# Patient Record
Sex: Female | Born: 1935 | Race: White | Hispanic: No | Smoking: Never smoker
Health system: Southern US, Community
[De-identification: ages and names within clinical notes are randomized; demographics above are authoritative.]

## PROBLEM LIST (undated history)

## (undated) DIAGNOSIS — E669 Obesity, unspecified: Secondary | ICD-10-CM

## (undated) DIAGNOSIS — H3530 Unspecified macular degeneration: Secondary | ICD-10-CM

## (undated) DIAGNOSIS — N3281 Overactive bladder: Secondary | ICD-10-CM

## (undated) DIAGNOSIS — C50919 Malignant neoplasm of unspecified site of unspecified female breast: Secondary | ICD-10-CM

## (undated) DIAGNOSIS — I10 Essential (primary) hypertension: Secondary | ICD-10-CM

## (undated) DIAGNOSIS — E538 Deficiency of other specified B group vitamins: Principal | ICD-10-CM

## (undated) DIAGNOSIS — E611 Iron deficiency: Principal | ICD-10-CM

## (undated) DIAGNOSIS — C801 Malignant (primary) neoplasm, unspecified: Secondary | ICD-10-CM

## (undated) DIAGNOSIS — N189 Chronic kidney disease, unspecified: Secondary | ICD-10-CM

## (undated) DIAGNOSIS — E119 Type 2 diabetes mellitus without complications: Secondary | ICD-10-CM

## (undated) HISTORY — DX: Type 2 diabetes mellitus without complications: E11.9

## (undated) HISTORY — DX: Unspecified macular degeneration: H35.30

## (undated) HISTORY — DX: Essential (primary) hypertension: I10

## (undated) HISTORY — DX: Overactive bladder: N32.81

## (undated) HISTORY — DX: Obesity, unspecified: E66.9

## (undated) HISTORY — PX: OTHER SURGICAL HISTORY: SHX169

## (undated) HISTORY — DX: Chronic kidney disease, unspecified: N18.9

## (undated) HISTORY — PX: REPLACEMENT TOTAL KNEE BILATERAL: SUR1225

## (undated) HISTORY — DX: Deficiency of other specified B group vitamins: E53.8

## (undated) HISTORY — DX: Iron deficiency: E61.1

## (undated) HISTORY — DX: Malignant neoplasm of unspecified site of unspecified female breast: C50.919

## (undated) HISTORY — PX: SKIN CANCER EXCISION: SHX779

## (undated) HISTORY — DX: Malignant (primary) neoplasm, unspecified: C80.1

---

## 2000-06-12 ENCOUNTER — Encounter: Payer: Self-pay | Admitting: Family Medicine

## 2000-06-12 ENCOUNTER — Ambulatory Visit (HOSPITAL_COMMUNITY): Admission: RE | Admit: 2000-06-12 | Discharge: 2000-06-12 | Payer: Self-pay | Admitting: Family Medicine

## 2000-09-01 ENCOUNTER — Other Ambulatory Visit: Admission: RE | Admit: 2000-09-01 | Discharge: 2000-09-01 | Payer: Self-pay | Admitting: Family Medicine

## 2001-02-24 ENCOUNTER — Encounter: Payer: Self-pay | Admitting: *Deleted

## 2001-03-02 ENCOUNTER — Inpatient Hospital Stay (HOSPITAL_COMMUNITY): Admission: RE | Admit: 2001-03-02 | Discharge: 2001-03-06 | Payer: Self-pay | Admitting: *Deleted

## 2001-03-30 ENCOUNTER — Encounter: Admission: RE | Admit: 2001-03-30 | Discharge: 2001-04-20 | Payer: Self-pay | Admitting: *Deleted

## 2001-06-14 ENCOUNTER — Encounter: Payer: Self-pay | Admitting: Family Medicine

## 2001-06-14 ENCOUNTER — Ambulatory Visit (HOSPITAL_COMMUNITY): Admission: RE | Admit: 2001-06-14 | Discharge: 2001-06-14 | Payer: Self-pay | Admitting: Family Medicine

## 2002-07-20 ENCOUNTER — Ambulatory Visit (HOSPITAL_COMMUNITY): Admission: RE | Admit: 2002-07-20 | Discharge: 2002-07-20 | Payer: Self-pay | Admitting: Family Medicine

## 2002-07-20 ENCOUNTER — Encounter: Payer: Self-pay | Admitting: Family Medicine

## 2003-04-04 ENCOUNTER — Inpatient Hospital Stay (HOSPITAL_COMMUNITY): Admission: RE | Admit: 2003-04-04 | Discharge: 2003-04-09 | Payer: Self-pay | Admitting: Orthopedic Surgery

## 2003-05-09 ENCOUNTER — Encounter: Admission: RE | Admit: 2003-05-09 | Discharge: 2003-06-07 | Payer: Self-pay | Admitting: Orthopedic Surgery

## 2003-08-08 ENCOUNTER — Ambulatory Visit (HOSPITAL_COMMUNITY): Admission: RE | Admit: 2003-08-08 | Discharge: 2003-08-08 | Payer: Self-pay | Admitting: Family Medicine

## 2004-08-13 ENCOUNTER — Ambulatory Visit (HOSPITAL_COMMUNITY): Admission: RE | Admit: 2004-08-13 | Discharge: 2004-08-13 | Payer: Self-pay | Admitting: Family Medicine

## 2004-08-28 ENCOUNTER — Ambulatory Visit (HOSPITAL_COMMUNITY): Admission: RE | Admit: 2004-08-28 | Discharge: 2004-08-28 | Payer: Self-pay | Admitting: Family Medicine

## 2005-08-29 ENCOUNTER — Ambulatory Visit (HOSPITAL_COMMUNITY): Admission: RE | Admit: 2005-08-29 | Discharge: 2005-08-29 | Payer: Self-pay | Admitting: Family Medicine

## 2005-09-05 ENCOUNTER — Encounter (INDEPENDENT_AMBULATORY_CARE_PROVIDER_SITE_OTHER): Payer: Self-pay | Admitting: Specialist

## 2005-09-05 ENCOUNTER — Encounter (INDEPENDENT_AMBULATORY_CARE_PROVIDER_SITE_OTHER): Payer: Self-pay | Admitting: Diagnostic Radiology

## 2005-09-05 ENCOUNTER — Encounter: Admission: RE | Admit: 2005-09-05 | Discharge: 2005-09-05 | Payer: Self-pay | Admitting: Family Medicine

## 2005-09-17 ENCOUNTER — Encounter: Admission: RE | Admit: 2005-09-17 | Discharge: 2005-09-17 | Payer: Self-pay | Admitting: Family Medicine

## 2005-10-15 ENCOUNTER — Encounter: Admission: RE | Admit: 2005-10-15 | Discharge: 2005-10-15 | Payer: Self-pay | Admitting: General Surgery

## 2005-10-20 ENCOUNTER — Encounter (INDEPENDENT_AMBULATORY_CARE_PROVIDER_SITE_OTHER): Payer: Self-pay | Admitting: General Surgery

## 2005-10-20 ENCOUNTER — Encounter (INDEPENDENT_AMBULATORY_CARE_PROVIDER_SITE_OTHER): Payer: Self-pay | Admitting: Specialist

## 2005-10-20 ENCOUNTER — Encounter: Admission: RE | Admit: 2005-10-20 | Discharge: 2005-10-20 | Payer: Self-pay | Admitting: General Surgery

## 2005-10-20 ENCOUNTER — Ambulatory Visit (HOSPITAL_BASED_OUTPATIENT_CLINIC_OR_DEPARTMENT_OTHER): Admission: RE | Admit: 2005-10-20 | Discharge: 2005-10-20 | Payer: Self-pay | Admitting: General Surgery

## 2005-11-11 ENCOUNTER — Encounter (HOSPITAL_COMMUNITY): Admission: RE | Admit: 2005-11-11 | Discharge: 2005-12-11 | Payer: Self-pay | Admitting: Oncology

## 2005-11-11 ENCOUNTER — Encounter: Admission: RE | Admit: 2005-11-11 | Discharge: 2005-11-11 | Payer: Self-pay | Admitting: Oncology

## 2005-11-11 ENCOUNTER — Ambulatory Visit (HOSPITAL_COMMUNITY): Payer: Self-pay | Admitting: Oncology

## 2005-12-10 ENCOUNTER — Ambulatory Visit (HOSPITAL_COMMUNITY): Admission: RE | Admit: 2005-12-10 | Discharge: 2005-12-10 | Payer: Self-pay | Admitting: Radiation Oncology

## 2006-05-13 ENCOUNTER — Ambulatory Visit (HOSPITAL_COMMUNITY): Payer: Self-pay | Admitting: Oncology

## 2006-06-29 ENCOUNTER — Emergency Department (HOSPITAL_COMMUNITY): Admission: EM | Admit: 2006-06-29 | Discharge: 2006-06-29 | Payer: Self-pay | Admitting: Emergency Medicine

## 2006-07-07 ENCOUNTER — Ambulatory Visit (HOSPITAL_COMMUNITY): Admission: RE | Admit: 2006-07-07 | Discharge: 2006-07-07 | Payer: Self-pay | Admitting: Family Medicine

## 2006-07-27 ENCOUNTER — Ambulatory Visit (HOSPITAL_COMMUNITY): Admission: RE | Admit: 2006-07-27 | Discharge: 2006-07-27 | Payer: Self-pay | Admitting: Family Medicine

## 2006-08-31 ENCOUNTER — Encounter: Admission: RE | Admit: 2006-08-31 | Discharge: 2006-08-31 | Payer: Self-pay | Admitting: Family Medicine

## 2006-11-11 ENCOUNTER — Ambulatory Visit (HOSPITAL_COMMUNITY): Payer: Self-pay | Admitting: Oncology

## 2006-11-11 ENCOUNTER — Encounter (HOSPITAL_COMMUNITY): Admission: RE | Admit: 2006-11-11 | Discharge: 2006-12-11 | Payer: Self-pay | Admitting: Oncology

## 2007-05-05 ENCOUNTER — Ambulatory Visit (HOSPITAL_COMMUNITY): Payer: Self-pay | Admitting: Oncology

## 2007-09-06 ENCOUNTER — Encounter (HOSPITAL_COMMUNITY): Admission: RE | Admit: 2007-09-06 | Discharge: 2007-10-06 | Payer: Self-pay | Admitting: Oncology

## 2007-10-01 ENCOUNTER — Encounter: Admission: RE | Admit: 2007-10-01 | Discharge: 2007-10-01 | Payer: Self-pay | Admitting: General Surgery

## 2007-11-03 ENCOUNTER — Encounter (HOSPITAL_COMMUNITY): Admission: RE | Admit: 2007-11-03 | Discharge: 2007-12-03 | Payer: Self-pay | Admitting: Oncology

## 2007-11-03 ENCOUNTER — Ambulatory Visit (HOSPITAL_COMMUNITY): Payer: Self-pay | Admitting: Oncology

## 2008-04-21 ENCOUNTER — Ambulatory Visit (HOSPITAL_BASED_OUTPATIENT_CLINIC_OR_DEPARTMENT_OTHER): Admission: RE | Admit: 2008-04-21 | Discharge: 2008-04-21 | Payer: Self-pay | Admitting: Urology

## 2008-05-03 ENCOUNTER — Encounter (HOSPITAL_COMMUNITY): Admission: RE | Admit: 2008-05-03 | Discharge: 2008-06-02 | Payer: Self-pay | Admitting: Oncology

## 2008-05-03 ENCOUNTER — Ambulatory Visit (HOSPITAL_COMMUNITY): Payer: Self-pay | Admitting: Oncology

## 2008-09-07 ENCOUNTER — Ambulatory Visit (HOSPITAL_COMMUNITY): Admission: RE | Admit: 2008-09-07 | Discharge: 2008-09-07 | Payer: Self-pay | Admitting: Oncology

## 2008-11-01 ENCOUNTER — Ambulatory Visit (HOSPITAL_COMMUNITY): Payer: Self-pay | Admitting: Oncology

## 2009-04-19 ENCOUNTER — Ambulatory Visit (HOSPITAL_BASED_OUTPATIENT_CLINIC_OR_DEPARTMENT_OTHER): Admission: RE | Admit: 2009-04-19 | Discharge: 2009-04-20 | Payer: Self-pay | Admitting: Urology

## 2009-05-22 ENCOUNTER — Ambulatory Visit (HOSPITAL_COMMUNITY): Payer: Self-pay | Admitting: Internal Medicine

## 2009-09-12 ENCOUNTER — Ambulatory Visit (HOSPITAL_COMMUNITY): Admission: RE | Admit: 2009-09-12 | Discharge: 2009-09-12 | Payer: Self-pay | Admitting: Family Medicine

## 2009-11-20 ENCOUNTER — Ambulatory Visit (HOSPITAL_COMMUNITY): Payer: Self-pay | Admitting: Oncology

## 2009-11-20 ENCOUNTER — Encounter (HOSPITAL_COMMUNITY): Admission: RE | Admit: 2009-11-20 | Payer: Self-pay | Admitting: Oncology

## 2010-04-03 LAB — POCT I-STAT 4, (NA,K, GLUC, HGB,HCT)
HCT: 43 % (ref 36.0–46.0)
Hemoglobin: 14.6 g/dL (ref 12.0–15.0)
Potassium: 4.5 mEq/L (ref 3.5–5.1)
Sodium: 142 mEq/L (ref 135–145)

## 2010-04-03 LAB — HEMOGLOBIN AND HEMATOCRIT, BLOOD: HCT: 33.6 % — ABNORMAL LOW (ref 36.0–46.0)

## 2010-04-03 LAB — GLUCOSE, CAPILLARY
Glucose-Capillary: 154 mg/dL — ABNORMAL HIGH (ref 70–99)
Glucose-Capillary: 243 mg/dL — ABNORMAL HIGH (ref 70–99)

## 2010-04-24 LAB — POCT I-STAT 4, (NA,K, GLUC, HGB,HCT)
Glucose, Bld: 128 mg/dL — ABNORMAL HIGH (ref 70–99)
Hemoglobin: 14.3 g/dL (ref 12.0–15.0)
Potassium: 4.3 mEq/L (ref 3.5–5.1)
Sodium: 142 mEq/L (ref 135–145)

## 2010-04-24 LAB — HEMATOCRIT: HCT: 34.7 % — ABNORMAL LOW (ref 36.0–46.0)

## 2010-05-21 ENCOUNTER — Other Ambulatory Visit (HOSPITAL_COMMUNITY): Payer: Self-pay | Admitting: Oncology

## 2010-05-21 ENCOUNTER — Encounter (HOSPITAL_COMMUNITY): Payer: Medicare Other | Attending: Oncology

## 2010-05-21 DIAGNOSIS — R160 Hepatomegaly, not elsewhere classified: Secondary | ICD-10-CM | POA: Insufficient documentation

## 2010-05-21 DIAGNOSIS — Z79899 Other long term (current) drug therapy: Secondary | ICD-10-CM | POA: Insufficient documentation

## 2010-05-21 DIAGNOSIS — C50919 Malignant neoplasm of unspecified site of unspecified female breast: Secondary | ICD-10-CM | POA: Insufficient documentation

## 2010-05-21 DIAGNOSIS — E7800 Pure hypercholesterolemia, unspecified: Secondary | ICD-10-CM | POA: Insufficient documentation

## 2010-05-21 DIAGNOSIS — I10 Essential (primary) hypertension: Secondary | ICD-10-CM | POA: Insufficient documentation

## 2010-05-21 LAB — COMPREHENSIVE METABOLIC PANEL
ALT: 64 U/L — ABNORMAL HIGH (ref 0–35)
Alkaline Phosphatase: 78 U/L (ref 39–117)
BUN: 29 mg/dL — ABNORMAL HIGH (ref 6–23)
CO2: 25 mEq/L (ref 19–32)
GFR calc non Af Amer: 48 mL/min — ABNORMAL LOW (ref >60)
Glucose, Bld: 143 mg/dL — ABNORMAL HIGH (ref 70–99)
Potassium: 4.2 mEq/L (ref 3.5–5.1)
Total Protein: 8 g/dL (ref 6.0–8.3)

## 2010-05-21 LAB — CBC
HCT: 37.1 % (ref 36.0–46.0)
Hemoglobin: 12.4 g/dL (ref 12.0–15.0)
MCHC: 33.4 g/dL (ref 30.0–36.0)
MCV: 95.6 fL (ref 78.0–100.0)
RDW: 14.2 % (ref 11.5–15.5)
WBC: 9.8 10*3/uL (ref 4.0–10.5)

## 2010-05-23 ENCOUNTER — Other Ambulatory Visit: Payer: Self-pay | Admitting: Oncology

## 2010-05-23 DIAGNOSIS — C799 Secondary malignant neoplasm of unspecified site: Secondary | ICD-10-CM

## 2010-05-23 DIAGNOSIS — R52 Pain, unspecified: Secondary | ICD-10-CM

## 2010-05-23 DIAGNOSIS — R748 Abnormal levels of other serum enzymes: Secondary | ICD-10-CM

## 2010-05-23 DIAGNOSIS — R110 Nausea: Secondary | ICD-10-CM

## 2010-05-23 DIAGNOSIS — C50919 Malignant neoplasm of unspecified site of unspecified female breast: Secondary | ICD-10-CM

## 2010-05-28 ENCOUNTER — Ambulatory Visit (HOSPITAL_COMMUNITY)
Admission: RE | Admit: 2010-05-28 | Discharge: 2010-05-28 | Disposition: A | Discharge status: Home / Self Care | Payer: Medicare Other | Source: Ambulatory Visit | Attending: Oncology | Admitting: Oncology

## 2010-05-28 DIAGNOSIS — K838 Other specified diseases of biliary tract: Secondary | ICD-10-CM | POA: Insufficient documentation

## 2010-05-28 DIAGNOSIS — R110 Nausea: Secondary | ICD-10-CM

## 2010-05-28 DIAGNOSIS — R160 Hepatomegaly, not elsewhere classified: Secondary | ICD-10-CM | POA: Insufficient documentation

## 2010-05-28 DIAGNOSIS — C50919 Malignant neoplasm of unspecified site of unspecified female breast: Secondary | ICD-10-CM

## 2010-05-28 DIAGNOSIS — K7689 Other specified diseases of liver: Secondary | ICD-10-CM | POA: Insufficient documentation

## 2010-05-28 DIAGNOSIS — R748 Abnormal levels of other serum enzymes: Secondary | ICD-10-CM | POA: Insufficient documentation

## 2010-05-28 NOTE — Op Note (Signed)
NAME:  Anna Dickerson, Anna Dickerson            ACCOUNT NO.:  [id number]   MEDICAL RECORD NO.:  [id number]          PATIENT TYPE:  AMB   LOCATION:  [facility]                         FACILITY:  [facility]   PHYSICIAN:  Sigmund I. Gaynelle Arabian, M.D.DATE OF BIRTH:  November 20, 1929   DATE OF PROCEDURE:  04/21/2008  DATE OF DISCHARGE:                               OPERATIVE REPORT   PREOPERATIVE DIAGNOSIS:  Pelvic floor prolapse.   POSTOPERATIVE DIAGNOSIS:  Pelvic floor prolapse.   PROCEDURE PERFORMED:  1. Cystourethroscopy, bilateral retrograde pyelogram, bilateral      externalized urethral stent placement.  2. Suprapubic tube placement.  3. Pelvic floor reconstruction using [employer].  4. Cystourethroscopy and injection of Macroplastique.   SURGEON:  Sigmund I. Gaynelle Arabian, M.D.   ASSISTANT:  1. Simmie Davies, MD.  2. Franco Collet, NP.   ANESTHESIA:  General.   DRAINS:  Bonanno suprapubic tube.   PACKING:  Two vaginal packs with Estrace cream.   ESTIMATED BLOOD LOSS:  About 300 mL.   INDICATIONS FOR PROCEDURE:  The patient is a 77 year old female who was  evaluated by Pierre Bali I. Gaynelle Arabian, M.D. in the urology clinic for  complaints of pelvic floor prolapse.  The different treatment options  were discussed.  The patient opted for pelvic floor reconstruction.  The  risks and benefits of the procedure were explained and informed consent  obtained.   DESCRIPTION OF PROCEDURE IN DETAIL:  The patient was brought to the  operating room.  Placed in the supine position.  Administered general  anesthesia by the anesthesia team.  A proper time-out performed  confirming the patient, procedure and the site.  The patient was  subsequently placed in the dorsal lithotomy position.  The pressure  points were well-padded.  Bilateral lower extremity SCDs were applied.  The patient was prepped and draped in the usual sterile manner.  We  began the procedure by performing cystourethroscopy.  Of  note, the  patient had severe pelvic floor prolapse with cervix at the introitus.  On performing cystourethroscopy, given no abnormalities noticed in the  bladder, the right and left ureteral orifices were identified and end-  hole catheters were advanced and retrograde pyelogram done bilaterally.  There was no abnormality noted on the retrograde pyelogram.  Under  fluoroscopy a 5-French end-hole catheter was advanced into the right  renal pelvis and a 6-French end-hole catheter into the left renal  pelvis.  We then placed a Foley catheter.  Of note, that we then under  endoscopic vision placed a Bonanno suprapubic tube.  This was secured at  the skin level using 3-0 nylon suture.  We then removed the cystoscope  and placed a 16-French Foley catheter.  We secured the end-hole catheter  to the Foley catheter.  We then applied [facility] retractor and exposed  the vaginal mucosa.  We made an inverted V incision proximal to the mid  urethra.  We then raised the vaginal flaps using Strully scissors.  With  blunt and sharp dissection we were able to reduce the cystocele and  laterally we were able to dissect up to the ischial spine  bilaterally.  We then used 2-0 Vicryl suture and performed Kelly plication across  approximating the pubocervical fascia.  We then used Capio device and  placed the sutures across the sacrospinous ligaments bilaterally.  We  then secured the mesh in the midline around the cystocele by using 2-0  Vicryl suture.  These were secured proximally and distally at three  different places.  We then tightened the kit around the sacrospinous  ligament.  We then released the plastic covering over the mesh.  We then  trimmed the excess vaginal mucosa and repaired the vaginal mucosa using  2-0 Vicryl suture in a running manner.  Of note, that we had secured  hemostasis with where applicable using Bovie electrocautery and 2-0  Vicryl suture.  At the time of closure no active  bleeding was seen.  Of  note, we had placed Avitene into the wound before closing the vaginal  mucosa.  We then placed two vaginal packs with Estrace cream into the  vagina.  We then again performed cystourethroscopy after removing the  Foley catheter and bilateral externalized stents.  Under endoscopic  vision we then injected Macroplastique injection 1 cm distal to the  bladder neck area between 5 o'clock and 7 o'clock positions.  This  marked the end of the procedure.  The patient's bladder was left to  straight drain through the suprapubic tube.  The patient was  subsequently extubated and transferred in stable condition to the  recovery room.   Of note, Sigmund I. Gaynelle Arabian, M.D. was present for entirety of the  case.     ______________________________  Simmie Davies, MD      Sigmund I. Gaynelle Arabian, M.D.  Electronically Signed    JJ/MEDQ  D:  04/21/2008  T:  04/21/2008  Job:  [id number]

## 2010-05-31 NOTE — Op Note (Signed)
[place]  Patient:    Anna Dickerson, Anna Dickerson Visit Number: [id number] MRN: [id number]          Service Type: SUR Location: RCRM 2550 [id number] Attending Physician:  Marlon Pel Dictated by:   Hedda Slade, M.D. Admit Date:  03/02/2001                             Operative Report  PREOPERATIVE DIAGNOSIS:  Bilateral osteoarthritis of knees with varus.  POSTOPERATIVE DIAGNOSIS:  Bilateral osteoarthritis of knees with varus.  OPERATIVE PROCEDURE:  Left total knee arthroplasty, Osteonics type, with cementing of all components.  ANESTHESIA:  General by endotracheal tube.  SURGEON:  Hedda Slade, M.D.  ASSISTANT:  Meredith Pel, M.D.  [place]:  An IV was started.  The patient was given 1 g of Ancef IV.  She was then brought to the operating room, placed in a comfortable position, all areas padded, a roll put under the left hip.  She was given general anesthetic by endotracheal tube.  In her IV she had already been given 1 g of Ancef.  A Foley catheter was put in place.  A proximal pneumatic tourniquet was applied then isolated with U-drape and she was prepped from the edges of the drape to her toes with DuraPrep then draped in the usual manner, this including two Iobans.  A straight, slightly oblique incision was made proximal to distal, ending up just medial to the patellar tendon.  The quadriceps tendon was cut, leaving a small medial rim, and the retinaculum opened medially and this incision continued down to along the medial patellar tendon, thus allowing the patella to be everted.  But prior to that, the lateral patellofemoral ligament was divided, along with a couple of bands of synovium, then the patella everted, dislocated laterally, the knee flexed.  A huge volume of osteophytes was removed from the distal femur.  A large part of the fat pad was removed.  Subperiosteally, the capsule in the medial ligaments  was elevated, then removed posteriorly to the semimembranosus, and while doing this I also did a medial meniscectomy.  Large osteophytes were removed from the peripheral medially.  Attention was then directed laterally.  Likewise, this was sharply exposed. The lateral meniscus was excised; the popliteus was preserved.  At this point the distal femur was resected 12 mm in 5 degrees of valgus using intermedullary guide.  The cruciate ligaments were sacrificed and removed.  The distal femur was then measured to be a size 9.  We then used the guides in the transepicondylar line to orient the cutting block in 3 to 3.5 degrees of external rotation.  Using the cutting block, all the chamfers were cut.  We then used the block to cut the femoral notch and at this point used some pieces of chamfer bone to put in the intermedullary rod hole to block some bone bleeding.  The tibia was subluxed forward.  We sized it to be #7.  We then used the intermedullary rod and resection guide to resect 10 mm from the high or lateral tibial plateau with 5 degree posterior tilt.  This was oriented anterior to posterior.  Having then removed the proximal tibia, a large amount of osteophytes were removed from the popliteal area and from back of the femur.  Using guides and trials we determined that the #9 femoral component, the #7 tibial tray, and a  12 mm bearing insert were indicated.  This allowed full flexion, extension, and excellent stability in flexion and extension.  We then marked the rotation of the tibial tray and with that and the tibial guide, we used the impacting fin to accept the tibial tray fin.  We used a pulsatile lavage, copiously irrigated the area.  The patella measured 25 mm in thickness.  We resected it to about 12 mm in thickness and determined that size #7 patellar button was adequate, then using the guide, the patella was centered, being slightly medialized.  Peg holes were drilled  and at this point we were ready to cement the components.  The area was completely pulsatile lavaged again.  A #9 femoral component was well cemented all the way around, as was the #7 tibial tray.  The trial tibial bearing was put in place and the knee extended while this cement set, and attention was directed to the patella.  Cement was pressed into the cancellous bone and applied to the prosthesis, then clamped completely reduced.  Excess cement was removed and we just spent time looking for bits of debris and pulsatile lavaged until all the cement hardened.  At this point then we removed the tibial bearing, sought out and removed bits of excess and loose cement, and when we were confident that all debris and cement was removed, we again pulsatile lavaged then put in the permanent 12 mm thick tibial bearing insert that locked into position securely.  This allowed excellent flexion, full extension, and varus-valgus stability.  The patella tracked very nicely without any excess tension throughout.  With the tourniquet down, there was a branch of the medial inferior geniculate artery which was bleeding.  This was Bovie cauterized.  A couple of veins were likewise Bovie cauterized and otherwise the wound was quite dry.  We then closed the retinaculum using figure-of-eight sutures of #1 Vicryl.  Then, in the more superficial layers use 2-0 Vicryl and on the skin used metal staples, then applied Xeroform, 4x4s, ABDs, and Kerlix, followed by Ace wrap.  One suction Hemovac was placed superficial to the retinaculum and it was brought out superolaterally through a skin puncture.  Knee immobilizer was applied.  At that point the anesthesiologist was ready to do a femoral block.  Estimated blood loss during this procedure was perhaps 300 cc; none was replaced. Dictated by:   Hedda Slade, M.D. Attending Physician:  Marlon Pel DD:  03/02/01 TD:  03/02/01 Job: [id number]

## 2010-05-31 NOTE — H&P (Signed)
[place]  Patient:    Anna Dickerson, Anna Dickerson Visit Number: [id number] MRN: [id number]          Service Type: Attending:  Hedda Slade, M.D. Dictated by:   Hedda Slade, M.D. Adm. Date:  03/02/01                           History and Physical  INTRODUCTION:  Ms. [place] is a 77 year old lady with bilateral painful knees, left much greater than right.  This has been progressive over years and at this point she relates that she is getting to where "I can hardly walk."  As an outpatient she was evaluated, found to have severe varus deformity, with posterolateral thrust and limited motion, and found to be a good candidate for total knee arthroplasty.  This was fully discussed with her.  We discussed problems with infection, pain, phlebitis, instability, and the fact of no guarantees, but a good chance she will do well.  She would like to proceed on with, initially, left total knee arthroplasty.  MEDICATIONS: 1. She was on an aspirin a day and Voltaren, but she has discontinued that in    preparation for surgery. 2. Capoten 2 mg a day. 3. Hydrochlorothiazide 25 mg one q.d. 4. Lorazepam 0.5 g one q.4h. 5. Lipitor 20 mg q.d. 6. Paxil 20 mg one q.d.  PAST SURGICAL HISTORY:  She had a left knee arthroscopic meniscectomy.  FAMILY HISTORY:  Positive for cancer and high blood pressure.  REVIEW OF SYSTEMS:  Patient has high blood pressure.  She relates she has no significant cardiorespiratory, GI, or GU symptoms.  She had last Pap smear and mammogram in June 2002.  PHYSICAL EXAMINATION:  VITAL SIGNS:  Temperature 99.3, pulse 82, respirations 19, blood pressure 180/88.  HEENT:  She is wearing eyeglasses.  Extraocular movements full.  She has an upper denture, nine lower teeth.  The mucosa appears normal.  Tympanic membranes are both occluded with cerumen.  NECK:  Moves freely without apparent pain.  There are no palpable masses. There are no  carotid bruits.  CHEST:  Decreased expansion, breath sounds clear.  HEART:  Regular rhythm, distant heart sounds, no murmur heard.  ABDOMEN:  Round, soft, nontender.  No palpable masses.  Bowel sounds are normal.  ORTHOPEDIC:  Has good dorsalis pedis pulse, good femoral pulse without bruit. Hips move freely without pain.  She walks with varus knees and posterolateral thrust.  The left knee motion is from about 15 to 90 degrees.  There is crepitation.  There is pain and tenderness medially.  There is 1-2+ laxity.  SKIN:  The skin is in good condition.  LABORATORY DATA:  Standing AP x-rays show varus deformity bilaterally, left greater than right in that there are subchondral cysts more left than right. Overall would appear to be a fairly good quality bone.  ADMITTING DIAGNOSES: 1. Bilateral osteoarthritis of the knees with varus deformity, relatively good    bone. 2. Hypertension. 3. Anxiety.  PLAN:  Left total knee arthroplasty with cementing of components.  I have fully discussed this with her.  She seems to understand and agree. Dictated by:   Hedda Slade, M.D. Attending:  Hedda Slade, M.D. DD:  02/24/01 TD:  02/24/01 Job: [id number]

## 2010-05-31 NOTE — Discharge Summary (Signed)
[place]  Patient:    Anna Dickerson, Anna Dickerson Visit Number: [id number] MRN: [id number]          Service Type: SUR Location: [id number] Attending Physician:  Marlon Pel Dictated by:   Hedda Slade, M.D. Admit Date:  03/02/2001 Discharge Date: 03/06/2001                             Discharge Summary  ADMITTING DIAGNOSIS:  Osteoarthritis with varus bilaterally.  DISCHARGE DIAGNOSIS:  Osteoarthritis with varus bilaterally.  OPERATIVE PROCEDURE:  March 02, 2001, left total knee arthroplasty using cemented Scorpio components, #9 femoral component, #7 tibial tray, 12-mm bearing insert, #7 patellar resurfacing component.  HISTORY OF PRESENT ILLNESS:  For history and physical, see that dictated on admission.  HOSPITAL COURSE:  On the date of admission, patient was taken to the operating room where her surgery was carried out as detailed in the operative note.  She was given preoperative, intraoperative and postoperative IV antibiotics.  She was started postoperatively on Coumadin protocol and early motion.  Her postoperative course was uneventful and she was discharged to home physical therapy on March 06, 2001.  She was 50% weightbearing.  Her INR was 1.7 with a goal of 2 and her last hemoglobin was 10.8.  While at home, she was to do frequent ankle pumps, quad sets, motion with physical therapy and by herself, take Tylox or Vicodin for pain and see me approximately 10 days postoperative.  LABORATORY AND ACCESSORY DATA:  Some of the laboratory data obtained during this admission:  Admitting hemoglobin was 14.1, discharge hemoglobin 10.8. EKG:  Normal sinus rhythm.  Routine electrolytes normal.  Routine urinalysis normal.  Blood type was O-positive. Dictated by:   Hedda Slade, M.D. Attending Physician:  Marlon Pel DD:  03/23/01 TD:  03/23/01 Job: [id number]

## 2010-05-31 NOTE — Op Note (Signed)
NAME:  Anna Dickerson, Anna Dickerson                      ACCOUNT NO.:  [id number]   MEDICAL RECORD NO.:  [id number]                   PATIENT TYPE:  INP   LOCATION:  5007                                 FACILITY:  [facility]   PHYSICIAN:  Anderson Malta, M.D.                 DATE OF BIRTH:  1929-05-04   DATE OF PROCEDURE:  04/04/2003  DATE OF DISCHARGE:                                 OPERATIVE REPORT   PREOPERATIVE DIAGNOSIS:  Right knee arthritis.   POSTOPERATIVE DIAGNOSIS:  Right knee arthritis.   PROCEDURE:  Right total knee replacement.   SURGEON:  Anderson Malta, M.D.   ASSISTANT:  Elayne Guerin, R.N., G.N.P.   ANESTHESIA:  General endotracheal plus preoperative femoral nerve block.   ESTIMATED BLOOD LOSS:  150 cc.   DRAINS:  None.   TOURNIQUET TIME:  Approximately one hour and 45 minutes at 350 mmHg.   COMPONENTS:  9 femur posterior stabilized cemented, 9 tibia cemented, 12.5  poly cross link poly and 7 patella cemented.   PROCEDURE IN DETAIL:  The patient was brought to the operating room where  general endotracheal anesthesia was induced.  Preoperative IV antibiotics  were administered.  The right leg and foot and knee were prepped with  Duraprep solution and draped in a sterile manner.  Charlie Pitter was used to cover  the operative field.  The leg was elevated and exsanguinated with the  Esmarch.  The tourniquet was inflated.  With the knee over a small bolster,  an anterior and longitudinal incision was made.  The skin and subcutaneous  tissues were sharply divided.  The median parapatellar approach was made.  The precise location of the median parapatellar arthrotomy was marked at the  superomedial aspect of the patella with the #1 Vicryl suture.  The fat pad  was partially excised from true visualization.  The lateral patellofemoral  ligament was removed.  Osteophytes were removed from the median and lateral  edge of the distal femur.  Soft tissue was removed from the anterior  distal  aspect of the femur for visualization of the anterior cut.  At this time,  the patella was everted and the knee was flexed.  Osteophytes were removed  peripherally from the tibial plateau.  The MCL was partially elevated for  ligament balancing.  At this time, the ACL and PCL were released.  Intramedullary alignment guide was placed.  The distal femoral cut of 12 mm  was made.  The external rotation sizing guide was then placed.  The external  rotation was placed parallel to the epicondylar axis.  The size 9 was deemed  to not notch the femoral cortex.  The chamfer cutting guide was placed and  the anterior, posterior and chamfer cuts were then made with the collateral  ligaments well protected.  At this time, a leveling cut on the tibia was  made.  The tibia was subluxated forward with the  posterior retractor.  The  intramedullary alignment was then used to make the tibial cut measuring 2 mm  off of the most affected medial side.  The resection was made.  The spacing  block was then placed in extension and flexion and found to be well balanced  with full extension, with slight hyperextension achievable.  At this time,  the box cut on the femur was made.  Remnant menisci and the PCL and ACL were  carefully resected.  The fissure osteophytes were then removed and soft  tissue was stripped off the posterior aspect of the knee carefully using a  blunt Cobb elevator.  At this time, the patella was repaired.  The patella  caliper measurement was 25 mm.  The 11 mm was resected and a size 7 patellar  button was noted, trial button was then placed after drilling of the three  pegs.  The trial components were placed.  Good patellar tracking was noted.  The knee achieved full extension with slight hyperextension and full flexion  with minimal lift off passed 100 degrees of flexion.  At this time, rotation  of the tibial component was marked and the tibial keel punch was performed.  At this  time, all trial components were removed.  The cut bony surfaces were  irrigated.  The components were then cemented into position beginning with  the 9 tibia, 9 femur and 7 patella.  Excess cement was removed.  The 10 and  12 poly trial was then placed and better stability was achieved with the 12  poly without sacrificing range of motion or patellar stability.  The 12 poly  was placed after cement hardening had occurred.  At this time, the  tourniquet was released.  Bleeding points encountered were controlled using  electrocautery.  The knee was then placed over the bolster and closed in  slight flexion using interrupted inverted #1 Ethibond suture followed by  interrupted, followed by 0 Vicryl interrupted suture to reapproximate the  deep tissue, interrupted inverted 2-0 Vicryl and then skin staples to  reapproximate the skin edges.  The patient was placed in a bulky dressing  and knee immobilizer.  She tolerated the procedure well without any  immediate complications.                                               Anderson Malta, M.D.    GSD/MEDQ  D:  04/05/2003  T:  04/06/2003  Job:  [id number]

## 2010-05-31 NOTE — Discharge Summary (Signed)
NAME:  Anna Dickerson, Anna Dickerson                      ACCOUNT NO.:  [id number]   MEDICAL RECORD NO.:  [id number]                   PATIENT TYPE:  INP   LOCATION:  5007                                 FACILITY:  [facility]   PHYSICIAN:  Anderson Malta, M.D.                 DATE OF BIRTH:  06-Nov-1929   DATE OF ADMISSION:  04/04/2003  DATE OF DISCHARGE:  04/09/2003                                 DISCHARGE SUMMARY   DISCHARGE DIAGNOSIS:  Right knee arthritis.   SECONDARY DIAGNOSIS:  Hypertension.   OPERATIONS AND NOTABLE PROCEDURES:  Right total knee replacement performed  April 04, 2003.   HOSPITAL COURSE:  Anna Dickerson is a 76 year old female with right knee  arthritis.  She underwent right total knee arthroplasty, April 04, 2003.  The patient tolerated the procedure well without immediate complication.  She was started on Coumadin for DVT prophylaxis and physical therapy for  mobilization.  The patient was also placed on a CPM machine to achieve knee  range of motion.  Foot dorsiflexion, plantarflexion, sensation and motion  were intact on postoperative day #1.  Hematocrit was 32 on postoperative day  #1.  The patient had an unremarkable recovery.  Incision was intact on  postoperative day #3.  She was mobilized and walking in the hallway by the  time of discharge.  She was discharged home in good condition on April 09, 2003.   FOLLOWUP:  She will follow up with me in 7 days.   DISCHARGE MEDICATIONS:  Discharge medications include Percocet, Robaxin and  Coumadin, as well as her preoperative medications.                                                Anderson Malta, M.D.    GSD/MEDQ  D:  04/27/2003  T:  04/28/2003  Job:  [id number]

## 2010-05-31 NOTE — Op Note (Signed)
NAME:  Anna Dickerson, Anna Dickerson            ACCOUNT NO.:  [id number]   MEDICAL RECORD NO.:  [id number]          PATIENT TYPE:  AMB   LOCATION:  [place]                          FACILITY:  [facility]   PHYSICIAN:  Rudell Cobb. Annamaria Boots, M.D.   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   OPERATION:  1. Blue dye injection.  2. Left partial mastectomy with needle localization and specimen      mammogram.  3. Left axillary sentinel lymph node biopsy.   SURGEON:  Rudell Cobb. Annamaria Boots, M.D.   ANESTHESIA:  General.   OPERATIVE PROCEDURE:  Prior to coming to the operating room, a localizing  wire had been placed in the medial portion of the left breast where the  lesion was.  In addition, 1 mCi of technetium sulfur colloid was injected  intradermally.   After suitable general anesthesia was induced, the patient was placed in the  supine position and the arms extended on the arm board.  Five milliliters of  a mixture of 2 mL of methylene blue and 3 mL of injectable saline was  injected in the subareolar breast tissue and the breast gently massaged for  3 minutes.  We then prepped and draped her in a standard fashion.   The wire and the lesion was in the medial portion of the left breast, so I  made a radial incision to incorporate the two wires that were placed by the  radiologist.  The incision was made and a wide excision of both wires was  carried out.  The specimen was oriented with sutures for the pathologist.  Specimen mammography was done and confirmed the removal of all the  calcifications.   The lumpectomy specimen was then submitted to the pathologist.   While that was all being done, a short transverse left axillary incision was  made with  dissection through the subcutaneous tissue to the clavipectoral  fascia.  Deep to the fascia were two blue and hot lymph nodes, which were  removed as sentinel lymph nodes.  They were was submitted to the  pathologist.  There were no other palpable or blue or hot nodes.   The specimen mammogram confirmed the removal of the lesion, and the sentinel  nodes were reported as negative for metastatic disease and the margins were  clear of tumor.   Both incisions were then injected with 0.25% Marcaine and closed in two  layers of 3-0 Vicryl and subcuticular 4-0 Monocryl and Steri-Strips.   Dressings were then applied and the patient transferred to the recovery room  in satisfactory condition, having tolerated procedure well.      Rudell Cobb. Annamaria Boots, M.D.  Electronically Signed     PRY/MEDQ  D:  10/20/2005  T:  10/21/2005  Job:  [id number]

## 2010-06-20 ENCOUNTER — Encounter (INDEPENDENT_AMBULATORY_CARE_PROVIDER_SITE_OTHER): Payer: Self-pay | Admitting: General Surgery

## 2010-07-09 ENCOUNTER — Ambulatory Visit (INDEPENDENT_AMBULATORY_CARE_PROVIDER_SITE_OTHER): Payer: Medicare Other | Admitting: Internal Medicine

## 2010-07-24 ENCOUNTER — Ambulatory Visit (INDEPENDENT_AMBULATORY_CARE_PROVIDER_SITE_OTHER): Payer: Medicare Other | Admitting: Internal Medicine

## 2010-07-24 DIAGNOSIS — K7689 Other specified diseases of liver: Secondary | ICD-10-CM

## 2010-09-18 ENCOUNTER — Ambulatory Visit (HOSPITAL_COMMUNITY): Admission: RE | Admit: 2010-09-18 | Payer: Medicare Other | Source: Ambulatory Visit

## 2010-09-25 ENCOUNTER — Ambulatory Visit (HOSPITAL_COMMUNITY)
Admission: RE | Admit: 2010-09-25 | Discharge: 2010-09-25 | Disposition: A | Discharge status: Home / Self Care | Payer: Medicare Other | Source: Ambulatory Visit | Attending: Oncology | Admitting: Oncology

## 2010-09-25 DIAGNOSIS — C50919 Malignant neoplasm of unspecified site of unspecified female breast: Secondary | ICD-10-CM

## 2010-09-25 DIAGNOSIS — Z853 Personal history of malignant neoplasm of breast: Secondary | ICD-10-CM | POA: Insufficient documentation

## 2010-10-14 LAB — CBC
MCV: 94.7
Platelets: 164
RBC: 4.05
WBC: 8.4

## 2010-10-14 LAB — COMPREHENSIVE METABOLIC PANEL
ALT: 42 — ABNORMAL HIGH
AST: 50 — ABNORMAL HIGH
Albumin: 3.5
Alkaline Phosphatase: 71
CO2: 28
Chloride: 107
Creatinine, Ser: 1.18
GFR calc Af Amer: 54 — ABNORMAL LOW
GFR calc non Af Amer: 45 — ABNORMAL LOW
Potassium: 4
Sodium: 140
Total Bilirubin: 0.4

## 2010-10-14 LAB — DIFFERENTIAL
Basophils Absolute: 0.1
Basophils Relative: 1
Eosinophils Absolute: 0.4
Eosinophils Relative: 4
Lymphocytes Relative: 35
Monocytes Absolute: 0.8

## 2010-10-23 LAB — CBC
HCT: 38.2
Platelets: 191
RDW: 12.1
WBC: 8

## 2010-10-23 LAB — DIFFERENTIAL
Basophils Absolute: 0.1
Basophils Relative: 1
Eosinophils Absolute: 0.3
Monocytes Absolute: 0.5
Monocytes Relative: 7
Neutro Abs: 4.3
Neutrophils Relative %: 55

## 2010-10-29 LAB — CBC
Platelets: 191
RDW: 12.8
WBC: 8.6

## 2010-10-29 LAB — PROTIME-INR
INR: 1.1
Prothrombin Time: 13.9

## 2010-10-29 LAB — BASIC METABOLIC PANEL
BUN: 21
Calcium: 9.2
GFR calc non Af Amer: 58 — ABNORMAL LOW
Glucose, Bld: 127 — ABNORMAL HIGH
Potassium: 4.3

## 2010-10-29 LAB — APTT: aPTT: 26

## 2010-11-11 ENCOUNTER — Encounter (INDEPENDENT_AMBULATORY_CARE_PROVIDER_SITE_OTHER): Payer: Self-pay | Admitting: General Surgery

## 2010-12-16 ENCOUNTER — Encounter (INDEPENDENT_AMBULATORY_CARE_PROVIDER_SITE_OTHER): Payer: Self-pay | Admitting: General Surgery

## 2010-12-16 ENCOUNTER — Ambulatory Visit (INDEPENDENT_AMBULATORY_CARE_PROVIDER_SITE_OTHER): Payer: Medicare Other | Admitting: General Surgery

## 2010-12-16 VITALS — BP 162/94 | HR 72 | Temp 97.0°F | Resp 16 | Ht 67.5 in | Wt 232.2 lb

## 2010-12-16 DIAGNOSIS — Z853 Personal history of malignant neoplasm of breast: Secondary | ICD-10-CM

## 2010-12-16 NOTE — Progress Notes (Signed)
Subjective:     Patient ID: Anna Dickerson, female   DOB: December 03, 1929, 77 y.o.   MRN: [id number]  HPI Anna Dickerson returns to see me for long-term followup regarding her left breast cancer.  On October 20, 2005 she underwent left partial mastectomy and sentinel lymph node biopsy. Final pathology report showed a pathologic stage T1a., N0, receptor positive.  She remains on tamoxifen and is scheduled to followup with Dr. Tressie Stalker in the near future. She is anticipating that he will take her off tamoxifen after 5 years. She continues to followup with her primary care physician, Dr. Emilee Hero regarding her medical problems, most notably hypertension.  Mammograms performed on September 25, 2010. Other than postlumpectomy changes they are normal, category 2.  Review of Systems 12 system review of systems is performed and is negative except as described above.    Objective:   Physical Exam Constitutional: patient is alert oriented and in no distress.  Neck:               no adenopathy. No mass. No venous distention.  Lungs:             Clear to auscultation bilaterally.  Breats:             bilateral breast exam reveals no mass, no skin changes other than well-healed scar medially a left, no axillary adenopathy. Nipple and areolar complexes are normal.  Skin:                 No rash. Warm and dry.    Assessment:     Invasive carcinoma left breast, pathologic stage T1a, N0, receptor positive.  No evidence of recurrence 5 years following left partial mastectomy and sentinel node biopsy.    Plan:     Annual mammography is advised and stressed.  Annual breast exam is advised and stressed.  We talked about long-term followup and the number of physicians that she would need to see. It was her decision and preference to get annual breast exam with Dr. Emilee Hero and with Dr. Tressie Stalker up in [place]. I will be happy to see her back any time if any new problems arise.

## 2010-12-16 NOTE — Patient Instructions (Signed)
Your breast exam today shows no evidence of any cancer. Your mammograms that were performed in September are normal. No sign of any abnormality. I advise that you get annual mammograms and that you have a breast exam annually. We talked about this and we decided that you would get your breast exam with Dr. Tressie Stalker and with Dr. Emilee Hero up in [place].  You may always return to see me if any new problems arise.

## 2010-12-31 ENCOUNTER — Encounter (HOSPITAL_COMMUNITY): Payer: Medicare Other | Attending: Oncology | Admitting: Oncology

## 2010-12-31 ENCOUNTER — Encounter (HOSPITAL_COMMUNITY): Payer: Self-pay | Admitting: Oncology

## 2010-12-31 DIAGNOSIS — K7689 Other specified diseases of liver: Secondary | ICD-10-CM | POA: Insufficient documentation

## 2010-12-31 DIAGNOSIS — C50919 Malignant neoplasm of unspecified site of unspecified female breast: Secondary | ICD-10-CM

## 2010-12-31 DIAGNOSIS — Z853 Personal history of malignant neoplasm of breast: Secondary | ICD-10-CM | POA: Insufficient documentation

## 2010-12-31 DIAGNOSIS — Z09 Encounter for follow-up examination after completed treatment for conditions other than malignant neoplasm: Secondary | ICD-10-CM | POA: Insufficient documentation

## 2010-12-31 DIAGNOSIS — K760 Fatty (change of) liver, not elsewhere classified: Secondary | ICD-10-CM

## 2010-12-31 DIAGNOSIS — I10 Essential (primary) hypertension: Secondary | ICD-10-CM

## 2010-12-31 DIAGNOSIS — E669 Obesity, unspecified: Secondary | ICD-10-CM

## 2010-12-31 DIAGNOSIS — H3530 Unspecified macular degeneration: Secondary | ICD-10-CM | POA: Insufficient documentation

## 2010-12-31 DIAGNOSIS — E119 Type 2 diabetes mellitus without complications: Secondary | ICD-10-CM

## 2010-12-31 DIAGNOSIS — I152 Hypertension secondary to endocrine disorders: Secondary | ICD-10-CM | POA: Insufficient documentation

## 2010-12-31 HISTORY — DX: Type 2 diabetes mellitus without complications: E11.9

## 2010-12-31 HISTORY — DX: Malignant neoplasm of unspecified site of unspecified female breast: C50.919

## 2010-12-31 HISTORY — DX: Essential (primary) hypertension: I10

## 2010-12-31 HISTORY — DX: Obesity, unspecified: E66.9

## 2010-12-31 HISTORY — DX: Unspecified macular degeneration: H35.30

## 2010-12-31 LAB — COMPREHENSIVE METABOLIC PANEL
AST: 77 U/L — ABNORMAL HIGH (ref 0–37)
Albumin: 3.7 g/dL (ref 3.5–5.2)
Alkaline Phosphatase: 78 U/L (ref 39–117)
BUN: 27 mg/dL — ABNORMAL HIGH (ref 6–23)
Chloride: 102 mEq/L (ref 96–112)
Potassium: 4.1 mEq/L (ref 3.5–5.1)
Sodium: 139 mEq/L (ref 135–145)
Total Bilirubin: 0.4 mg/dL (ref 0.3–1.2)
Total Protein: 7.8 g/dL (ref 6.0–8.3)

## 2010-12-31 LAB — CBC
HCT: 37.5 % (ref 36.0–46.0)
MCHC: 33.3 g/dL (ref 30.0–36.0)
Platelets: 194 10*3/uL (ref 150–400)
RDW: 13.3 % (ref 11.5–15.5)
WBC: 10.6 10*3/uL — ABNORMAL HIGH (ref 4.0–10.5)

## 2010-12-31 LAB — DIFFERENTIAL
Eosinophils Absolute: 0.3 10*3/uL (ref 0.0–0.7)
Lymphs Abs: 3.5 10*3/uL (ref 0.7–4.0)
Monocytes Absolute: 0.6 10*3/uL (ref 0.1–1.0)
Monocytes Relative: 6 % (ref 3–12)
Neutrophils Relative %: 58 % (ref 43–77)

## 2010-12-31 NOTE — Progress Notes (Signed)
Anna Hampshire, MD [place] [address]  1. Stage I left-sided Breast cancer  CBC, Differential, Comprehensive metabolic panel, CBC, CBC, Comprehensive metabolic panel, Comprehensive metabolic panel, Differential, Differential  2. Obesity    3. HTN (hypertension)    4. Macular degeneration    5. DM (diabetes mellitus)    6. Fatty infiltration of liver  CBC, Differential, Comprehensive metabolic panel, CBC, CBC, Comprehensive metabolic panel, Comprehensive metabolic panel, Differential, Differential    CURRENT THERAPY:  Completed 5 years worth of Tamoxifen December 2012.  S/P lumpectomy, followed her radiation therapy was surgery on August 2007.  INTERVAL HISTORY: Anna Dickerson 77 y.o. female returns for  regular  visit for followup of  Stage I (T1A N0 M0) left sided breast carcinoma, invasive disease, associated with some DCIS, status post lumpectomy, followed her radiation therapy was surgery on August 2007. She had an ER positive tumor and 93% PR +90% HER-2/neu negative, Ki-67 marker low 8%, high grade DCIS however was seen and a little microinvasion was also found. The maximum size of the invasive compound was 4 mm. She is status post 5 years of tamoxifen 20 mg daily. She tolerated this well. She started tamoxifen on 12/23/2005.  Patient denies any complaints at this moment in time. We spent some time going over the risks and benefits of continuing tamoxifen for an additional 5 years. I explained to the patient that continuation of tamoxifen for another 5 years will confer her a slight benefit however this will carry risks such as increased risk of endometrial cancer (patient does have her uterus still), continuation of hot flashes, and a risk of deep vein thromboses. Given her age, she has concluded that she will not continue tamoxifen for an additional 5 years. She reports that the risks outweigh the benefits. This is certainly a reasonable choice that she has made and I  agree with this.  I personally reviewed and went over laboratory results with the patient. She does have some evidence of fatty infiltration of liver via mildly elevated liver enzymes. I spent some time with the patient going over patient education regarding fatty infiltration of the liver.  The main culprit of this disease is obesity and being overweight. This patient is obese weighing 231.6 pounds. I gave her some education regarding healthy choices and exercising.    I personally reviewed and went over radiographic studies with the patient.  Her mammogram a number of months ago was negative. She will continue getting yearly mammograms. She will also continue followup with [place] on a yearly basis along with her primary care physician. Breast exams we'll also be performed during the visit. She recently saw her surgeon, Dr. Dalbert Batman, who will see the patient back on a when necessary basis. He has released her from his clinic.  The patient also brings to light that in the middle of her back, she has a slight erythematous rash. She does report switching laundry detergent and soap recently. I suggested that she go back to her original products that did not cause this issue. I suggested she utilizes over-the-counter Benadryl lotion or other over-the-counter anti-itch skin products.  Past Medical History  Diagnosis Date  . Cancer [id number]    breast  . Diabetes mellitus   . Hypertension   . Macular degeneration, left eye   . Stage I left-sided Breast cancer 12/31/2010  . Obesity 12/31/2010  . HTN (hypertension) 12/31/2010  . Macular degeneration 12/31/2010  . DM (diabetes mellitus) 12/31/2010  has Stage I left-sided Breast cancer; Obesity; HTN (hypertension); Macular degeneration; and DM (diabetes mellitus) on her problem list.     is allergic to sulfa antibiotics.  Ms. Dressler does not currently have medications on file.  Past Surgical History  Procedure Date  . Replacement total knee bilateral    . Bladder tacked-up   . Breast surgery  left   . Bilateral cataract with intraocular lens implant     Denies any headaches, dizziness, double vision, fevers, chills, night sweats, nausea, vomiting, diarrhea, constipation, chest pain, heart palpitations, shortness of breath, blood in stool, black tarry stool, urinary pain, urinary burning, urinary frequency, hematuria.   PHYSICAL EXAMINATION  ECOG PERFORMANCE STATUS: 0 - Asymptomatic  Filed Vitals:   12/31/10 0841  BP: 150/74  Pulse: 112  Temp: 98.3 F (36.8 C)    GENERAL:alert, no distress, well nourished, well developed, comfortable, cooperative, obese and smiling SKIN: skin color, texture, turgor are normal HEAD: Normocephalic EYES: normal EARS: External ears normal OROPHARYNX:mucous membranes are moist  NECK: supple, no adenopathy, trachea midline LYMPH:  no palpable lymphadenopathy, no hepatosplenomegaly BREAST:right breast normal without mass, skin or nipple changes or axillary nodes, left breast normal without mass, skin or nipple changes or axillary nodes LUNGS: clear to auscultation and percussion HEART: regular rate & rhythm, no murmurs, no gallops, S1 normal and S2 normal ABDOMEN:abdomen soft, non-tender, obese, normal bowel sounds, difficult to assess for hepatomegaly due to body habitus and no hepatosplenomegaly BACK: Back symmetric, no curvature., Erythematous rash noted in the low back midline and laterally. It is blanchable EXTREMITIES:less then 2 second capillary refill, no joint deformities, effusion, or inflammation, no edema, no skin discoloration, no clubbing, no cyanosis  NEURO: alert & oriented x 3 with fluent speech, no focal motor/sensory deficits, gait normal  LABORATORY DATA: CBC    Component Value Date/Time   WBC 9.8 05/21/2010 1234   RBC 3.88 05/21/2010 1234   HGB 12.4 05/21/2010 1234   HCT 37.1 05/21/2010 1234   PLT 183 05/21/2010 1234   MCV 95.6 05/21/2010 1234   MCH 32.0 05/21/2010 1234   MCHC 33.4  05/21/2010 1234   RDW 14.2 05/21/2010 1234   LYMPHSABS 2.9 11/03/2007 1445   MONOABS 0.8 11/03/2007 1445   EOSABS 0.4 11/03/2007 1445   BASOSABS 0.1 11/03/2007 1445      Chemistry      Component Value Date/Time   NA 139 05/21/2010 1234   K 4.2 05/21/2010 1234   CL 102 05/21/2010 1234   CO2 25 05/21/2010 1234   BUN 29* 05/21/2010 1234   CREATININE 1.11 05/21/2010 1234      Component Value Date/Time   CALCIUM 10.1 05/21/2010 1234   ALKPHOS 78 05/21/2010 1234   AST 98* 05/21/2010 1234   ALT 64* 05/21/2010 1234   BILITOT 0.4 05/21/2010 1234        PENDING LABS: CBC diff, CMET   RADIOGRAPHIC STUDIES:  05/28/2010   *RADIOLOGY REPORT*  Clinical Data: Elevated liver function tests. History of breast  cancer.  COMPLETE ABDOMINAL ULTRASOUND 05/28/2010:  Comparison: CT angio abdomen 07/07/2006.  Findings:  Gallbladder: No shadowing gallstones. Small amount of echogenic  sludge. No gallbladder wall thickening or pericholecystic fluid.  Negative sonographic Murphy's sign according to the ultrasound  technologist.  Common bile duct: Normal in caliber with maximum diameter  approximating 3 mm.  Liver: Mildly enlarged measuring approximate 20 cm in coronal  length. Diffusely increased and coarsened echotexture without  focal hepatic parenchymal abnormality. Patent portal vein  with  hepatopetal flow.  IVC: Patent.  Pancreas: Although the pancreas is difficult to visualize in its  entirety, no focal pancreatic abnormality is identified.  Spleen: Normal size and echotexture without focal parenchymal  abnormality.  Right Kidney: No hydronephrosis. Diffuse cortical thinning.  Normal parenchymal echotexture without focal parenchymal  abnormality. No shadowing calculi. Approximately 11.3 cm length.  Left Kidney: No hydronephrosis. Diffuse cortical thinning.  Normal parenchymal echotexture without focal parenchymal  abnormality. No shadowing calculi. Approximately 11.3 cm length.  Abdominal aorta: Normal  in caliber throughout its visualized  course in the abdomen with mild to moderate atherosclerosis.  IMPRESSION:  1. Mild hepatomegaly and diffuse hepatic steatosis and/or  hepatocellular disease. No focal hepatic parenchymal  abnormalities.  2. Small amount of gallbladder sludge. No evidence of  cholelithiasis or cholecystitis.  3. Cortical thinning involving both kidneys which are otherwise  unremarkable.  4. Aortic atherosclerosis without aneurysm.  Original Report Authenticated By: Deniece Portela, M.D.      09/25/10  *RADIOLOGY REPORT*  Clinical Data: Annual examination. History of left breast cancer,  status post lumpectomy in 2007.  DIGITAL DIAGNOSTIC BILATERAL MAMMOGRAM WITH CAD  Comparison: With priors  Findings: The breast parenchyma is predominately fatty replaced.  There is stable scarring at the lumpectomy site in the medial left  breast. Benign secretory type calcifications are seen bilaterally.  No mass, suspicious microcalcification, or nonsurgical distortion  is identified in either breast to suggest malignancy.  Mammographic images were processed with CAD.  IMPRESSION:  No evidence of malignancy in either breast. Lumpectomy changes on  the left.  Bilateral diagnostic mammogram in 1 year is recommended.  BI-RADS CATEGORY 2: Benign finding(s).  Original Report Authenticated By: Curlene Dolphin, M.D.   PATHOLOGY: 10/20/2005  [facility] [address] [place], [place] [address] [phone]  REPORT OF SURGICAL PATHOLOGY  Case #: [id number] Patient Name: GINNI, IXTA. Office Chart Number: N/A  MRN: [id number] Pathologist: Chrystie Nose. Saralyn Pilar, MD DOB/Age 02-Dec-1929 (Age: 71) Gender: F Date Taken: 10/20/2005 Date Received: 10/20/2005  FINAL DIAGNOSIS  MICROSCOPIC EXAMINATION AND DIAGNOSIS  1. LEFT AXILLARY SENTINEL NODE: ONE BENIGN LYMPH NODE (0/1). NO TUMOR IDENTIFIED WITH H IMMUNOHISTOCHEMISTRY.  2. LEFT AXILLARY  SENTINEL NODE: ONE BENIGN LYMPH NODE (0/1). NO TUMOR IDENTIFIED WITH H IMMUNOHISTOCHEMISTRY.  3. LEFT BREAST, NEEDLE LOCALIZED LUMPECTOMY: IN SITU AND FOCAL INVASIVE DUCTAL CARCINOMA, MARGINS NOT INVOLVED.  COMMENT ONCOLOGY TABLE-BREAST, INCISIONAL/EXCISIONAL BIOPSY OR MASTECTOMY WITH LYMPH NODES  1. Maximum tumor size (cm): 0.4 cm invasive component, area involved by DCIS is 2 cm. 2. Margins: Free of tumor Invasive component, distance to closest margin: 0.1 cm from anterior margin. In situ component, distance to closest margin: 0.1 cm from anterior margin. 3. Vascular/Lymphatic invasion: No. 4. Histology, invasive component: Ductal 5. Grade, invasive component (Elston-Ellis modified Scarff-Bloom-Richardson): 1 Tubule formation grade: 2 Nuclear pleomorphism grade: 1 Mitotic grade: 1 6. Extensive intraductal component (JCRT): Yes. 7. Histology, in situ component: Ductal, solid and comedo with necrosis. 8. Grade, in situ component: High-grade. 9. Multicentric (separate tumors in different quadrants): No. 10. Multifocal (separate tumors in same quadrant or biopsy): No 11. Axillary lymph nodes: #examined: 2 #with metastasis: 0 ITC (isolated tumor cells, < 0.49mm): No. Micrometastasis (> 0.68mm, < 28mm): No. Metastasis > 2 mm: No. Extracapsular extension: No. 12. TNM Code: pT1a, pN0, pMX. 13. Breast Prognostic Markers: Case number [id number] Estrogen receptor 93%, positive Progesterone receptor 79%, positive Ki 67 (Mib-1) - Pending Her 2 neu (HercepTest) Pending  Her 2 neu by FISH N/A 14. Non-neoplastic breast: Fibrocystic changes. 15. Comments: Most of the tumor is intermediate and high-grade ductal carcinoma in situ with foci of comedo necrosis. There are several microscopic foci consistent with invasive ductal carcinoma, the largest of which is 0.4 cm and this area is situated 0.1 cm from the anterior margin. Breast prognostic profile will be performed on these  microscopic foci of invasive carcinoma and reported separately. (JDP:gt, 10/22/05)  Some of these immunohistochemical stains may have been developed and the performance characteristics determined by [facility]. Some may not have been cleared or approved by the U.S. Food and Drug Administration. The FDA has determined that such clearance or approval is not necessary. This test is used for clinical purposes. It should not be regarded as investigational or for research. This laboratory is certified under the [place] (CLIA-88) as qualified to perform high complexity clinical laboratory testing.  gdt Date Reported: 10/22/2005 Chrystie Nose. Saralyn Pilar, MD  Electronically Signed Out By JDP   Clinical information Left breast cancer (ms)  specimen(s) obtained 1: Lymph node, sentinel, biopsy, left axilla 2: Lymph node, sentinel, biopsy. left axilla 3: Breast, wire/needle localized excisional biopsy/lumpectomy/partial mastectomy, left  rapid intraoperative Diagnosis 1. RECEIVED FRESH IS TISSUE THAT CONTAINS A LYMPH NODE WHICH IS SAMPLED FOR RIOC. LEFT AXILLARY SENTINEL LYMPH NODE BY TOUCH PREPARATIONS-NO TUMOR SEEN.  2. RECEIVED FRESH IS TISSUE THAT CONTAINS A LYMPH NODE THAT IS SAMPLED FOR RIOC. LEFT AXILLARY SENTINEL LYMPH NODE BY TOUCH PREPARATIONS-NO TUMOR SEEN.  3. RECEIVED FRESH IS BREAST TISSUE WHICH IS EXAMINED AND ORIENTED FOR EVALUATIONS OF MARGINS. LEFT PARTIAL MASTECTOMY MARGINS BY TOUCH PREPARATIONS- ANTERIOR-NO TUMOR SEEN POSTERIOR-NO TUMOR SEEN INFERIOR-NO TUMOR SEEN SUPERIOR-NO TUMOR SEEN MEDIAL-NO TUMOR SEEN LATERAL-NO TUMOR SEEN (BNS    ASSESSMENT:  1. Stage I (T1A NO MO) left sided breast invasive carcinoma, status post 5 years worth of tamoxifen, lumpectomy, and adjuvant radiation therapy. 2. Fatty infiltration of the liver with mildly elevated liver enzymes, followed by gastroenterology 3. Obesity 4.  Hypertension 5. Retropubic mid-urethral sling Solyx type by Dr. Thurston Pounds 1 04/19/2009 6. Hypercholesterolemia 7. Macular degeneration 8. Diabetes mellitus   PLAN:  1. Lab work today: CBC diff, CMET 2. Patient education regarding continuation of tamoxifen for a total of 10 years. Risks, benefits, and alternatives were discussed. The patient at this point time we'll discontinue tamoxifen this month which will complete her 5 years of tamoxifen. 3. Patient education regarding fatty liver disease. This is followed by gastroenterology. I suggested she make healthier eating choices and exercise as tolerated.  The mainstay of treatment of this is weight loss 4. I personally reviewed and went over laboratory results with the patient. 5. I personally reviewed and went over radiographic studies with the patient. 6. Patient will continue with yearly mammogram. 7. Patient will return to clinic in one year time for followup. We'll continue to monitor the patient for another 5 years and then discuss releasing her from the clinic.  All questions were answered. The patient knows to call the clinic with any problems, questions or concerns. We can certainly see the patient much sooner if necessary.  KEFALAS,THOMAS

## 2010-12-31 NOTE — Patient Instructions (Signed)
[facility]  Discharge Instructions  RECOMMENDATIONS MADE BY THE CONSULTANT AND ANY TEST RESULTS WILL BE SENT TO YOUR REFERRING DOCTOR.   EXAM FINDINGS BY MD TODAY AND SIGNS AND SYMPTOMS TO REPORT TO CLINIC OR PRIMARY MD: Continue what Tamoxifen you have left and then you are finished for the 5 years. We will do some blood work today. We will call you with any abnormal findings. Return to clinic in 1 year to see MD.    I acknowledge that I have been informed and understand all the instructions given to me and received a copy. I do not have any more questions at this time, but understand that I may call the [facility] at [facility] at [phone] during business hours should I have any further questions or need assistance in obtaining follow-up care.    __________________________________________  _____________  __________ Signature of Patient or Authorized Representative            Date                   Time    __________________________________________ Nurse's Signature

## 2011-08-25 ENCOUNTER — Other Ambulatory Visit (HOSPITAL_COMMUNITY): Payer: Self-pay | Admitting: Family Medicine

## 2011-08-25 DIAGNOSIS — Z9889 Other specified postprocedural states: Secondary | ICD-10-CM

## 2011-10-01 ENCOUNTER — Ambulatory Visit (HOSPITAL_COMMUNITY)
Admission: RE | Admit: 2011-10-01 | Discharge: 2011-10-01 | Disposition: A | Discharge status: Home / Self Care | Payer: Medicare Other | Source: Ambulatory Visit | Attending: Family Medicine | Admitting: Family Medicine

## 2011-10-01 ENCOUNTER — Ambulatory Visit (HOSPITAL_COMMUNITY): Payer: Medicare Other

## 2011-10-01 DIAGNOSIS — Z9889 Other specified postprocedural states: Secondary | ICD-10-CM

## 2011-10-01 DIAGNOSIS — Z853 Personal history of malignant neoplasm of breast: Secondary | ICD-10-CM | POA: Insufficient documentation

## 2011-12-29 ENCOUNTER — Other Ambulatory Visit (HOSPITAL_COMMUNITY): Payer: Self-pay | Admitting: Oncology

## 2011-12-29 ENCOUNTER — Encounter (HOSPITAL_COMMUNITY): Payer: Medicare Other | Attending: Oncology | Admitting: Oncology

## 2011-12-29 ENCOUNTER — Encounter (HOSPITAL_COMMUNITY): Payer: Self-pay | Admitting: Oncology

## 2011-12-29 VITALS — BP 158/72 | HR 74 | Temp 97.6°F | Resp 20 | Wt 243.1 lb

## 2011-12-29 DIAGNOSIS — C50919 Malignant neoplasm of unspecified site of unspecified female breast: Secondary | ICD-10-CM

## 2011-12-29 DIAGNOSIS — R21 Rash and other nonspecific skin eruption: Secondary | ICD-10-CM

## 2011-12-29 DIAGNOSIS — E119 Type 2 diabetes mellitus without complications: Secondary | ICD-10-CM | POA: Insufficient documentation

## 2011-12-29 DIAGNOSIS — E7800 Pure hypercholesterolemia, unspecified: Secondary | ICD-10-CM | POA: Insufficient documentation

## 2011-12-29 DIAGNOSIS — D649 Anemia, unspecified: Secondary | ICD-10-CM

## 2011-12-29 DIAGNOSIS — H3530 Unspecified macular degeneration: Secondary | ICD-10-CM | POA: Insufficient documentation

## 2011-12-29 DIAGNOSIS — I10 Essential (primary) hypertension: Secondary | ICD-10-CM

## 2011-12-29 DIAGNOSIS — Z09 Encounter for follow-up examination after completed treatment for conditions other than malignant neoplasm: Secondary | ICD-10-CM | POA: Insufficient documentation

## 2011-12-29 DIAGNOSIS — E669 Obesity, unspecified: Secondary | ICD-10-CM | POA: Insufficient documentation

## 2011-12-29 DIAGNOSIS — Z853 Personal history of malignant neoplasm of breast: Secondary | ICD-10-CM

## 2011-12-29 DIAGNOSIS — K7689 Other specified diseases of liver: Secondary | ICD-10-CM | POA: Insufficient documentation

## 2011-12-29 LAB — CBC WITH DIFFERENTIAL/PLATELET
Basophils Relative: 1 % (ref 0–1)
Eosinophils Absolute: 0.4 10*3/uL (ref 0.0–0.7)
Eosinophils Relative: 4 % (ref 0–5)
Hemoglobin: 10.9 g/dL — ABNORMAL LOW (ref 12.0–15.0)
Lymphs Abs: 3.3 10*3/uL (ref 0.7–4.0)
MCH: 29.6 pg (ref 26.0–34.0)
MCHC: 31.9 g/dL (ref 30.0–36.0)
MCV: 92.9 fL (ref 78.0–100.0)
Monocytes Relative: 7 % (ref 3–12)
Platelets: 219 10*3/uL (ref 150–400)
RBC: 3.68 MIL/uL — ABNORMAL LOW (ref 3.87–5.11)

## 2011-12-29 LAB — COMPREHENSIVE METABOLIC PANEL
BUN: 31 mg/dL — ABNORMAL HIGH (ref 6–23)
Calcium: 9.7 mg/dL (ref 8.4–10.5)
GFR calc Af Amer: 49 mL/min — ABNORMAL LOW (ref >90)
Glucose, Bld: 161 mg/dL — ABNORMAL HIGH (ref 70–99)
Total Protein: 8.2 g/dL (ref 6.0–8.3)

## 2011-12-29 MED ORDER — CLOTRIMAZOLE-BETAMETHASONE 1-0.05 % EX CREA: TOPICAL_CREAM | Freq: Two times a day (BID) | CUTANEOUS | Status: DC

## 2011-12-29 NOTE — Patient Instructions (Addendum)
[facility] Discharge Instructions  RECOMMENDATIONS MADE BY THE CONSULTANT AND ANY TEST RESULTS WILL BE SENT TO YOUR REFERRING PHYSICIAN.   Prescription given for Lotrisone cream to use on the area on your back. Use as prescribed. Lab work today before you leave. Return to clinic in 1 year to see Dr.Neijstrom. Report any issues/concerns to clinic as needed.   Thank you for choosing [facility] to provide your oncology and hematology care.  To afford each patient quality time with our providers, please arrive at least 15 minutes before your scheduled appointment time.  With your help, our goal is to use those 15 minutes to complete the necessary work-up to ensure our physicians have the information they need to help with your evaluation and healthcare recommendations.    Effective January 1st, 2014, we ask that you re-schedule your appointment with our physicians should you arrive 10 or more minutes late for your appointment.  We strive to give you quality time with our providers, and arriving late affects you and other patients whose appointments are after yours.    Again, thank you for choosing [facility].  Our hope is that these requests will decrease the amount of time that you wait before being seen by our physicians.       _____________________________________________________________  I acknowledge that I have been informed and understand all the instructions given to me and received a copy. I do not have anymore questions at this time but understand that I may call the [facility] at [facility] at [phone] during business hours should I have any further questions or need assistance in obtaining follow-up care.    __________________________________________  _____________  __________ Signature of Patient or Authorized Representative            Date                   Time    __________________________________________ Nurse's  Signature

## 2011-12-29 NOTE — Progress Notes (Signed)
[name], MD [place] [address]  1. Stage I left-sided Breast cancer     CURRENT THERAPY: Observation  INTERVAL HISTORY: BRIANNE SOBIE 78 y.o. female returns for  regular  visit for followup of Stage I (T1A N0 M0) left sided breast carcinoma, invasive disease, associated with some DCIS, status post lumpectomy in August 2007, followed by radiation therapy.  She had an ER positive tumor at 93%, and  PR + at 90% HER-2/neu negative, Ki-67 marker low 8%, high grade DCIS however was seen and a little microinvasion was also found. The maximum size of the invasive compound was 4 mm. She is status post 5 years of tamoxifen 20 mg daily. She tolerated this well. She started tamoxifen on 12/23/2005 and completed Tamoxifen in December 2012.  Anushka is doing very well. She denies any complaints initially but during the examination reports a low back., Erythematous, rash that arose approximately 2-3 weeks ago. On chart review, I note that the rash was appreciated last year.  She denies any changes in her laundry detergent, soap, shampoo, etc. I will give her Lotrisone cream for this rash.  Otherwise, the patient denies any complaints. She reports that she had a very nice Thanksgiving holiday. For Christmas, all of her children will be present and so they will have a nice get together with family. She reports that all her children are successful with regards to their careers, but 2 of them are not so successful with their marriages/relationships.  She found this comment humorous.  I personally reviewed and went over radiographic studies with the patient.  The patient's mammogram in September of this year was a BI-RADS category 1. She will require a screening mammogram in September 2014.  Oncologically, the patient denies any complaints. She does followup with her primary care physician as directed. We will perform laboratory work today which will be a CBC differential and  complete metabolic panel. We'll fax these results to the patient's primary care physician.    Past Medical History  Diagnosis Date  . Cancer [id number]    breast  . Diabetes mellitus   . Hypertension   . Macular degeneration, left eye   . Stage I left-sided Breast cancer 12/31/2010  . Obesity 12/31/2010  . HTN (hypertension) 12/31/2010  . Macular degeneration 12/31/2010  . DM (diabetes mellitus) 12/31/2010    has Stage I left-sided Breast cancer; Obesity; HTN (hypertension); Macular degeneration; and DM (diabetes mellitus) on her problem list.     is allergic to sulfa antibiotics.  Ms. Underhill does not currently have medications on file.  Past Surgical History  Procedure Date  . Replacement total knee bilateral   . Bladder tacked-up   . Breast surgery  left   . Bilateral cataract with intraocular lens implant     Denies any headaches, dizziness, double vision, fevers, chills, night sweats, nausea, vomiting, diarrhea, constipation, chest pain, heart palpitations, shortness of breath, blood in stool, black tarry stool, urinary pain, urinary burning, urinary frequency, hematuria.   PHYSICAL EXAMINATION  ECOG PERFORMANCE STATUS: 0 - Asymptomatic  Filed Vitals:   12/29/11 1000  BP: 158/72  Pulse: 74  Temp: 97.6 F (36.4 C)  Resp: 20    GENERAL:alert, no distress, well nourished, well developed, comfortable, cooperative, obese and smiling  SKIN: skin color, texture, turgor are normal  HEAD: Normocephalic  EYES: normal  EARS: External ears normal  OROPHARYNX:mucous membranes are moist  NECK: supple, no adenopathy, trachea midline  LYMPH: no palpable  lymphadenopathy, no hepatosplenomegaly  BREAST:Large breasted B/L. Right breast normal without mass, skin or nipple changes or axillary nodes, left breast normal without mass, skin or nipple changes or axillary nodes  LUNGS: clear to auscultation and percussion  HEART: regular rate & rhythm, no murmurs, no gallops, S1 normal  and S2 normal  ABDOMEN:abdomen soft, non-tender, obese, normal bowel sounds, difficult to assess for hepatosplenomegaly due to body habitus, but none noted.   BACK: Back symmetric, no curvature.  Erythematous rash noted in the low back midline and laterally. It is blanchable with signs of excoriations.  EXTREMITIES:less then 2 second capillary refill, no joint deformities, effusion, or inflammation, no edema, no skin discoloration, no clubbing, no cyanosis  NEURO: alert & oriented x 3 with fluent speech, no focal motor/sensory deficits, gait normal   RADIOGRAPHIC STUDIES:  10/01/2011  *RADIOLOGY REPORT*  Clinical Data: History of malignant lumpectomy of the left breast  in 2007. Annual reevaluation.  DIGITAL DIAGNOSTIC BILATERAL MAMMOGRAM WITH CAD  Comparison: Prior studies  Findings: There is a fibrofatty parenchymal pattern present which  is stable. There are stable scarring changes located medially  within the left breast. There is no specific evidence for  recurrent tumor or developing malignancy within either breast.  Mammographic images were processed with CAD.  IMPRESSION:  Stable breast parenchymal pattern. No findings worrisome for  developing malignancy.  RECOMMENDATION:  Bilateral diagnostic mammography in 1 year.  BI-RADS CATEGORY 1: Negative.  Original Report Authenticated By: Altamese Cabal, M.D.    PATHOLOGY: 10/20/2005  [facility] [address] [place], [place] [address] [phone]  REPORT OF SURGICAL PATHOLOGY  Case #: [id number] Patient Name: OYINDAMOLA, MUNTER. Office Chart Number: N/A  MRN: [id number] Pathologist: Chrystie Nose. Saralyn Pilar, MD DOB/Age [birth year] (Age: 89) Gender: F Date Taken: 10/20/2005 Date Received: 10/20/2005  FINAL DIAGNOSIS  MICROSCOPIC EXAMINATION AND DIAGNOSIS  1. LEFT AXILLARY SENTINEL NODE: ONE BENIGN LYMPH NODE (0/1). NO TUMOR IDENTIFIED WITH H IMMUNOHISTOCHEMISTRY.  2. LEFT AXILLARY  SENTINEL NODE: ONE BENIGN LYMPH NODE (0/1). NO TUMOR IDENTIFIED WITH H IMMUNOHISTOCHEMISTRY.  3. LEFT BREAST, NEEDLE LOCALIZED LUMPECTOMY: IN SITU AND FOCAL INVASIVE DUCTAL CARCINOMA, MARGINS NOT INVOLVED.  COMMENT ONCOLOGY TABLE-BREAST, INCISIONAL/EXCISIONAL BIOPSY OR MASTECTOMY WITH LYMPH NODES  1. Maximum tumor size (cm): 0.4 cm invasive component, area involved by DCIS is 2 cm. 2. Margins: Free of tumor Invasive component, distance to closest margin: 0.1 cm from anterior margin. In situ component, distance to closest margin: 0.1 cm from anterior margin. 3. Vascular/Lymphatic invasion: No. 4. Histology, invasive component: Ductal 5. Grade, invasive component (Elston-Ellis modified Scarff-Bloom-Richardson): 1 Tubule formation grade: 2 Nuclear pleomorphism grade: 1 Mitotic grade: 1 6. Extensive intraductal component (JCRT): Yes. 7. Histology, in situ component: Ductal, solid and comedo with necrosis. 8. Grade, in situ component: High-grade. 9. Multicentric (separate tumors in different quadrants): No. 10. Multifocal (separate tumors in same quadrant or biopsy): No 11. Axillary lymph nodes: #examined: 2 #with metastasis: 0 ITC (isolated tumor cells, < 0.48mm): No. Micrometastasis (> 0.25mm, < 61mm): No. Metastasis > 2 mm: No. Extracapsular extension: No. 12. TNM Code: pT1a, pN0, pMX. 13. Breast Prognostic Markers: Case number [id number] Estrogen receptor 93%, positive Progesterone receptor 79%, positive Ki 67 (Mib-1) - Pending Her 2 neu (HercepTest) Pending Her 2 neu by FISH N/A 14. Non-neoplastic breast: Fibrocystic changes. 15. Comments: Most of the tumor is intermediate and high-grade ductal carcinoma in situ with foci of comedo necrosis. There are several microscopic foci consistent with invasive ductal  carcinoma, the largest of which is 0.4 cm and this area is situated 0.1 cm from the anterior margin. Breast prognostic profile will be performed on these  microscopic foci of invasive carcinoma and reported separately. (JDP:gt, 10/22/05)  Some of these immunohistochemical stains may have been developed and the performance characteristics determined by [facility]. Some may not have been cleared or approved by the U.S. Food and Drug Administration. The FDA has determined that such clearance or approval is not necessary. This test is used for clinical purposes. It should not be regarded as investigational or for research. This laboratory is certified under the [place] (CLIA-88) as qualified to perform high complexity clinical laboratory testing.  gdt Date Reported: 10/22/2005 Chrystie Nose. Saralyn Pilar, MD Electronically Signed Out By JDP   Clinical information Left breast cancer (ms)  specimen(s) obtained 1: Lymph node, sentinel, biopsy, left axilla 2: Lymph node, sentinel, biopsy. left axilla 3: Breast, wire/needle localized excisional biopsy/lumpectomy/partial mastectomy, left  rapid intraoperative Diagnosis 1. RECEIVED FRESH IS TISSUE THAT CONTAINS A LYMPH NODE WHICH IS SAMPLED FOR RIOC. LEFT AXILLARY SENTINEL LYMPH NODE BY TOUCH PREPARATIONS-NO TUMOR SEEN.  2. RECEIVED FRESH IS TISSUE THAT CONTAINS A LYMPH NODE THAT IS SAMPLED FOR RIOC. LEFT AXILLARY SENTINEL LYMPH NODE BY TOUCH PREPARATIONS-NO TUMOR SEEN.  3. RECEIVED FRESH IS BREAST TISSUE WHICH IS EXAMINED AND ORIENTED FOR EVALUATIONS OF MARGINS. LEFT PARTIAL MASTECTOMY MARGINS BY TOUCH PREPARATIONS- ANTERIOR-NO TUMOR SEEN POSTERIOR-NO TUMOR SEEN INFERIOR-NO TUMOR SEEN SUPERIOR-NO TUMOR SEEN MEDIAL-NO TUMOR SEEN LATERAL-NO TUMOR SEEN (BNS     ASSESSMENT:  1. Stage I (T1A NO MO) left sided breast invasive carcinoma, status post 5 years worth of tamoxifen, lumpectomy, and adjuvant radiation therapy.  2. Fatty infiltration of the liver with mildly elevated liver enzymes, followed by gastroenterology  3. Obesity   4. Hypertension  5. Retropubic mid-urethral sling Solyx type by Dr. Thurston Pounds 1 04/19/2009  6. Hypercholesterolemia  7. Macular degeneration  8. Diabetes mellitus 9. Low back rash, pruritic.    PLAN:  1. I personally reviewed and went over radiographic studies with the patient. 2. Mammogram in 09/2012 for screening purposes.  3. Continue follow-up with PCP 4. Labs today: CBC diff, CMET 5. Continue follow-up with PCP as directed 6. RX for Lotrisone cream with 0 refills.  7. Return in 1 year for follow-up    All questions were answered. The patient knows to call the clinic with any problems, questions or concerns. We can certainly see the patient much sooner if necessary.  The patient and plan discussed with Everardo All, MD and he is in agreement with the aforementioned.  Kylene Zamarron

## 2012-02-23 ENCOUNTER — Encounter (HOSPITAL_COMMUNITY): Payer: Medicare PPO | Attending: Oncology

## 2012-02-23 DIAGNOSIS — D649 Anemia, unspecified: Secondary | ICD-10-CM

## 2012-02-23 DIAGNOSIS — Z853 Personal history of malignant neoplasm of breast: Secondary | ICD-10-CM

## 2012-02-23 DIAGNOSIS — C50919 Malignant neoplasm of unspecified site of unspecified female breast: Secondary | ICD-10-CM

## 2012-02-23 LAB — BASIC METABOLIC PANEL
CO2: 24 mEq/L (ref 19–32)
Chloride: 99 mEq/L (ref 96–112)
Creatinine, Ser: 1.18 mg/dL — ABNORMAL HIGH (ref 0.50–1.10)

## 2012-02-23 LAB — CBC WITH DIFFERENTIAL/PLATELET
Basophils Absolute: 0 10*3/uL (ref 0.0–0.1)
HCT: 34.4 % — ABNORMAL LOW (ref 36.0–46.0)
Hemoglobin: 11 g/dL — ABNORMAL LOW (ref 12.0–15.0)
Lymphocytes Relative: 33 % (ref 12–46)
Lymphs Abs: 3.1 10*3/uL (ref 0.7–4.0)
Monocytes Absolute: 0.5 10*3/uL (ref 0.1–1.0)
Monocytes Relative: 6 % (ref 3–12)
Neutro Abs: 5.2 10*3/uL (ref 1.7–7.7)
RBC: 3.81 MIL/uL — ABNORMAL LOW (ref 3.87–5.11)
RDW: 14.3 % (ref 11.5–15.5)
WBC: 9.2 10*3/uL (ref 4.0–10.5)

## 2012-02-23 LAB — IRON AND TIBC
Saturation Ratios: 13 % — ABNORMAL LOW (ref 20–55)
TIBC: 401 ug/dL (ref 250–470)

## 2012-02-23 LAB — VITAMIN B12: Vitamin B-12: 338 pg/mL (ref 211–911)

## 2012-02-23 NOTE — Progress Notes (Signed)
Anna Dickerson's reason for visit today are for labs as scheduled per MD orders.  Venipuncture performed with a 23 gauge butterfly needle to R Antecubital.  Anna Dickerson tolerated venipuncture well and without incident; questions were answered and patient was discharged.

## 2012-02-24 ENCOUNTER — Encounter (HOSPITAL_COMMUNITY): Payer: Self-pay | Admitting: Oncology

## 2012-02-24 ENCOUNTER — Other Ambulatory Visit (HOSPITAL_COMMUNITY): Payer: Self-pay | Admitting: Oncology

## 2012-02-24 DIAGNOSIS — E611 Iron deficiency: Secondary | ICD-10-CM

## 2012-02-24 HISTORY — DX: Iron deficiency: E61.1

## 2012-02-24 MED ORDER — POLYSACCHARIDE IRON COMPLEX 150 MG PO CAPS: 150.0000 mg | ORAL_CAPSULE | Freq: Two times a day (BID) | ORAL | Status: DC

## 2012-08-24 ENCOUNTER — Other Ambulatory Visit (HOSPITAL_COMMUNITY): Payer: Self-pay | Admitting: Family Medicine

## 2012-08-24 DIAGNOSIS — Z9889 Other specified postprocedural states: Secondary | ICD-10-CM

## 2012-10-06 ENCOUNTER — Ambulatory Visit (HOSPITAL_COMMUNITY)
Admission: RE | Admit: 2012-10-06 | Discharge: 2012-10-06 | Disposition: A | Discharge status: Home / Self Care | Payer: Medicare PPO | Source: Ambulatory Visit | Attending: Family Medicine | Admitting: Family Medicine

## 2012-10-06 DIAGNOSIS — Z853 Personal history of malignant neoplasm of breast: Secondary | ICD-10-CM | POA: Insufficient documentation

## 2012-10-06 DIAGNOSIS — Z9889 Other specified postprocedural states: Secondary | ICD-10-CM

## 2012-12-28 ENCOUNTER — Ambulatory Visit (HOSPITAL_COMMUNITY): Payer: Medicare Other

## 2012-12-29 NOTE — Progress Notes (Signed)
This encounter was created in error - please disregard.

## 2013-02-01 ENCOUNTER — Encounter (HOSPITAL_COMMUNITY): Payer: Self-pay

## 2013-02-01 ENCOUNTER — Encounter (HOSPITAL_COMMUNITY): Payer: Medicare PPO | Attending: Hematology and Oncology

## 2013-02-01 ENCOUNTER — Encounter (HOSPITAL_BASED_OUTPATIENT_CLINIC_OR_DEPARTMENT_OTHER): Payer: Medicare PPO

## 2013-02-01 VITALS — BP 146/60 | HR 83 | Temp 97.7°F | Resp 16 | Wt 242.4 lb

## 2013-02-01 DIAGNOSIS — I10 Essential (primary) hypertension: Secondary | ICD-10-CM | POA: Insufficient documentation

## 2013-02-01 DIAGNOSIS — Z853 Personal history of malignant neoplasm of breast: Secondary | ICD-10-CM | POA: Insufficient documentation

## 2013-02-01 DIAGNOSIS — E611 Iron deficiency: Secondary | ICD-10-CM

## 2013-02-01 DIAGNOSIS — D509 Iron deficiency anemia, unspecified: Secondary | ICD-10-CM | POA: Insufficient documentation

## 2013-02-01 DIAGNOSIS — E119 Type 2 diabetes mellitus without complications: Secondary | ICD-10-CM | POA: Insufficient documentation

## 2013-02-01 DIAGNOSIS — K760 Fatty (change of) liver, not elsewhere classified: Secondary | ICD-10-CM

## 2013-02-01 DIAGNOSIS — C50912 Malignant neoplasm of unspecified site of left female breast: Secondary | ICD-10-CM

## 2013-02-01 DIAGNOSIS — Z09 Encounter for follow-up examination after completed treatment for conditions other than malignant neoplasm: Secondary | ICD-10-CM | POA: Insufficient documentation

## 2013-02-01 LAB — COMPREHENSIVE METABOLIC PANEL
ALT: 26 U/L (ref 0–35)
AST: 39 U/L — ABNORMAL HIGH (ref 0–37)
Albumin: 3.5 g/dL (ref 3.5–5.2)
Alkaline Phosphatase: 101 U/L (ref 39–117)
BILIRUBIN TOTAL: 0.3 mg/dL (ref 0.3–1.2)
BUN: 36 mg/dL — AB (ref 6–23)
CHLORIDE: 100 meq/L (ref 96–112)
CO2: 26 meq/L (ref 19–32)
CREATININE: 1.14 mg/dL — AB (ref 0.50–1.10)
Calcium: 9.6 mg/dL (ref 8.4–10.5)
GFR calc non Af Amer: 44 mL/min — ABNORMAL LOW (ref >90)
GFR, EST AFRICAN AMERICAN: 51 mL/min — AB (ref >90)
GLUCOSE: 246 mg/dL — AB (ref 70–99)
Potassium: 4.7 mEq/L (ref 3.7–5.3)
Sodium: 139 mEq/L (ref 137–147)
Total Protein: 8.3 g/dL (ref 6.0–8.3)

## 2013-02-01 LAB — CBC WITH DIFFERENTIAL/PLATELET
Basophils Absolute: 0.1 10*3/uL (ref 0.0–0.1)
Basophils Relative: 1 % (ref 0–1)
EOS ABS: 0.3 10*3/uL (ref 0.0–0.7)
EOS PCT: 3 % (ref 0–5)
HCT: 31.4 % — ABNORMAL LOW (ref 36.0–46.0)
HEMOGLOBIN: 9.8 g/dL — AB (ref 12.0–15.0)
Lymphocytes Relative: 32 % (ref 12–46)
Lymphs Abs: 3.1 10*3/uL (ref 0.7–4.0)
MCH: 26.4 pg (ref 26.0–34.0)
MCHC: 31.2 g/dL (ref 30.0–36.0)
MCV: 84.6 fL (ref 78.0–100.0)
MONOS PCT: 7 % (ref 3–12)
Monocytes Absolute: 0.7 10*3/uL (ref 0.1–1.0)
Neutro Abs: 5.6 10*3/uL (ref 1.7–7.7)
Neutrophils Relative %: 57 % (ref 43–77)
Platelets: 289 10*3/uL (ref 150–400)
RBC: 3.71 MIL/uL — ABNORMAL LOW (ref 3.87–5.11)
RDW: 15.3 % (ref 11.5–15.5)
WBC: 9.8 10*3/uL (ref 4.0–10.5)

## 2013-02-01 LAB — FERRITIN: FERRITIN: 28 ng/mL (ref 10–291)

## 2013-02-01 NOTE — Progress Notes (Signed)
[facility]  OFFICE PROGRESS NOTE  Anna Hampshire, MD [address] [place] [address]  DIAGNOSIS: Breast cancer, left - Plan: CBC with Differential, Comprehensive metabolic panel, Cancer antigen 27.29, CEA  Iron deficiency - Plan: CBC with Differential, Comprehensive metabolic panel, Cancer antigen 27.29, CEA  DM (diabetes mellitus) - Plan: CBC with Differential, Comprehensive metabolic panel, Cancer antigen 27.29, CEA  Fatty infiltration of liver - Plan: CBC with Differential, Comprehensive metabolic panel, Cancer antigen 27.29, CEA  Chief Complaint  Patient presents with  . Breast Cancer     tamoxifen until December 2012    CURRENT THERAPY: Watchful expectation  INTERVAL HISTORY: Anna Dickerson 80 y.o. female returns for followup of stage I left-sided breast cancer manifesting minimal invasive disease with predominantly noninvasive ductal neoplasia,, status post lumpectomy, radiotherapy, and tamoxifen ending in December of 2012. Unfortunately her husband passed away 5 weeks ago after a three-month bout with worsening chronic obstructive pulmonary disease. They were married 31 years. She denies any lymphedema, abnormalities on self breast examination, fever, night sweats, vaginal bleeding or discharge, lower extremity swelling or redness, cough, wheezing, abdominal pain, nausea, vomiting, diarrhea, constipation, melena, hematochezia, hematuria, skin rash, headache, or seizures.  MEDICAL HISTORY: Past Medical History  Diagnosis Date  . Cancer [id number]    breast  . Diabetes mellitus   . Hypertension   . Macular degeneration, left eye   . Stage I left-sided Breast cancer 12/31/2010  . Obesity 12/31/2010  . HTN (hypertension) 12/31/2010  . Macular degeneration 12/31/2010  . DM (diabetes mellitus) 12/31/2010  . Iron deficiency 02/24/2012    INTERIM HISTORY: has Stage I left-sided Breast cancer; Obesity; HTN  (hypertension); Macular degeneration; DM (diabetes mellitus); and Iron deficiency on her problem list.   Stage I (T1A N0 M0) left sided breast carcinoma, invasive disease, associated with some DCIS, status post lumpectomy in August 2007, followed by radiation therapy. She had an ER positive tumor at 93%, and PR + at 90% HER-2/neu negative, Ki-67 marker low 8%, high grade DCIS however was seen and a little microinvasion was also found. The maximum size of the invasive compound was 4 mm. She is status post 5 years of tamoxifen 20 mg daily. She tolerated this well. She started tamoxifen on 12/23/2005 and completed Tamoxifen in December 2012.  ALLERGIES:  is allergic to sulfa antibiotics.  MEDICATIONS: has a current medication list which includes the following prescription(s): aliskiren-hydrochlorothiazide, allopurinol, amitriptyline, amlodipine-olmesartan, aspirin, atorvastatin, clonidine, clotrimazole-betamethasone, hydrochlorothiazide, iron polysaccharides, lorazepam, metformin, metoprolol tartrate, multiple vitamin, nitrofurantoin, tekturna, trimethoprim, venlafaxine, myrbetriq, and tamoxifen.  SURGICAL HISTORY:  Past Surgical History  Procedure Laterality Date  . Replacement total knee bilateral    . Bladder tacked-up    . Breast surgery  left    . Bilateral cataract with intraocular lens implant      FAMILY HISTORY: family history includes Cancer in her mother.  SOCIAL HISTORY:  reports that she has never smoked. She has never used smokeless tobacco. She reports that she does not drink alcohol or use illicit drugs.  REVIEW OF SYSTEMS:  Other than that discussed above is noncontributory.  PHYSICAL EXAMINATION: ECOG PERFORMANCE STATUS: 0 - Asymptomatic  Blood pressure 146/60, pulse 83, temperature 97.7 F (36.5 C), temperature source Oral, resp. rate 16, weight 242 lb 6.4 oz (109.952 kg).  GENERAL:alert, no distress and comfortable SKIN: skin color, texture, turgor are normal, no rashes  or significant lesions EYES: PERLA; Conjunctiva are pink  and non-injected, sclera clear OROPHARYNX:no exudate, no erythema on lips, buccal mucosa, or tongue. NECK: supple, thyroid normal size, non-tender, without nodularity. No masses CHEST: Pendulous breasts with no masses on either side. LYMPH:  no palpable lymphadenopathy in the cervical, axillary or inguinal LUNGS: clear to auscultation and percussion with normal breathing effort HEART: regular rate & rhythm and no murmurs. ABDOMEN:abdomen soft, non-tender and normal bowel sounds MUSCULOSKELETAL:no cyanosis of digits and no clubbing. Range of motion normal.  NEURO: alert & oriented x 3 with fluent speech, no focal motor/sensory deficits   LABORATORY DATA: Infusion on 02/01/2013  Component Date Value Range Status  . WBC 02/01/2013 9.8  4.0 - 10.5 K/uL Final  . RBC 02/01/2013 3.71* 3.87 - 5.11 MIL/uL Final  . Hemoglobin 02/01/2013 9.8* 12.0 - 15.0 g/dL Final  . HCT 02/01/2013 31.4* 36.0 - 46.0 % Final  . MCV 02/01/2013 84.6  78.0 - 100.0 fL Final  . MCH 02/01/2013 26.4  26.0 - 34.0 pg Final  . MCHC 02/01/2013 31.2  30.0 - 36.0 g/dL Final  . RDW 02/01/2013 15.3  11.5 - 15.5 % Final  . Platelets 02/01/2013 289  150 - 400 K/uL Final  . Neutrophils Relative % 02/01/2013 57  43 - 77 % Final  . Neutro Abs 02/01/2013 5.6  1.7 - 7.7 K/uL Final  . Lymphocytes Relative 02/01/2013 32  12 - 46 % Final  . Lymphs Abs 02/01/2013 3.1  0.7 - 4.0 K/uL Final  . Monocytes Relative 02/01/2013 7  3 - 12 % Final  . Monocytes Absolute 02/01/2013 0.7  0.1 - 1.0 K/uL Final  . Eosinophils Relative 02/01/2013 3  0 - 5 % Final  . Eosinophils Absolute 02/01/2013 0.3  0.0 - 0.7 K/uL Final  . Basophils Relative 02/01/2013 1  0 - 1 % Final  . Basophils Absolute 02/01/2013 0.1  0.0 - 0.1 K/uL Final  . Sodium 02/01/2013 139  137 - 147 mEq/L Final  . Potassium 02/01/2013 4.7  3.7 - 5.3 mEq/L Final  . Chloride 02/01/2013 100  96 - 112 mEq/L Final  . CO2  02/01/2013 26  19 - 32 mEq/L Final  . Glucose, Bld 02/01/2013 246* 70 - 99 mg/dL Final  . BUN 02/01/2013 36* 6 - 23 mg/dL Final  . Creatinine, Ser 02/01/2013 1.14* 0.50 - 1.10 mg/dL Final  . Calcium 02/01/2013 9.6  8.4 - 10.5 mg/dL Final  . Total Protein 02/01/2013 8.3  6.0 - 8.3 g/dL Final  . Albumin 02/01/2013 3.5  3.5 - 5.2 g/dL Final  . AST 02/01/2013 39* 0 - 37 U/L Final  . ALT 02/01/2013 26  0 - 35 U/L Final  . Alkaline Phosphatase 02/01/2013 101  39 - 117 U/L Final  . Total Bilirubin 02/01/2013 0.3  0.3 - 1.2 mg/dL Final  . GFR calc non Af Amer 02/01/2013 44* >90 mL/min Final  . GFR calc Af Amer 02/01/2013 51* >90 mL/min Final   Comment: (NOTE)                          The eGFR has been calculated using the CKD EPI equation.                          This calculation has not been validated in all clinical situations.  eGFR's persistently <90 mL/min signify possible Chronic Kidney                          Disease.    PATHOLOGY: No new pathology.  Urinalysis No results found for this basename: colorurine,  appearanceur,  labspec,  phurine,  glucoseu,  hgbur,  bilirubinur,  ketonesur,  proteinur,  urobilinogen,  nitrite,  leukocytesur    RADIOGRAPHIC STUDIES: MM Digital Diagnostic Bilat Status: Final result            Study Result    *RADIOLOGY REPORT*  Clinical Data: History of left breast lumpectomy in 2007.  DIGITAL DIAGNOSTIC BILATERAL MAMMOGRAM WITH CAD  Comparison: Priors dating back to 08/31/2006.  Findings:  ACR Breast Density Category a: The breast tissue is almost  entirely fatty.  Stable postsurgical changes medially within the left breast. No  concerning masses, calcifications or nonsurgical architectural  distortion.  Mammographic images were processed with CAD.  IMPRESSION:  No mammographic evidence for malignancy. Stable postsurgical  changes.  RECOMMENDATION:  Screening mammogram in one year. (Code:SM-B-01Y)  I have  discussed the findings and recommendations with the patient.  Results were also provided in writing at the conclusion of the  visit. If applicable, a reminder letter will be sent to the  patient regarding her next appointment.  BI-RADS CATEGORY 2: Benign finding(s).  Original Report Authenticated      ASSESSMENT:  1. Stage I (T1A NO MO) left sided breast invasive carcinoma, status post 5 years worth of tamoxifen, lumpectomy, and adjuvant radiation therapy.  2. Fatty infiltration of the liver with mildly elevated liver enzymes, followed by gastroenterology  3. Obesity  4. Hypertension  5. Retropubic mid-urethral sling Solyx type by Dr. Thurston Pounds 1 04/19/2009  6. Hypercholesterolemia  7. Macular degeneration, currently on intraocular  VEGFR inhibitors.  8. Diabetes mellitus, type II, controlled.   PLAN:  #1. Repeat mammogram in September 2015. #2. Continue monthly self breast examination. #3. Followup in one year with CBC, chem profile, CEA, and CA 27-29.   All questions were answered. The patient knows to call the clinic with any problems, questions or concerns. We can certainly see the patient much sooner if necessary.   I spent 25 minutes counseling the patient face to face. The total time spent in the appointment was 30 minutes.    Doroteo Bradford, MD 02/01/2013 12:47 PM

## 2013-02-01 NOTE — Patient Instructions (Signed)
[facility] Discharge Instructions  RECOMMENDATIONS MADE BY THE CONSULTANT AND ANY TEST RESULTS WILL BE SENT TO YOUR REFERRING PHYSICIAN.  EXAM FINDINGS BY THE PHYSICIAN TODAY AND SIGNS OR SYMPTOMS TO REPORT TO CLINIC OR PRIMARY PHYSICIAN: Exam and findings as discussed by Dr. Barnet Glasgow.  INSTRUCTIONS/FOLLOW-UP: 1.  Return in a year as scheduled; sooner if questions or concerns.  Please report any new lumps, bumps, unexplained night sweats. 2.  Mammogram yearly.  Thank you for choosing [facility] to provide your oncology and hematology care.  To afford each patient quality time with our providers, please arrive at least 15 minutes before your scheduled appointment time.  With your help, our goal is to use those 15 minutes to complete the necessary work-up to ensure our physicians have the information they need to help with your evaluation and healthcare recommendations.    Effective January 1st, 2014, we ask that you re-schedule your appointment with our physicians should you arrive 10 or more minutes late for your appointment.  We strive to give you quality time with our providers, and arriving late affects you and other patients whose appointments are after yours.    Again, thank you for choosing [facility].  Our hope is that these requests will decrease the amount of time that you wait before being seen by our physicians.       _____________________________________________________________  Should you have questions after your visit to [facility], please contact our office at [phone] between the hours of 8:30 a.m. and 5:00 p.m.  Voicemails left after 4:30 p.m. will not be returned until the following business day.  For prescription refill requests, have your pharmacy contact our office with your prescription refill request.

## 2013-02-01 NOTE — Progress Notes (Signed)
Labs drawn today for cbc/diff,cmp,[id number],cea

## 2013-02-01 NOTE — Addendum Note (Signed)
Addended byLouis Meckel on: 02/01/2013 12:05 PM   Modules accepted: Orders

## 2013-02-02 ENCOUNTER — Other Ambulatory Visit (HOSPITAL_COMMUNITY): Payer: Self-pay | Admitting: Hematology and Oncology

## 2013-02-02 DIAGNOSIS — D500 Iron deficiency anemia secondary to blood loss (chronic): Secondary | ICD-10-CM | POA: Insufficient documentation

## 2013-02-02 DIAGNOSIS — D509 Iron deficiency anemia, unspecified: Secondary | ICD-10-CM

## 2013-02-02 LAB — CANCER ANTIGEN 27.29: CA 27.29: 27 U/mL (ref 0–39)

## 2013-02-02 LAB — CEA: CEA: 1 ng/mL (ref 0.0–5.0)

## 2013-02-10 ENCOUNTER — Encounter (HOSPITAL_BASED_OUTPATIENT_CLINIC_OR_DEPARTMENT_OTHER): Payer: Medicare PPO

## 2013-02-10 VITALS — BP 147/59 | HR 80 | Temp 97.8°F | Resp 16

## 2013-02-10 DIAGNOSIS — E611 Iron deficiency: Secondary | ICD-10-CM

## 2013-02-10 DIAGNOSIS — D509 Iron deficiency anemia, unspecified: Secondary | ICD-10-CM

## 2013-02-10 MED ORDER — SODIUM CHLORIDE 0.9 % IV SOLN
1020.0000 mg | Freq: Once | INTRAVENOUS | Status: AC
  Administered 2013-02-10: 1020 mg via INTRAVENOUS
  Filled 2013-02-10: qty 34

## 2013-02-10 MED ORDER — SODIUM CHLORIDE 0.9 % IV SOLN
Freq: Once | INTRAVENOUS | Status: AC
  Administered 2013-02-10: 20 mL/h via INTRAVENOUS

## 2013-02-10 NOTE — Patient Instructions (Signed)
[place] Discharge Instructions  RECOMMENDATIONS MADE BY THE CONSULTANT AND ANY TEST RESULTS WILL BE SENT TO YOUR REFERRING PHYSICIAN.  Guaiac Test A guaiac test checks for blood in the poop (bowel movement). BEFORE THE TEST  Avoid alcohol and iron pills.  Only take medicine as told by your doctor.  Ask your doctor about stopping or changing medicines.  Avoid Vitamin C pills or medicines.  Avoid antiseptic solution that has iodine in it.  Avoid citrus fruits and juices. TEST  Put the sample cards and sticks in the bathroom so they will be ready when you have to poop.  Collect a poop sample with the stick.  Smear a small amount of poop on the card.  Take another sample from a different part of the poop.  Store the cards with the samples in a plastic bag.  Throw the rest of the poop into the toilet and flush.  Wash your hands well. AFTER THE TEST  Bring the cards in a plastic bag to your clinic.  Do not wait more than 3 days to take the card to your clinic. Document Released: 10/09/2007 Document Revised: 03/24/2011 Document Reviewed: 10/09/2007 [facility] Patient Information 2014 [place].   Thank you for choosing [place] to provide your oncology and hematology care.  To afford each patient quality time with our providers, please arrive at least 15 minutes before your scheduled appointment time.  With your help, our goal is to use those 15 minutes to complete the necessary work-up to ensure our physicians have the information they need to help with your evaluation and healthcare recommendations.    Effective January 1st, 2014, we ask that you re-schedule your appointment with our physicians should you arrive 10 or more minutes late for your appointment.  We strive to give you quality time with our providers, and arriving late affects you and other patients whose appointments are after yours.    Again, thank you for choosing  [facility].  Our hope is that these requests will decrease the amount of time that you wait before being seen by our physicians.       _____________________________________________________________  Should you have questions after your visit to [facility], please contact our office at [phone] between the hours of 8:30 a.m. and 5:00 p.m.  Voicemails left after 4:30 p.m. will not be returned until the following business day.  For prescription refill requests, have your pharmacy contact our office with your prescription refill request.

## 2013-02-16 ENCOUNTER — Other Ambulatory Visit (HOSPITAL_COMMUNITY): Payer: Self-pay | Admitting: *Deleted

## 2013-02-16 ENCOUNTER — Other Ambulatory Visit (HOSPITAL_COMMUNITY): Payer: Medicare PPO | Admitting: *Deleted

## 2013-02-16 ENCOUNTER — Other Ambulatory Visit (HOSPITAL_COMMUNITY): Payer: Medicare PPO

## 2013-02-16 ENCOUNTER — Encounter (HOSPITAL_COMMUNITY): Payer: Medicare PPO | Attending: Hematology and Oncology

## 2013-02-16 DIAGNOSIS — E611 Iron deficiency: Secondary | ICD-10-CM

## 2013-02-16 DIAGNOSIS — D509 Iron deficiency anemia, unspecified: Secondary | ICD-10-CM

## 2013-02-16 LAB — OCCULT BLOOD X 1 CARD TO LAB, STOOL
FECAL OCCULT BLD: NEGATIVE
Fecal Occult Bld: NEGATIVE
Fecal Occult Bld: NEGATIVE

## 2013-02-16 NOTE — Progress Notes (Signed)
Stool cards returned and orders placed

## 2013-09-26 ENCOUNTER — Other Ambulatory Visit (HOSPITAL_COMMUNITY): Payer: Self-pay | Admitting: Family Medicine

## 2013-09-26 DIAGNOSIS — Z139 Encounter for screening, unspecified: Secondary | ICD-10-CM

## 2013-09-28 ENCOUNTER — Ambulatory Visit (HOSPITAL_COMMUNITY)
Admission: RE | Admit: 2013-09-28 | Discharge: 2013-09-28 | Disposition: A | Discharge status: Home / Self Care | Payer: Medicare PPO | Source: Ambulatory Visit | Attending: Family Medicine | Admitting: Family Medicine

## 2013-09-28 DIAGNOSIS — Z1231 Encounter for screening mammogram for malignant neoplasm of breast: Secondary | ICD-10-CM | POA: Insufficient documentation

## 2013-09-28 DIAGNOSIS — Z139 Encounter for screening, unspecified: Secondary | ICD-10-CM

## 2013-10-04 ENCOUNTER — Encounter (HOSPITAL_COMMUNITY): Payer: Medicare PPO

## 2013-10-07 ENCOUNTER — Other Ambulatory Visit (HOSPITAL_COMMUNITY): Payer: Self-pay | Admitting: Hematology and Oncology

## 2013-10-07 ENCOUNTER — Encounter (HOSPITAL_COMMUNITY): Payer: Self-pay | Admitting: Hematology and Oncology

## 2014-01-31 ENCOUNTER — Other Ambulatory Visit (HOSPITAL_COMMUNITY): Payer: Self-pay

## 2014-02-01 ENCOUNTER — Encounter (HOSPITAL_BASED_OUTPATIENT_CLINIC_OR_DEPARTMENT_OTHER): Payer: Medicare PPO

## 2014-02-01 ENCOUNTER — Encounter (HOSPITAL_COMMUNITY): Payer: Medicare PPO | Attending: Hematology & Oncology | Admitting: Hematology & Oncology

## 2014-02-01 VITALS — BP 172/72 | HR 71 | Temp 97.5°F | Resp 20 | Wt 232.3 lb

## 2014-02-01 DIAGNOSIS — Z139 Encounter for screening, unspecified: Secondary | ICD-10-CM

## 2014-02-01 DIAGNOSIS — N183 Chronic kidney disease, stage 3 (moderate): Secondary | ICD-10-CM | POA: Insufficient documentation

## 2014-02-01 DIAGNOSIS — Z853 Personal history of malignant neoplasm of breast: Secondary | ICD-10-CM | POA: Insufficient documentation

## 2014-02-01 DIAGNOSIS — E611 Iron deficiency: Secondary | ICD-10-CM

## 2014-02-01 DIAGNOSIS — D509 Iron deficiency anemia, unspecified: Secondary | ICD-10-CM

## 2014-02-01 DIAGNOSIS — N189 Chronic kidney disease, unspecified: Secondary | ICD-10-CM

## 2014-02-01 DIAGNOSIS — C50912 Malignant neoplasm of unspecified site of left female breast: Secondary | ICD-10-CM

## 2014-02-01 DIAGNOSIS — I10 Essential (primary) hypertension: Secondary | ICD-10-CM | POA: Diagnosis not present

## 2014-02-01 DIAGNOSIS — E119 Type 2 diabetes mellitus without complications: Secondary | ICD-10-CM

## 2014-02-01 DIAGNOSIS — E669 Obesity, unspecified: Secondary | ICD-10-CM | POA: Diagnosis not present

## 2014-02-01 DIAGNOSIS — Z08 Encounter for follow-up examination after completed treatment for malignant neoplasm: Secondary | ICD-10-CM | POA: Diagnosis present

## 2014-02-01 DIAGNOSIS — D631 Anemia in chronic kidney disease: Secondary | ICD-10-CM

## 2014-02-01 DIAGNOSIS — D500 Iron deficiency anemia secondary to blood loss (chronic): Secondary | ICD-10-CM

## 2014-02-01 DIAGNOSIS — Z23 Encounter for immunization: Secondary | ICD-10-CM

## 2014-02-01 DIAGNOSIS — K760 Fatty (change of) liver, not elsewhere classified: Secondary | ICD-10-CM

## 2014-02-01 LAB — CBC WITH DIFFERENTIAL/PLATELET
Basophils Absolute: 0.1 10*3/uL (ref 0.0–0.1)
Basophils Relative: 1 % (ref 0–1)
EOS ABS: 0.3 10*3/uL (ref 0.0–0.7)
Eosinophils Relative: 3 % (ref 0–5)
HCT: 33.2 % — ABNORMAL LOW (ref 36.0–46.0)
HEMOGLOBIN: 10.5 g/dL — AB (ref 12.0–15.0)
LYMPHS ABS: 3.7 10*3/uL (ref 0.7–4.0)
LYMPHS PCT: 37 % (ref 12–46)
MCH: 28.8 pg (ref 26.0–34.0)
MCHC: 31.6 g/dL (ref 30.0–36.0)
MCV: 91 fL (ref 78.0–100.0)
MONOS PCT: 5 % (ref 3–12)
Monocytes Absolute: 0.5 10*3/uL (ref 0.1–1.0)
NEUTROS PCT: 54 % (ref 43–77)
Neutro Abs: 5.4 10*3/uL (ref 1.7–7.7)
Platelets: 224 10*3/uL (ref 150–400)
RBC: 3.65 MIL/uL — AB (ref 3.87–5.11)
RDW: 14.3 % (ref 11.5–15.5)
WBC: 10 10*3/uL (ref 4.0–10.5)

## 2014-02-01 LAB — COMPREHENSIVE METABOLIC PANEL
ALT: 25 U/L (ref 0–35)
AST: 29 U/L (ref 0–37)
Albumin: 3.9 g/dL (ref 3.5–5.2)
Alkaline Phosphatase: 79 U/L (ref 39–117)
Anion gap: 7 (ref 5–15)
BILIRUBIN TOTAL: 0.4 mg/dL (ref 0.3–1.2)
BUN: 28 mg/dL — AB (ref 6–23)
CALCIUM: 9.1 mg/dL (ref 8.4–10.5)
CO2: 26 mmol/L (ref 19–32)
CREATININE: 1.28 mg/dL — AB (ref 0.50–1.10)
Chloride: 104 mEq/L (ref 96–112)
GFR, EST AFRICAN AMERICAN: 44 mL/min — AB (ref >90)
GFR, EST NON AFRICAN AMERICAN: 38 mL/min — AB (ref >90)
GLUCOSE: 166 mg/dL — AB (ref 70–99)
Potassium: 4.8 mmol/L (ref 3.5–5.1)
Sodium: 137 mmol/L (ref 135–145)
Total Protein: 7.4 g/dL (ref 6.0–8.3)

## 2014-02-01 LAB — FERRITIN: FERRITIN: 48 ng/mL (ref 10–291)

## 2014-02-01 MED ORDER — INFLUENZA VAC SPLIT QUAD 0.5 ML IM SUSY
0.5000 mL | PREFILLED_SYRINGE | Freq: Once | INTRAMUSCULAR | Status: AC
  Administered 2014-02-01: 0.5 mL via INTRAMUSCULAR

## 2014-02-01 MED ORDER — INFLUENZA VAC SPLIT QUAD 0.5 ML IM SUSY
PREFILLED_SYRINGE | INTRAMUSCULAR | Status: AC
  Filled 2014-02-01: qty 0.5

## 2014-02-01 NOTE — Progress Notes (Signed)
Anna Dickerson presents today for injection per MD orders. Fluarix administered IM in right Upper Arm. Administration without incident. Patient tolerated well.

## 2014-02-01 NOTE — Progress Notes (Signed)
Labs for [id number]

## 2014-02-01 NOTE — Progress Notes (Signed)
Anna Hampshire, MD [place] [address]  Stage I (T1A NO MO) left sided breast invasive carcinoma, status post 5 years worth of tamoxifen, lumpectomy, and adjuvant radiation therapy  Fatty infiltration of the liver with mildly elevated liver enzymes, followed by gastroenterology  Bilateral mammogram 09/28/2013, Category 1.  Anemia, hemoccults negative X 3 2015,  Iron deficiency CKD, stage III  CURRENT THERAPY: observation  INTERVAL HISTORY: Anna Dickerson 81 y.o. female returns for follow-up of her early stage breast cancer. She has had 2 knee replacements and they occasionally bother her. She is having eye injections in [place].  She has noticed an improvement in her vision.  MEDICAL HISTORY: Past Medical History  Diagnosis Date  . Cancer [id number]    breast  . Diabetes mellitus   . Hypertension   . Macular degeneration, left eye   . Stage I left-sided Breast cancer 12/31/2010  . Obesity 12/31/2010  . HTN (hypertension) 12/31/2010  . Macular degeneration 12/31/2010  . DM (diabetes mellitus) 12/31/2010  . Iron deficiency 02/24/2012    has Stage I left-sided Breast cancer; Obesity; HTN (hypertension); Macular degeneration; DM (diabetes mellitus); Iron deficiency; and Iron deficiency anemia due to chronic blood loss on her problem list.     No history exists.     is allergic to sulfa antibiotics.  We administered Influenza vac split quadrivalent PF.  SURGICAL HISTORY: Past Surgical History  Procedure Laterality Date  . Replacement total knee bilateral    . Bladder tacked-up    . Breast surgery  left    . Bilateral cataract with intraocular lens implant      SOCIAL HISTORY: History   Social History  . Marital Status: Married    Spouse Name: N/A    Number of Children: N/A  . Years of Education: N/A   Occupational History  . Not on file.   Social History Main Topics  . Smoking status: Never Smoker   . Smokeless tobacco:  Never Used  . Alcohol Use: No  . Drug Use: No  . Sexual Activity: Not on file   Other Topics Concern  . Not on file   Social History Narrative  . No narrative on file    FAMILY HISTORY: Family History  Problem Relation Age of Onset  . Cancer Mother     Review of Systems  Constitutional: Negative.   HENT: Negative.        Dry mouth  Eyes: Negative.   Respiratory: Negative.   Cardiovascular: Negative.   Gastrointestinal: Positive for diarrhea.       Diarrhea is dependent on what she eats  Genitourinary: Positive for urgency and frequency.       Has had a bladder "lift"  Musculoskeletal: Positive for joint pain.  Skin: Negative.   Neurological: Negative.   Endo/Heme/Allergies: Negative.   Psychiatric/Behavioral: Negative.     PHYSICAL EXAMINATION  ECOG PERFORMANCE STATUS: 0 - Asymptomatic  Filed Vitals:   02/01/14 1000  BP: 172/72  Pulse: 71  Temp: 97.5 F (36.4 C)  Resp: 20    Physical Exam  Constitutional: She is oriented to person, place, and time and well-developed, well-nourished, and in no distress.  obese  HENT:  Head: Normocephalic and atraumatic.  Nose: Nose normal.  Mouth/Throat: Oropharynx is clear and moist. No oropharyngeal exudate.  Eyes: Conjunctivae and EOM are normal. Pupils are equal, round, and reactive to light. Right eye exhibits no discharge. Left eye exhibits no discharge. No scleral icterus.  Neck: Normal range of motion. Neck supple. No tracheal deviation present. No thyromegaly present.  Cardiovascular: Normal rate, regular rhythm and normal heart sounds.  Exam reveals no gallop and no friction rub.   No murmur heard. Pulmonary/Chest: Effort normal and breath sounds normal. She has no wheezes. She has no rales.  BILATERAL BREAST EXAM WITH LARGE BREASTS, MILD BILATERAL NIPPLE INVERSION. NO PALPABLE MASSES, NO AXILLARY ADENOPATHY. NO SUSPICIOUS SKIN CHANGES  Abdominal: Soft. Bowel sounds are normal. She exhibits no distension and no  mass. There is no tenderness. There is no rebound and no guarding.  Musculoskeletal: Normal range of motion. She exhibits no edema.  NEEDS SOME ASSISTANCE TO GET ONTO EXAM TABLE.  Lymphadenopathy:    She has no cervical adenopathy.  Neurological: She is alert and oriented to person, place, and time. She has normal reflexes. No cranial nerve deficit. Gait normal. Coordination normal.  Skin: Skin is warm and dry. No rash noted.  Psychiatric: Mood, memory, affect and judgment normal.  Nursing note and vitals reviewed.   LABORATORY DATA:  CBC    Component Value Date/Time   WBC 10.0 02/01/2014 0955   RBC 3.65* 02/01/2014 0955   HGB 10.5* 02/01/2014 0955   HCT 33.2* 02/01/2014 0955   PLT 224 02/01/2014 0955   MCV 91.0 02/01/2014 0955   MCH 28.8 02/01/2014 0955   MCHC 31.6 02/01/2014 0955   RDW 14.3 02/01/2014 0955   LYMPHSABS 3.7 02/01/2014 0955   MONOABS 0.5 02/01/2014 0955   EOSABS 0.3 02/01/2014 0955   BASOSABS 0.1 02/01/2014 0955   CMP     Component Value Date/Time   NA 137 02/01/2014 0955   K 4.8 02/01/2014 0955   CL 104 02/01/2014 0955   CO2 26 02/01/2014 0955   GLUCOSE 166* 02/01/2014 0955   BUN 28* 02/01/2014 0955   CREATININE 1.28* 02/01/2014 0955   CALCIUM 9.1 02/01/2014 0955   PROT 7.4 02/01/2014 0955   ALBUMIN 3.9 02/01/2014 0955   AST 29 02/01/2014 0955   ALT 25 02/01/2014 0955   ALKPHOS 79 02/01/2014 0955   BILITOT 0.4 02/01/2014 0955   GFRNONAA 38* 02/01/2014 0955   GFRAA 44* 02/01/2014 0955       ASSESSMENT and THERAPY PLAN:    Stage I left-sided Breast cancer Pleasant 81 year old female with a history of stage I carcinoma of the breast diagnosed back in 2007. Tumor was ER positive and HER-2 negative. She underwent lumpectomy, adjuvant radiation, and 5 years of tamoxifen. She remains without obvious recurrence. We will set her up for her next screening mammogram. We will continue with yearly observation breast exam today is unremarkable. She has  interim problems or concerns and advised her to call.   Iron deficiency She has a mild anemia and stage III chronic kidney disease. Her counts are for the most part stable. I did discuss with her additional anemia evaluation if her counts should worsen. She has a history of iron deficiency and we will add iron studies to her labs today. I advised her I will call her if she needs any iron replacement otherwise we will also check CBC at her next follow-up.     All questions were answered. The patient knows to call the clinic with any problems, questions or concerns. We can certainly see the patient much sooner if necessary Molli Hazard 02/06/2014

## 2014-02-01 NOTE — Patient Instructions (Signed)
[place] at [facility]  Discharge Instructions:  We will call you with the results of your iron test when it is available. We have scheduled your mammogram for next September Please call in the interim with any other problems or concerns _______________________________________________________________  Thank you for choosing [place] at [facility] to provide your oncology and hematology care.  To afford each patient quality time with our providers, please arrive at least 15 minutes before your scheduled appointment.  You need to re-schedule your appointment if you arrive 10 or more minutes late.  We strive to give you quality time with our providers, and arriving late affects you and other patients whose appointments are after yours.  Also, if you no show three or more times for appointments you may be dismissed from the clinic.  Again, thank you for choosing [place] at [place] hope is that these requests will allow you access to exceptional care and in a timely manner. _______________________________________________________________  If you have questions after your visit, please contact our office at [phone] between the hours of 8:30 a.m. and 5:00 p.m. Voicemails left after 4:30 p.m. will not be returned until the following business day. _______________________________________________________________  For prescription refill requests, have your pharmacy contact our office. _______________________________________________________________  Recommendations made by the consultant and any test results will be sent to your referring physician. _______________________________________________________________

## 2014-02-06 ENCOUNTER — Other Ambulatory Visit (HOSPITAL_COMMUNITY): Payer: Self-pay | Admitting: Hematology & Oncology

## 2014-02-06 ENCOUNTER — Encounter (HOSPITAL_COMMUNITY): Payer: Self-pay | Admitting: Hematology & Oncology

## 2014-02-06 NOTE — Assessment & Plan Note (Signed)
Pleasant 81-year-old female with a history of stage I carcinoma of the breast diagnosed back in 2007. Tumor was ER positive and HER-2 negative. She underwent lumpectomy, adjuvant radiation, and 5 years of tamoxifen. She remains without obvious recurrence. We will set her up for her next screening mammogram. We will continue with yearly observation breast exam today is unremarkable. She has interim problems or concerns and advised her to call. 

## 2014-02-06 NOTE — Assessment & Plan Note (Signed)
She has a mild anemia and stage III chronic kidney disease. Her counts are for the most part stable. I did discuss with her additional anemia evaluation if her counts should worsen. She has a history of iron deficiency and we will add iron studies to her labs today. I advised her I will call her if she needs any iron replacement otherwise we will also check CBC at her next follow-up.

## 2014-02-08 ENCOUNTER — Other Ambulatory Visit (HOSPITAL_COMMUNITY): Payer: Self-pay | Admitting: *Deleted

## 2014-02-08 DIAGNOSIS — D509 Iron deficiency anemia, unspecified: Secondary | ICD-10-CM

## 2014-02-10 ENCOUNTER — Encounter (HOSPITAL_BASED_OUTPATIENT_CLINIC_OR_DEPARTMENT_OTHER): Payer: Medicare PPO

## 2014-02-10 DIAGNOSIS — N183 Chronic kidney disease, stage 3 (moderate): Secondary | ICD-10-CM | POA: Diagnosis not present

## 2014-02-10 DIAGNOSIS — E611 Iron deficiency: Secondary | ICD-10-CM

## 2014-02-10 DIAGNOSIS — D500 Iron deficiency anemia secondary to blood loss (chronic): Secondary | ICD-10-CM

## 2014-02-10 MED ORDER — SODIUM CHLORIDE 0.9 % IJ SOLN
10.0000 mL | Freq: Once | INTRAMUSCULAR | Status: AC
  Administered 2014-02-10: 10 mL via INTRAVENOUS

## 2014-02-10 MED ORDER — SODIUM CHLORIDE 0.9 % IV SOLN
Freq: Once | INTRAVENOUS | Status: AC
  Administered 2014-02-10: 10:00:00 via INTRAVENOUS

## 2014-02-10 MED ORDER — SODIUM CHLORIDE 0.9 % IJ SOLN
250.0000 mg | Freq: Once | INTRAVENOUS | Status: AC
  Administered 2014-02-10: 250 mg via INTRAVENOUS
  Filled 2014-02-10: qty 20

## 2014-02-10 NOTE — Patient Instructions (Signed)
[place] at [facility] Discharge Instructions  RECOMMENDATIONS MADE BY THE CONSULTANT AND ANY TEST RESULTS WILL BE SENT TO YOUR REFERRING PHYSICIAN.  Today you received an iron infusion. You were given 3 stool cards with instructions for collection. When you have collected all 3 cards please return them to the clinic for testing. Fecal Occult Blood Test This is a test done on a stool specimen to screen for gastrointestinal bleeding, which may be an indicator of colon cancer Is is usually done as part of a routine examination, annually, after age 64 or as directed by your caregiver. The fecal occult blood test (FOBT) checks for blood in your stool. Normally, there will not be enough blood lost through the gastrointestinal tract to turn an FOBT positive or for you to notice it visually in the form of bloody or dark, tarry stools. Any significant amount of blood being passed should be investigated.  A positive FOBT will tell your caregiver that you have bleeding occurring somewhere in your gastrointestinal tract. This blood loss could be due to ulcers, diverticulosis, bleeding polyps, inflammatory bowel disease, hemorrhoids, from swallowed blood due to bleeding gums or nosebleeds, or it could be due to benign or cancerous tumors. Anything that protrudes into the lumen (the empty space in the intestine), like a polyp or tumor, and is rubbed against by the fecal waste as it passes through has the potential to eventually bleed intermittently. Often this small amount of blood is the first, and sometimes the only, symptom of early colon cancer, making the FOBT a valuable screening tool. PREPARATION FOR TEST  You should not eat red meat within three days before testing. Other substances that could cause a false positive test result include fish, turnips, horseradish, and drugs such as colchicines and oxidizing drugs (for example, iodine and boric acid). Be sure to carefully follow your  caregiver's instructions. With FOBT, your caregiver or laboratory will give you one or more test "cards." You collect a separate sample from three different stools, usually on consecutive days. Each stool sample should be collected into a clean container and should not be contaminated with urine or water. The slide is labeled with your name and the date; then, with an applicator stick, you apply a thin smear of stool onto each filter paper square/window contained on the card. Allow the filter paper to dry. Once it is dry, it is stable. Usually you will collect all of the consecutive samples, and then return all of them to your caregiver or laboratory at the same time, sometimes by mailing them. There are also over the counter tests which are dropped in your toilet. NORMAL FINDINGS   No occult blood within the stool.  The FOBT test is normally negative. A positive indicates either blood in the stool or an interfering substance. Multiple samples are done to: 1) catch intermittent bleeding; and 2) help rule out false positives. Ranges for normal findings may vary among different laboratories and hospitals. You should always check with your doctor after having lab work or other tests done to discuss the meaning of your test results and whether your values are considered within normal limits. MEANING OF TEST  Your caregiver will go over the test results with you and discuss the importance and meaning of your results, as well as treatment options and the need for additional tests if necessary. OBTAINING THE TEST RESULTS  It is your responsibility to obtain your test results. Ask the lab or department performing  the test when and how you will get your results. Document Released: 01/25/2004 Document Revised: 03/24/2011 Document Reviewed: 12/10/2007 [facility] Patient Information 2015 [place], [place]. This information is not intended to replace advice given to you by your health care provider. Make sure you  discuss any questions you have with your health care provider.   Thank you for choosing [place] at [facility] to provide your oncology and hematology care.  To afford each patient quality time with our provider, please arrive at least 15 minutes before your scheduled appointment time.    You need to re-schedule your appointment should you arrive 10 or more minutes late.  We strive to give you quality time with our providers, and arriving late affects you and other patients whose appointments are after yours.  Also, if you no show three or more times for appointments you may be dismissed from the clinic at the providers discretion.     Again, thank you for choosing [facility]  Our hope is that these requests will decrease the amount of time that you wait before being seen by our physicians.       _____________________________________________________________  Should you have questions after your visit to [facility], please contact our office at [phone] between the hours of 8:30 a.m. and 4:30 p.m.  Voicemails left after 4:30 p.m. will not be returned until the following business day.  For prescription refill requests, have your pharmacy contact our office.

## 2014-02-10 NOTE — Progress Notes (Signed)
Tolerated iron infusion well. 

## 2014-02-17 ENCOUNTER — Other Ambulatory Visit (HOSPITAL_COMMUNITY): Payer: Self-pay

## 2014-02-17 DIAGNOSIS — D509 Iron deficiency anemia, unspecified: Secondary | ICD-10-CM

## 2014-02-17 LAB — OCCULT BLOOD X 1 CARD TO LAB, STOOL
Fecal Occult Bld: NEGATIVE
Fecal Occult Bld: NEGATIVE
Fecal Occult Bld: NEGATIVE

## 2014-02-17 NOTE — Progress Notes (Unsigned)
Patient returned 3 stool cards to the clinic. Specimens sent to the lab.

## 2014-03-01 LAB — CANCER ANTIGEN 27.29: CA 27.29: 26.4 U/mL (ref 0.0–38.6)

## 2014-10-04 ENCOUNTER — Other Ambulatory Visit (HOSPITAL_COMMUNITY): Payer: Self-pay | Admitting: Hematology & Oncology

## 2014-10-04 ENCOUNTER — Ambulatory Visit (HOSPITAL_COMMUNITY)
Admission: RE | Admit: 2014-10-04 | Discharge: 2014-10-04 | Disposition: A | Discharge status: Home / Self Care | Payer: Medicare PPO | Source: Ambulatory Visit | Attending: Hematology & Oncology | Admitting: Hematology & Oncology

## 2014-10-04 DIAGNOSIS — Z1231 Encounter for screening mammogram for malignant neoplasm of breast: Secondary | ICD-10-CM

## 2014-10-04 DIAGNOSIS — Z139 Encounter for screening, unspecified: Secondary | ICD-10-CM

## 2015-01-31 ENCOUNTER — Encounter (HOSPITAL_COMMUNITY): Payer: Medicare PPO | Attending: Hematology & Oncology

## 2015-01-31 DIAGNOSIS — C50912 Malignant neoplasm of unspecified site of left female breast: Secondary | ICD-10-CM | POA: Insufficient documentation

## 2015-01-31 LAB — COMPREHENSIVE METABOLIC PANEL
ALT: 27 U/L (ref 14–54)
AST: 36 U/L (ref 15–41)
Albumin: 3.9 g/dL (ref 3.5–5.0)
Alkaline Phosphatase: 75 U/L (ref 38–126)
Anion gap: 9 (ref 5–15)
BILIRUBIN TOTAL: 0.6 mg/dL (ref 0.3–1.2)
BUN: 36 mg/dL — AB (ref 6–20)
CO2: 27 mmol/L (ref 22–32)
CREATININE: 1.32 mg/dL — AB (ref 0.44–1.00)
Calcium: 9.5 mg/dL (ref 8.9–10.3)
Chloride: 104 mmol/L (ref 101–111)
GFR calc Af Amer: 42 mL/min — ABNORMAL LOW (ref >60)
GFR, EST NON AFRICAN AMERICAN: 36 mL/min — AB (ref >60)
GLUCOSE: 206 mg/dL — AB (ref 65–99)
Potassium: 4.4 mmol/L (ref 3.5–5.1)
Sodium: 140 mmol/L (ref 135–145)
Total Protein: 7.7 g/dL (ref 6.5–8.1)

## 2015-01-31 LAB — CBC
HEMATOCRIT: 37.9 % (ref 36.0–46.0)
Hemoglobin: 12.2 g/dL (ref 12.0–15.0)
MCH: 29.8 pg (ref 26.0–34.0)
MCHC: 32.2 g/dL (ref 30.0–36.0)
MCV: 92.4 fL (ref 78.0–100.0)
PLATELETS: 230 10*3/uL (ref 150–400)
RBC: 4.1 MIL/uL (ref 3.87–5.11)
RDW: 13.6 % (ref 11.5–15.5)
WBC: 9.6 10*3/uL (ref 4.0–10.5)

## 2015-01-31 LAB — VITAMIN B12: VITAMIN B 12: 131 pg/mL — AB (ref 180–914)

## 2015-02-01 ENCOUNTER — Encounter (HOSPITAL_COMMUNITY): Payer: Medicare PPO

## 2015-02-01 ENCOUNTER — Ambulatory Visit (HOSPITAL_COMMUNITY): Payer: Medicare PPO

## 2015-02-01 ENCOUNTER — Encounter (HOSPITAL_COMMUNITY): Payer: Self-pay | Admitting: Hematology & Oncology

## 2015-02-01 ENCOUNTER — Encounter (HOSPITAL_BASED_OUTPATIENT_CLINIC_OR_DEPARTMENT_OTHER): Payer: Medicare PPO | Admitting: Hematology & Oncology

## 2015-02-01 ENCOUNTER — Ambulatory Visit (HOSPITAL_COMMUNITY): Payer: Medicare PPO | Admitting: Hematology & Oncology

## 2015-02-01 VITALS — BP 144/65 | HR 78 | Resp 18 | Wt 230.0 lb

## 2015-02-01 DIAGNOSIS — Z853 Personal history of malignant neoplasm of breast: Secondary | ICD-10-CM | POA: Diagnosis not present

## 2015-02-01 DIAGNOSIS — D509 Iron deficiency anemia, unspecified: Secondary | ICD-10-CM | POA: Diagnosis not present

## 2015-02-01 DIAGNOSIS — N183 Chronic kidney disease, stage 3 (moderate): Secondary | ICD-10-CM | POA: Diagnosis not present

## 2015-02-01 DIAGNOSIS — Z1239 Encounter for other screening for malignant neoplasm of breast: Secondary | ICD-10-CM

## 2015-02-01 DIAGNOSIS — E538 Deficiency of other specified B group vitamins: Secondary | ICD-10-CM | POA: Diagnosis not present

## 2015-02-01 DIAGNOSIS — C50912 Malignant neoplasm of unspecified site of left female breast: Secondary | ICD-10-CM | POA: Diagnosis not present

## 2015-02-01 LAB — FERRITIN: Ferritin: 39 ng/mL (ref 11–307)

## 2015-02-01 MED ORDER — CYANOCOBALAMIN 1000 MCG/ML IJ SOLN
INTRAMUSCULAR | Status: AC
Start: 2015-02-01 — End: 2015-02-01
  Filled 2015-02-01: qty 1

## 2015-02-01 MED ORDER — CYANOCOBALAMIN 1000 MCG/ML IJ SOLN
1000.0000 ug | Freq: Once | INTRAMUSCULAR | Status: AC
  Administered 2015-02-01: 1000 ug via INTRAMUSCULAR

## 2015-02-01 NOTE — Patient Instructions (Addendum)
[place] at [facility] Discharge Instructions  RECOMMENDATIONS MADE BY THE CONSULTANT AND ANY TEST RESULTS WILL BE SENT TO YOUR REFERRING PHYSICIAN.    Exam and discussion completed by Dr Whitney Muse today Ferritin level added on, this is still pending  You are b12 deficent  We are going to start you are B12 injections 4 of those weekly Then monthly.  If you have someone to do those then you can do those at home. First B12 today Mammogram scheduled Biotene to help with your dry mouth Vaseline to help with your dry lips Clinical breast exam today  Return to see the doctor in 1 year with labs Please call the clinic if you have any questions or concerns    Thank you for choosing [place] at [facility] to provide your oncology and hematology care.  To afford each patient quality time with our provider, please arrive at least 15 minutes before your scheduled appointment time.   Beginning January 23rd 2017 lab work for the [facility] will be done in the  Main lab at [facility] on 1st floor. If you have a lab appointment with the [facility] please come in thru the  Main Entrance and check in at the main information desk  You need to re-schedule your appointment should you arrive 10 or more minutes late.  We strive to give you quality time with our providers, and arriving late affects you and other patients whose appointments are after yours.  Also, if you no show three or more times for appointments you may be dismissed from the clinic at the providers discretion.     Again, thank you for choosing [facility].  Our hope is that these requests will decrease the amount of time that you wait before being seen by our physicians.       _____________________________________________________________  Should you have questions after your visit to [place], please contact our office at [phone] between the hours  of 8:30 a.m. and 4:30 p.m.  Voicemails left after 4:30 p.m. will not be returned until the following business day.  For prescription refill requests, have your pharmacy contact our office.

## 2015-02-01 NOTE — Progress Notes (Signed)
Anna Hampshire, MD [place] [address]  Stage I (T1A NO MO) left sided breast invasive carcinoma, status post 5 years worth of tamoxifen, lumpectomy, and adjuvant radiation therapy  Fatty infiltration of the liver with mildly elevated liver enzymes, followed by gastroenterology  Bilateral mammogram on 10/05/2014 Anemia, hemoccults negative X 3 2015,  Iron deficiency CKD, stage III   CURRENT THERAPY: observation  INTERVAL HISTORY: Anna Dickerson 82 y.o. female returns for follow-up of her early stage breast cancer. She has had 2 knee replacements and they occasionally bother her. She is having eye injections in [place].  She has noticed an improvement in her vision.  Anna Dickerson is here alone today. I personally reviewed and went over laboratory results with the patient. These labs showed low B12 levels. The patient denies ever having B12 shots before. She has several nurses in her family and believes that one will be willing to help with her shots.   Reports that she has to be careful walking due to previous knee replacements. She has only fallen once when she missed the chair. Her appetite is good. Her son is currently staying with her. She has a LifeAlert.   She complains of chapped lips and dry mouth. She has never used biotene.    MEDICAL HISTORY: Past Medical History  Diagnosis Date  . Cancer [facility]) [id number]    breast  . Diabetes mellitus   . Hypertension   . Macular degeneration, left eye   . Stage I left-sided Breast cancer 12/31/2010  . Obesity 12/31/2010  . HTN (hypertension) 12/31/2010  . Macular degeneration 12/31/2010  . DM (diabetes mellitus) ([place]) 12/31/2010  . Iron deficiency 02/24/2012    has Stage I left-sided Breast cancer; Obesity; HTN (hypertension); Macular degeneration; DM (diabetes mellitus) ([place]); Iron deficiency; and Iron deficiency anemia due to chronic blood loss on her problem list.     No history exists.     is allergic to sulfa antibiotics.  We administered Influenza vac split quadrivalent PF.  SURGICAL HISTORY: Past Surgical History  Procedure Laterality Date  . Replacement total knee bilateral    . Bladder tacked-up    . Breast surgery  left    . Bilateral cataract with intraocular lens implant      SOCIAL HISTORY: Social History   Social History  . Marital Status: Married    Spouse Name: N/A  . Number of Children: N/A  . Years of Education: N/A   Occupational History  . Not on file.   Social History Main Topics  . Smoking status: Never Smoker   . Smokeless tobacco: Never Used  . Alcohol Use: No  . Drug Use: No  . Sexual Activity: Not on file   Other Topics Concern  . Not on file   Social History Narrative  Widowed for two years.  FAMILY HISTORY: Family History  Problem Relation Age of Onset  . Cancer Mother     Review of Systems  Constitutional: Negative.   HENT: Negative.        Dry mouth  Eyes: Negative.   Respiratory: Negative.   Cardiovascular: Negative.   Musculoskeletal: Positive for joint pain.       Knee pain. 2 previous knee replacements.  Skin: Negative.   Neurological: Negative.   Endo/Heme/Allergies: Negative.   Psychiatric/Behavioral: Negative.     PHYSICAL EXAMINATION  ECOG PERFORMANCE STATUS: 0 - Asymptomatic  Filed Vitals:   02/01/15 1128  BP: 144/65  Pulse: 78  Resp: 18    Physical Exam  Constitutional: She is oriented to person, place, and time and well-developed, well-nourished, and in no distress.  obese  HENT:  Head: Normocephalic and atraumatic.  Nose: Nose normal.  Mouth/Throat: Oropharynx is clear and moist. No oropharyngeal exudate.  Eyes: Conjunctivae and EOM are normal. Pupils are equal, round, and reactive to light. Right eye exhibits no discharge. Left eye exhibits no discharge. No scleral icterus.  Neck: Normal range of motion. Neck supple. No tracheal deviation present. No thyromegaly present.    Cardiovascular: Normal rate, regular rhythm and normal heart sounds.  Exam reveals no gallop and no friction rub.   No murmur heard. Pulmonary/Chest: Effort normal and breath sounds normal. She has no wheezes. She has no rales.    Abdominal: Soft. Bowel sounds are normal. She exhibits no distension and no mass. There is no tenderness. There is no rebound and no guarding.  Musculoskeletal: Normal range of motion. She exhibits no edema.  NEEDS SOME ASSISTANCE TO GET ONTO EXAM TABLE.  Lymphadenopathy:    She has no cervical adenopathy.  Neurological: She is alert and oriented to person, place, and time. She has normal reflexes. No cranial nerve deficit. Gait normal. Coordination normal.  Skin: Skin is warm and dry. No rash noted.  Psychiatric: Mood, memory, affect and judgment normal.  Nursing note and vitals reviewed.   LABORATORY DATA: I have reviewed the data as listed. CBC    Component Value Date/Time   WBC 9.6 01/31/2015 0955   RBC 4.10 01/31/2015 0955   HGB 12.2 01/31/2015 0955   HCT 37.9 01/31/2015 0955   PLT 230 01/31/2015 0955   MCV 92.4 01/31/2015 0955   MCH 29.8 01/31/2015 0955   MCHC 32.2 01/31/2015 0955   RDW 13.6 01/31/2015 0955   LYMPHSABS 3.7 02/01/2014 0955   MONOABS 0.5 02/01/2014 0955   EOSABS 0.3 02/01/2014 0955   BASOSABS 0.1 02/01/2014 0955   CMP     Component Value Date/Time   NA 140 01/31/2015 0955   K 4.4 01/31/2015 0955   CL 104 01/31/2015 0955   CO2 27 01/31/2015 0955   GLUCOSE 206* 01/31/2015 0955   BUN 36* 01/31/2015 0955   CREATININE 1.32* 01/31/2015 0955   CALCIUM 9.5 01/31/2015 0955   PROT 7.7 01/31/2015 0955   ALBUMIN 3.9 01/31/2015 0955   AST 36 01/31/2015 0955   ALT 27 01/31/2015 0955   ALKPHOS 75 01/31/2015 0955   BILITOT 0.6 01/31/2015 0955   GFRNONAA 36* 01/31/2015 0955   GFRAA 42* 01/31/2015 0955   Results for Anna Dickerson, Anna Dickerson (MRN [id number]) as of 02/15/2015 17:27  Ref. Range 02/17/2014 15:59 02/17/2014 15:59 02/17/2014 15:59  10/04/2014 10:00 01/31/2015 09:55  Vitamin B-12 Latest Ref Range: 180-914 pg/mL     131 (L)    ASSESSMENT and THERAPY PLAN:   Stage I (T1A NO MO) left sided breast invasive carcinoma, status post 5 years worth of tamoxifen, lumpectomy, and adjuvant radiation therapy  Fatty infiltration of the liver with mildly elevated liver enzymes, followed by gastroenterology  Bilateral mammogram 10/05/2014  Anemia, hemoccults negative X 3 2015,  Iron deficiency CKD, stage III B12 deficiency  I will order her mammogram for September 2017. She currently remains with evidence of recurrence.  Based on labs today, she needs B12 injections. Ferritin levels are pending. We will call her with the results.   She will return for routine follow-up in one year, with repeats labs including CBC, B12, and ferritin check.  Orders  Placed This Encounter  Procedures  . MM SCREENING BREAST TOMO BILATERAL    Standing Status: Future     Number of Occurrences:      Standing Expiration Date: 03/31/2016    Order Specific Question:  Reason for Exam (SYMPTOM  OR DIAGNOSIS REQUIRED)    Answer:  screening, history breast ca    Order Specific Question:  Preferred imaging location?    Answer:  [facility]  . Ferritin    Standing Status: Future     Number of Occurrences: 1     Standing Expiration Date: 02/01/2016  . CBC with Differential    Standing Status: Future     Number of Occurrences:      Standing Expiration Date: 07/31/2016  . Comprehensive metabolic panel    Standing Status: Future     Number of Occurrences:      Standing Expiration Date: 07/31/2016  . Vitamin B12    Standing Status: Future     Number of Occurrences:      Standing Expiration Date: 07/31/2016  . Ferritin    Standing Status: Future     Number of Occurrences:      Standing Expiration Date: 07/31/2016    All questions were answered. The patient knows to call the clinic with any problems, questions or concerns. We can certainly see the  patient much sooner if necessary.  This document serves as a record of services personally performed by Ancil Linsey, MD. It was created on her behalf by Arlyce Harman, a trained medical scribe. The creation of this record is based on the scribe's personal observations and the provider's statements to them. This document has been checked and approved by the attending provider.  I have reviewed the above documentation for accuracy and completeness, and I agree with the above.  Molli Hazard, MD  02/01/2015

## 2015-02-01 NOTE — Progress Notes (Signed)
..  Anna Dickerson presents today for injection per the provider's orders.  Vitamin b12 administration without incident; see MAR for injection details.  Patient tolerated procedure well and without incident.  No questions or complaints noted at this time.

## 2015-02-08 ENCOUNTER — Encounter (HOSPITAL_COMMUNITY): Payer: Self-pay

## 2015-02-08 ENCOUNTER — Encounter (HOSPITAL_BASED_OUTPATIENT_CLINIC_OR_DEPARTMENT_OTHER): Payer: Medicare PPO

## 2015-02-08 VITALS — BP 160/64 | HR 108 | Temp 97.5°F | Resp 16

## 2015-02-08 DIAGNOSIS — E538 Deficiency of other specified B group vitamins: Secondary | ICD-10-CM | POA: Diagnosis not present

## 2015-02-08 HISTORY — DX: Deficiency of other specified B group vitamins: E53.8

## 2015-02-08 MED ORDER — CYANOCOBALAMIN 1000 MCG/ML IJ SOLN
1000.0000 ug | Freq: Once | INTRAMUSCULAR | Status: AC
  Administered 2015-02-08: 1000 ug via INTRAMUSCULAR

## 2015-02-08 MED ORDER — CYANOCOBALAMIN 1000 MCG/ML IJ SOLN
INTRAMUSCULAR | Status: AC
  Filled 2015-02-08: qty 1

## 2015-02-08 NOTE — Patient Instructions (Signed)
[place] at [facility] Discharge Instructions  RECOMMENDATIONS MADE BY THE CONSULTANT AND ANY TEST RESULTS WILL BE SENT TO YOUR REFERRING PHYSICIAN.  B12 today.   Please see schedule for return appointments.    Thank you for choosing [place] at [facility] to provide your oncology and hematology care.  To afford each patient quality time with our provider, please arrive at least 15 minutes before your scheduled appointment time.   Beginning January 23rd 2017 lab work for the [facility] will be done in the  Main lab at [facility] on 1st floor. If you have a lab appointment with the [facility] please come in thru the  Main Entrance and check in at the main information desk  You need to re-schedule your appointment should you arrive 10 or more minutes late.  We strive to give you quality time with our providers, and arriving late affects you and other patients whose appointments are after yours.  Also, if you no show three or more times for appointments you may be dismissed from the clinic at the providers discretion.     Again, thank you for choosing [facility].  Our hope is that these requests will decrease the amount of time that you wait before being seen by our physicians.       _____________________________________________________________  Should you have questions after your visit to [facility], please contact our office at [phone] between the hours of 8:30 a.m. and 4:30 p.m.  Voicemails left after 4:30 p.m. will not be returned until the following business day.  For prescription refill requests, have your pharmacy contact our office.

## 2015-02-08 NOTE — Progress Notes (Signed)
Anna Dickerson presents today for injection per MD orders. B12 1031mcg administered IM in left Upper Arm. Administration without incident. Patient tolerated well. Patient was escorted out due to slow gait and potential fall risk.

## 2015-02-09 ENCOUNTER — Other Ambulatory Visit (HOSPITAL_COMMUNITY): Payer: Self-pay | Admitting: Hematology & Oncology

## 2015-02-15 ENCOUNTER — Encounter (HOSPITAL_COMMUNITY): Payer: Medicare PPO | Attending: Hematology & Oncology

## 2015-02-15 VITALS — BP 176/84 | HR 101 | Temp 98.0°F | Resp 18

## 2015-02-15 DIAGNOSIS — E538 Deficiency of other specified B group vitamins: Secondary | ICD-10-CM | POA: Diagnosis not present

## 2015-02-15 DIAGNOSIS — C50912 Malignant neoplasm of unspecified site of left female breast: Secondary | ICD-10-CM | POA: Insufficient documentation

## 2015-02-15 MED ORDER — CYANOCOBALAMIN 1000 MCG/ML IJ SOLN
1000.0000 ug | Freq: Once | INTRAMUSCULAR | Status: AC
  Administered 2015-02-15: 1000 ug via INTRAMUSCULAR

## 2015-02-15 MED ORDER — CYANOCOBALAMIN 1000 MCG/ML IJ SOLN
INTRAMUSCULAR | Status: AC
  Filled 2015-02-15: qty 1

## 2015-02-15 NOTE — Progress Notes (Signed)
Anna Dickerson presents today for injection per MD orders. B12 1097mcg administered IM in right Upper Arm. Administration without incident. Patient tolerated well.

## 2015-02-15 NOTE — Patient Instructions (Signed)
[place] at [facility] Discharge Instructions  RECOMMENDATIONS MADE BY THE CONSULTANT AND ANY TEST RESULTS WILL BE SENT TO YOUR REFERRING PHYSICIAN.  B12 today.   Please see schedule for return appointments.    Thank you for choosing [place] at [facility] to provide your oncology and hematology care.  To afford each patient quality time with our provider, please arrive at least 15 minutes before your scheduled appointment time.   Beginning January 23rd 2017 lab work for the [facility] will be done in the  Main lab at [facility] on 1st floor. If you have a lab appointment with the [place] please come in thru the  Main Entrance and check in at the main information desk  You need to re-schedule your appointment should you arrive 10 or more minutes late.  We strive to give you quality time with our providers, and arriving late affects you and other patients whose appointments are after yours.  Also, if you no show three or more times for appointments you may be dismissed from the clinic at the providers discretion.     Again, thank you for choosing [facility].  Our hope is that these requests will decrease the amount of time that you wait before being seen by our physicians.       _____________________________________________________________  Should you have questions after your visit to [facility], please contact our office at [phone] between the hours of 8:30 a.m. and 4:30 p.m.  Voicemails left after 4:30 p.m. will not be returned until the following business day.  For prescription refill requests, have your pharmacy contact our office.

## 2015-02-19 ENCOUNTER — Encounter (HOSPITAL_BASED_OUTPATIENT_CLINIC_OR_DEPARTMENT_OTHER): Payer: Medicare PPO

## 2015-02-19 VITALS — BP 142/62 | HR 92 | Temp 98.2°F | Resp 18

## 2015-02-19 DIAGNOSIS — D509 Iron deficiency anemia, unspecified: Secondary | ICD-10-CM

## 2015-02-19 DIAGNOSIS — N183 Chronic kidney disease, stage 3 (moderate): Secondary | ICD-10-CM

## 2015-02-19 DIAGNOSIS — E611 Iron deficiency: Secondary | ICD-10-CM

## 2015-02-19 MED ORDER — SODIUM CHLORIDE 0.9 % IV SOLN
INTRAVENOUS | Status: DC
  Administered 2015-02-19: 15:00:00 via INTRAVENOUS

## 2015-02-19 MED ORDER — SODIUM CHLORIDE 0.9 % IV SOLN
125.0000 mg | Freq: Once | INTRAVENOUS | Status: AC
  Administered 2015-02-19: 125 mg via INTRAVENOUS
  Filled 2015-02-19: qty 10

## 2015-02-19 MED ORDER — SODIUM CHLORIDE 0.9% FLUSH
10.0000 mL | Freq: Once | INTRAVENOUS | Status: AC
Start: 2015-02-19 — End: 2015-02-19
  Administered 2015-02-19: 10 mL via INTRAVENOUS

## 2015-02-19 NOTE — Patient Instructions (Signed)
[facility] at [facility] Discharge Instructions  RECOMMENDATIONS MADE BY THE CONSULTANT AND ANY TEST RESULTS WILL BE SENT TO YOUR REFERRING PHYSICIAN.  Ferric gluconate 125 mg iron infusion given as ordered. Return as scheduled.  Thank you for choosing  [facility] at [facility] to provide your oncology and hematology care.  To afford each patient quality time with our provider, please arrive at least 15 minutes before your scheduled appointment time.   Beginning January 23rd 2017 lab work for the Cancer Center will be done in the  Main lab at [facility] on 1st floor. If you have a lab appointment with the Cancer Center please come in thru the  Main Entrance and check in at the main information desk  You need to re-schedule your appointment should you arrive 10 or more minutes late.  We strive to give you quality time with our providers, and arriving late affects you and other patients whose appointments are after yours.  Also, if you no show three or more times for appointments you may be dismissed from the clinic at the providers discretion.     Again, thank you for choosing [facility].  Our hope is that these requests will decrease the amount of time that you wait before being seen by our physicians.       _____________________________________________________________  Should you have questions after your visit to [facility], please contact our office at [phone] between the hours of 8:30 a.m. and 4:30 p.m.  Voicemails left after 4:30 p.m. will not be returned until the following business day.  For prescription refill requests, have your pharmacy contact our office.    

## 2015-02-19 NOTE — Progress Notes (Signed)
Tolerated iron infusion well. Ambulatory on discharge home to self. 

## 2015-02-22 ENCOUNTER — Encounter (HOSPITAL_BASED_OUTPATIENT_CLINIC_OR_DEPARTMENT_OTHER): Payer: Medicare PPO

## 2015-02-22 ENCOUNTER — Encounter (HOSPITAL_COMMUNITY): Payer: Medicare PPO

## 2015-02-22 VITALS — BP 140/60 | HR 89 | Temp 97.5°F | Resp 18

## 2015-02-22 DIAGNOSIS — E611 Iron deficiency: Secondary | ICD-10-CM

## 2015-02-22 DIAGNOSIS — D509 Iron deficiency anemia, unspecified: Secondary | ICD-10-CM

## 2015-02-22 DIAGNOSIS — E538 Deficiency of other specified B group vitamins: Secondary | ICD-10-CM

## 2015-02-22 MED ORDER — CYANOCOBALAMIN 1000 MCG/ML IJ SOLN
1000.0000 ug | Freq: Once | INTRAMUSCULAR | Status: AC
  Administered 2015-02-22: 1000 ug via INTRAMUSCULAR
  Filled 2015-02-22: qty 1

## 2015-02-22 MED ORDER — SODIUM CHLORIDE 0.9 % IV SOLN
125.0000 mg | Freq: Once | INTRAVENOUS | Status: AC
  Administered 2015-02-22: 125 mg via INTRAVENOUS
  Filled 2015-02-22: qty 10

## 2015-02-22 MED ORDER — SODIUM CHLORIDE 0.9 % IV SOLN
INTRAVENOUS | Status: DC
Start: 2015-02-22 — End: 2015-02-22
  Administered 2015-02-22: 13:00:00 via INTRAVENOUS

## 2015-02-22 NOTE — Patient Instructions (Signed)
[place] at [facility] Discharge Instructions  RECOMMENDATIONS MADE BY THE CONSULTANT AND ANY TEST RESULTS WILL BE SENT TO YOUR REFERRING PHYSICIAN.  Vitamin B12 1000 mcg injection given today as ordered. Return as scheduled.  Thank you for choosing [place] at [facility] to provide your oncology and hematology care.  To afford each patient quality time with our provider, please arrive at least 15 minutes before your scheduled appointment time.   Beginning January 23rd 2017 lab work for the [facility] will be done in the  Main lab at [facility] on 1st floor. If you have a lab appointment with the [place] please come in thru the  Main Entrance and check in at the main information desk  You need to re-schedule your appointment should you arrive 10 or more minutes late.  We strive to give you quality time with our providers, and arriving late affects you and other patients whose appointments are after yours.  Also, if you no show three or more times for appointments you may be dismissed from the clinic at the providers discretion.     Again, thank you for choosing [facility].  Our hope is that these requests will decrease the amount of time that you wait before being seen by our physicians.       _____________________________________________________________  Should you have questions after your visit to [facility], please contact our office at [phone] between the hours of 8:30 a.m. and 4:30 p.m.  Voicemails left after 4:30 p.m. will not be returned until the following business day.  For prescription refill requests, have your pharmacy contact our office.

## 2015-02-22 NOTE — Patient Instructions (Signed)
[place] at [facility] Discharge Instructions  RECOMMENDATIONS MADE BY THE CONSULTANT AND ANY TEST RESULTS WILL BE SENT TO YOUR REFERRING PHYSICIAN.  IV iron and B12 injection today.   We will discuss having your B12 injections done at home after your next injection.    Thank you for choosing [place] at [facility] to provide your oncology and hematology care.  To afford each patient quality time with our provider, please arrive at least 15 minutes before your scheduled appointment time.   Beginning January 23rd 2017 lab work for the [facility] will be done in the  Main lab at [facility] on 1st floor. If you have a lab appointment with the [place] please come in thru the  Main Entrance and check in at the main information desk  You need to re-schedule your appointment should you arrive 10 or more minutes late.  We strive to give you quality time with our providers, and arriving late affects you and other patients whose appointments are after yours.  Also, if you no show three or more times for appointments you may be dismissed from the clinic at the providers discretion.     Again, thank you for choosing [facility].  Our hope is that these requests will decrease the amount of time that you wait before being seen by our physicians.       _____________________________________________________________  Should you have questions after your visit to [facility], please contact our office at [phone] between the hours of 8:30 a.m. and 4:30 p.m.  Voicemails left after 4:30 p.m. will not be returned until the following business day.  For prescription refill requests, have your pharmacy contact our office.

## 2015-02-22 NOTE — Progress Notes (Signed)
Please see other encounter for documentation.   

## 2015-02-22 NOTE — Progress Notes (Signed)
[name] presents today for injection per MD orders. B12 1032mcg administered IM in right Upper Arm. Administration without incident. Patient tolerated well. Patient tolerated iron infusion well.  VSS.    Patient and son requested that she is able to have her injections done at home, as he is willing to give them. Tammy, RN, and I informed them that we would ask the MD at her next appointment because Dr.  Muse usually prefers for her to get her first 4 weekly injections at the clinic, but after that we would allow them to be done at home if Dr.  Muse agrees.  They verbalized understanding.

## 2015-03-01 ENCOUNTER — Encounter (HOSPITAL_COMMUNITY): Payer: Medicare PPO

## 2015-03-01 MED ORDER — CYANOCOBALAMIN 1000 MCG/ML IJ SOLN: 1000.0000 ug | Freq: Once | INTRAMUSCULAR | Status: DC

## 2015-03-05 ENCOUNTER — Encounter: Payer: Self-pay | Admitting: Family Medicine

## 2015-03-05 ENCOUNTER — Other Ambulatory Visit: Payer: Self-pay | Admitting: Family Medicine

## 2015-03-05 ENCOUNTER — Ambulatory Visit (INDEPENDENT_AMBULATORY_CARE_PROVIDER_SITE_OTHER): Payer: Medicare Other | Admitting: Family Medicine

## 2015-03-05 VITALS — BP 143/75 | HR 97 | Temp 97.0°F | Ht 64.75 in | Wt 227.0 lb

## 2015-03-05 DIAGNOSIS — J019 Acute sinusitis, unspecified: Secondary | ICD-10-CM | POA: Diagnosis not present

## 2015-03-05 MED ORDER — FLUTICASONE PROPIONATE 50 MCG/ACT NA SUSP: 1.0000 | Freq: Two times a day (BID) | NASAL | Status: DC | PRN

## 2015-03-05 NOTE — Progress Notes (Signed)
BP 143/75 mmHg  Pulse 97  Temp(Src) 97 F (36.1 C) (Oral)  Ht 5' 4.75" (1.645 m)  Wt 227 lb (102.967 kg)  BMI 38.05 kg/m2   Subjective:    Patient ID: Anna Dickerson, female    DOB: [birth year], 82 y.o.   MRN: [id number]  HPI: Anna Dickerson is a 82 y.o. female presenting on 03/05/2015 for Eye Problem and Sinusitis   HPI Acute sinus congestion Patient comes in today to establish care with our practice and because she's been having some sinus congestion and sinus pressure that is going into her left ear as well. She denies any fevers or chills or shortness of breath or wheezing. This is been going on for about 2 or 3 days. She hasn't really tried anything over-the-counter for it just yet. She is also been having some eye swelling and congestion in her left eye. She does get regular shots in her eyes because of her macular degeneration and she just had one 2 weeks ago. Her eyes been swollen and hurting since that time.  Relevant past medical, surgical, family and social history reviewed and updated as indicated. Interim medical history since our last visit reviewed. Allergies and medications reviewed and updated.  Review of Systems  Constitutional: Negative for fever and chills.  HENT: Positive for ear pain, postnasal drip, rhinorrhea, sinus pressure, sneezing and sore throat. Negative for congestion and ear discharge.   Eyes: Positive for pain, discharge and itching. Negative for redness and visual disturbance.  Respiratory: Negative for cough, chest tightness and shortness of breath.   Cardiovascular: Negative for chest pain and leg swelling.  Genitourinary: Negative for dysuria and difficulty urinating.  Musculoskeletal: Negative for back pain and gait problem.  Skin: Negative for rash.  Neurological: Negative for light-headedness and headaches.  Psychiatric/Behavioral: Negative for behavioral problems and agitation.  All other systems reviewed and are negative.   Per  HPI unless specifically indicated above  Social History   Social History  . Marital Status: Married    Spouse Name: N/A  . Number of Children: N/A  . Years of Education: N/A   Occupational History  . Not on file.   Social History Main Topics  . Smoking status: Never Smoker   . Smokeless tobacco: Never Used  . Alcohol Use: No  . Drug Use: No  . Sexual Activity: Not on file   Other Topics Concern  . Not on file   Social History Narrative    Past Surgical History  Procedure Laterality Date  . Replacement total knee bilateral    . Bladder tacked-up    . Breast surgery  left    . Bilateral cataract with intraocular lens implant      Family History  Problem Relation Age of Onset  . Cancer Mother       Medication List       This list is accurate as of: 03/05/15  9:35 AM.  Always use your most recent med list.               allopurinol 100 MG tablet  Commonly known as:  ZYLOPRIM  Take 100 mg by mouth daily. 1 tablet     aspirin 81 MG tablet  Take 81 mg by mouth daily.     atorvastatin 20 MG tablet  Commonly known as:  LIPITOR  Take 20 mg by mouth at bedtime. 1 tablet     AZOR 10-40 MG tablet  Generic drug:  amLODipine-olmesartan  Take  1 tablet by mouth daily.     cloNIDine 0.1 MG tablet  Commonly known as:  CATAPRES  Take 0.1 mg by mouth 2 (two) times daily. 1 tablet     fluticasone 50 MCG/ACT nasal spray  Commonly known as:  FLONASE  Place 1 spray into both nostrils 2 (two) times daily as needed for allergies or rhinitis.     hydrochlorothiazide 12.5 MG capsule  Commonly known as:  MICROZIDE  Take 12.5 mg by mouth daily.     LORazepam 0.5 MG tablet  Commonly known as:  ATIVAN  Take 0.5 mg by mouth every 4 (four) hours as needed.     metFORMIN 500 MG tablet  Commonly known as:  GLUCOPHAGE  Take 1,000 mg by mouth 2 (two) times daily with a meal.     METOPROLOL TARTRATE PO  Take 50 mg by mouth. 2 per day     metoprolol 50 MG tablet    Commonly known as:  LOPRESSOR  Take 50 mg by mouth 2 (two) times daily.     PRESERVISION AREDS 2 Caps  Take 2 capsules by mouth 2 (two) times daily.     TEKTURNA 300 MG tablet  Generic drug:  aliskiren  Take 300 mg by mouth daily.     venlafaxine 37.5 MG tablet  Commonly known as:  EFFEXOR  Take 37.5 mg by mouth 2 (two) times daily.     VESICARE 10 MG tablet  Generic drug:  solifenacin  Take 10 mg by mouth daily.           Objective:    BP 143/75 mmHg  Pulse 97  Temp(Src) 97 F (36.1 C) (Oral)  Ht 5' 4.75" (1.645 m)  Wt 227 lb (102.967 kg)  BMI 38.05 kg/m2  Wt Readings from Last 3 Encounters:  03/05/15 227 lb (102.967 kg)  02/01/15 230 lb (104.327 kg)  02/01/14 232 lb 4.8 oz (105.371 kg)    Physical Exam  Constitutional: She is oriented to person, place, and time. She appears well-developed and well-nourished. No distress.  HENT:  Right Ear: Tympanic membrane, external ear and ear canal normal.  Left Ear: Tympanic membrane, external ear and ear canal normal.  Nose: Mucosal edema and rhinorrhea present. No epistaxis. Right sinus exhibits no maxillary sinus tenderness and no frontal sinus tenderness. Left sinus exhibits no maxillary sinus tenderness and no frontal sinus tenderness.  Mouth/Throat: Uvula is midline and mucous membranes are normal. Posterior oropharyngeal edema and posterior oropharyngeal erythema present. No oropharyngeal exudate or tonsillar abscesses.  Eyes: Conjunctivae and EOM are normal. Right eye exhibits no discharge and no exudate. Left eye exhibits no discharge and no exudate. Right conjunctiva is not injected. Right conjunctiva has no hemorrhage. Left conjunctiva is not injected. Left conjunctiva has no hemorrhage.  Some swelling and mild erythema is noted in the upper and lower eyelid on the left eye.  Neck: Neck supple. No thyromegaly present.  Cardiovascular: Normal rate, regular rhythm, normal heart sounds and intact distal pulses.   No  murmur heard. Pulmonary/Chest: Effort normal and breath sounds normal. No respiratory distress. She has no wheezes.  Musculoskeletal: Normal range of motion. She exhibits no edema or tenderness.  Lymphadenopathy:    She has no cervical adenopathy.  Neurological: She is alert and oriented to person, place, and time. Coordination normal.  Skin: Skin is warm and dry. No rash noted. She is not diaphoretic.  Psychiatric: She has a normal mood and affect. Her behavior is normal.  Vitals  reviewed.  Results for orders placed or performed in visit on 02/01/15  Ferritin  Result Value Ref Range   Ferritin 39 11 - 307 ng/mL      Assessment & Plan:   Problem List Items Addressed This Visit    None    Visit Diagnoses    Acute rhinosinusitis    -  Primary    Relevant Medications    fluticasone (FLONASE) 50 MCG/ACT nasal spray        Follow up plan: Return in about 2 weeks (around 03/19/2015), or if symptoms worsen or fail to improve, for dm, htn, hld.  Caryl Pina, MD [facility] 03/05/2015, 9:35 AM

## 2015-03-13 ENCOUNTER — Other Ambulatory Visit: Payer: Self-pay

## 2015-03-20 ENCOUNTER — Encounter: Payer: Self-pay | Admitting: Family Medicine

## 2015-03-20 ENCOUNTER — Ambulatory Visit (INDEPENDENT_AMBULATORY_CARE_PROVIDER_SITE_OTHER): Payer: Medicare Other | Admitting: Family Medicine

## 2015-03-20 VITALS — Ht 64.75 in | Wt 228.4 lb

## 2015-03-20 DIAGNOSIS — F418 Other specified anxiety disorders: Secondary | ICD-10-CM

## 2015-03-20 DIAGNOSIS — J019 Acute sinusitis, unspecified: Secondary | ICD-10-CM | POA: Diagnosis not present

## 2015-03-20 DIAGNOSIS — E1139 Type 2 diabetes mellitus with other diabetic ophthalmic complication: Secondary | ICD-10-CM

## 2015-03-20 DIAGNOSIS — N3281 Overactive bladder: Secondary | ICD-10-CM | POA: Diagnosis not present

## 2015-03-20 DIAGNOSIS — E785 Hyperlipidemia, unspecified: Secondary | ICD-10-CM

## 2015-03-20 DIAGNOSIS — F419 Anxiety disorder, unspecified: Secondary | ICD-10-CM

## 2015-03-20 DIAGNOSIS — F329 Major depressive disorder, single episode, unspecified: Secondary | ICD-10-CM

## 2015-03-20 DIAGNOSIS — I10 Essential (primary) hypertension: Secondary | ICD-10-CM

## 2015-03-20 DIAGNOSIS — F339 Major depressive disorder, recurrent, unspecified: Secondary | ICD-10-CM | POA: Insufficient documentation

## 2015-03-20 LAB — BAYER DCA HB A1C WAIVED: HB A1C (BAYER DCA - WAIVED): 6.7 % (ref <7.0)

## 2015-03-20 MED ORDER — AMLODIPINE-OLMESARTAN 10-40 MG PO TABS: 1.0000 | ORAL_TABLET | Freq: Every day | ORAL | Status: DC

## 2015-03-20 MED ORDER — FLUTICASONE PROPIONATE 50 MCG/ACT NA SUSP: 1.0000 | Freq: Two times a day (BID) | NASAL | Status: DC | PRN

## 2015-03-20 MED ORDER — MIRABEGRON ER 50 MG PO TB24: 50.0000 mg | ORAL_TABLET | Freq: Every day | ORAL | Status: DC

## 2015-03-20 MED ORDER — VENLAFAXINE HCL 37.5 MG PO TABS: 37.5000 mg | ORAL_TABLET | Freq: Two times a day (BID) | ORAL | Status: DC

## 2015-03-20 MED ORDER — ALISKIREN FUMARATE 300 MG PO TABS: 300.0000 mg | ORAL_TABLET | Freq: Every day | ORAL | Status: DC

## 2015-03-20 MED ORDER — LORAZEPAM 0.5 MG PO TABS: 0.5000 mg | ORAL_TABLET | ORAL | Status: DC | PRN

## 2015-03-20 MED ORDER — CLONIDINE HCL 0.1 MG PO TABS: 0.1000 mg | ORAL_TABLET | Freq: Two times a day (BID) | ORAL | Status: DC

## 2015-03-20 MED ORDER — ATORVASTATIN CALCIUM 20 MG PO TABS: 20.0000 mg | ORAL_TABLET | Freq: Every day | ORAL | Status: DC

## 2015-03-20 MED ORDER — METOPROLOL TARTRATE 50 MG PO TABS: 50.0000 mg | ORAL_TABLET | Freq: Two times a day (BID) | ORAL | Status: DC

## 2015-03-20 MED ORDER — ALLOPURINOL 100 MG PO TABS: 100.0000 mg | ORAL_TABLET | Freq: Every day | ORAL | Status: DC

## 2015-03-20 MED ORDER — METFORMIN HCL 500 MG PO TABS: 1000.0000 mg | ORAL_TABLET | Freq: Two times a day (BID) | ORAL | Status: DC

## 2015-03-20 MED ORDER — HYDROCHLOROTHIAZIDE 12.5 MG PO CAPS: 12.5000 mg | ORAL_CAPSULE | Freq: Every day | ORAL | Status: DC

## 2015-03-20 NOTE — Progress Notes (Signed)
Ht 5' 4.75" (1.645 m)  Wt 228 lb 6.4 oz (103.602 kg)  BMI 38.29 kg/m2   Subjective:    Patient ID: Anna Dickerson, female    DOB: 04-20-1929, 82 y.o.   MRN: [id number]  HPI: [name] is a 82 y.o. female presenting on 03/20/2015 for Hyperlipidemia; Hypertension; and Diabetes   HPI Hypertension recheck Patient is coming in today for a hypertension recheck. Her blood pressure today is 135/70. She is currently on Tekturna and Azor and clonidine and hydrochlorothiazide and metoprolol for her blood pressure. Patient denies headaches, blurred vision, chest pains, shortness of breath, or weakness. Denies any side effects from medication and is content with current medication.   Type 2 diabetes recheck Patient's hemoglobin A1c came back at 6.7 today.she is currently on metformin 1000 mg twice a day. She denies any issues with the medication. She does not regularly check her blood sugars at home.  Anxiety and depression Patient currently takes Effexor and Ativan for her anxiety and depression. She feels like they're doing very well stabilizing her and denies any issues with them currently. She denies any suicidal ideations or thoughts of hurting herself.  Hyperlipidemia recheck Patient is coming in today is tach about her medication for her cholesterol . Really she is on Lipitor 20 mg. She thinks she may have some more joint issues while on the medication that she had previously but is hard to tell and she is willing to continue it at this point. Discussed the possibility of adding a coQ 10 and she will consider it.  Overactive bladder Patient currently still is having leakage and does not feel like the Vesicare helped her much. She tried something else previously but cannot remember what it is and cannot remember if it helped her more. She is not having side effects of the Vesicare such as dry mouth but is just not helping her. She denies any hematuria or  Dysuria or abdominal  pain.  Cough cold and congestion Patient has been having cough and congestion intermittently for the past few days. She denies any fevers or chills or shortness of breath. The cough is mostly nonproductive at this point. She is having some postnasal drainage. She has not really used anything for it yet.  Relevant past medical, surgical, family and social history reviewed and updated as indicated. Interim medical history since our last visit reviewed. Allergies and medications reviewed and updated.  Review of Systems  Constitutional: Negative for fever and chills.  HENT: Positive for congestion, postnasal drip, rhinorrhea and sore throat. Negative for ear discharge, ear pain, sinus pressure and sneezing.   Eyes: Negative for pain, redness and visual disturbance.  Respiratory: Positive for cough. Negative for chest tightness and shortness of breath.   Cardiovascular: Negative for chest pain and leg swelling.  Genitourinary: Positive for frequency (with some urge incontinence and some overflow). Negative for dysuria and difficulty urinating.  Musculoskeletal: Negative for back pain and gait problem.  Skin: Negative for rash.  Neurological: Negative for light-headedness and headaches.  Psychiatric/Behavioral: Negative for suicidal ideas, behavioral problems, sleep disturbance, self-injury and agitation. The patient is nervous/anxious.   All other systems reviewed and are negative.   Per HPI unless specifically indicated above     Medication List       This list is accurate as of: 03/20/15 10:42 AM.  Always use your most recent med list.               aliskiren 300  MG tablet  Commonly known as:  TEKTURNA  Take 1 tablet (300 mg total) by mouth daily.     allopurinol 100 MG tablet  Commonly known as:  ZYLOPRIM  Take 1 tablet (100 mg total) by mouth daily. 1 tablet     amLODipine-olmesartan 10-40 MG tablet  Commonly known as:  AZOR  Take 1 tablet by mouth daily.     aspirin 81 MG  tablet  Take 81 mg by mouth daily.     atorvastatin 20 MG tablet  Commonly known as:  LIPITOR  Take 1 tablet (20 mg total) by mouth at bedtime. 1 tablet     cloNIDine 0.1 MG tablet  Commonly known as:  CATAPRES  Take 1 tablet (0.1 mg total) by mouth 2 (two) times daily. 1 tablet     fluticasone 50 MCG/ACT nasal spray  Commonly known as:  FLONASE  Place 1 spray into both nostrils 2 (two) times daily as needed for allergies or rhinitis.     hydrochlorothiazide 12.5 MG capsule  Commonly known as:  MICROZIDE  Take 1 capsule (12.5 mg total) by mouth daily.     LORazepam 0.5 MG tablet  Commonly known as:  ATIVAN  Take 1 tablet (0.5 mg total) by mouth every 4 (four) hours as needed.     metFORMIN 500 MG tablet  Commonly known as:  GLUCOPHAGE  Take 2 tablets (1,000 mg total) by mouth 2 (two) times daily with a meal.     metoprolol 50 MG tablet  Commonly known as:  LOPRESSOR  Take 1 tablet (50 mg total) by mouth 2 (two) times daily.     mirabegron ER 50 MG Tb24 tablet  Commonly known as:  MYRBETRIQ  Take 1 tablet (50 mg total) by mouth daily.     PRESERVISION AREDS 2 Caps  Take 2 capsules by mouth 2 (two) times daily.     venlafaxine 37.5 MG tablet  Commonly known as:  EFFEXOR  Take 1 tablet (37.5 mg total) by mouth 2 (two) times daily.     VESICARE 10 MG tablet  Generic drug:  solifenacin  Take 10 mg by mouth daily.           Objective:    Ht 5' 4.75" (1.645 m)  Wt 228 lb 6.4 oz (103.602 kg)  BMI 38.29 kg/m2  Wt Readings from Last 3 Encounters:  03/20/15 228 lb 6.4 oz (103.602 kg)  03/05/15 227 lb (102.967 kg)  02/01/15 230 lb (104.327 kg)    Physical Exam  Constitutional: She is oriented to person, place, and time. She appears well-developed and well-nourished. No distress.  HENT:  Right Ear: Tympanic membrane, external ear and ear canal normal.  Left Ear: Tympanic membrane, external ear and ear canal normal.  Nose: Mucosal edema and rhinorrhea present. No  epistaxis. Right sinus exhibits no maxillary sinus tenderness and no frontal sinus tenderness. Left sinus exhibits no maxillary sinus tenderness and no frontal sinus tenderness.  Mouth/Throat: Uvula is midline and mucous membranes are normal. Posterior oropharyngeal edema and posterior oropharyngeal erythema present. No oropharyngeal exudate or tonsillar abscesses.  Eyes: Conjunctivae and EOM are normal.  Cardiovascular: Normal rate, regular rhythm, normal heart sounds and intact distal pulses.   No murmur heard. Pulmonary/Chest: Effort normal and breath sounds normal. No respiratory distress. She has no wheezes.  Abdominal: Soft. Bowel sounds are normal. She exhibits no distension. There is no tenderness. There is no rebound.  Musculoskeletal: Normal range of motion. She exhibits no edema  or tenderness.  Neurological: She is alert and oriented to person, place, and time. Coordination normal.  Skin: Skin is warm and dry. No rash noted. She is not diaphoretic.  Psychiatric: She has a normal mood and affect. Her behavior is normal. Judgment and thought content normal.  Vitals reviewed.     Assessment & Plan:   Problem List Items Addressed This Visit      Cardiovascular and Mediastinum   HTN (hypertension)   Relevant Medications   amLODipine-olmesartan (AZOR) 10-40 MG tablet   atorvastatin (LIPITOR) 20 MG tablet   cloNIDine (CATAPRES) 0.1 MG tablet   hydrochlorothiazide (MICROZIDE) 12.5 MG capsule   metoprolol (LOPRESSOR) 50 MG tablet   aliskiren (TEKTURNA) 300 MG tablet     Endocrine   DM (diabetes mellitus) (HCC)   Relevant Medications   amLODipine-olmesartan (AZOR) 10-40 MG tablet   atorvastatin (LIPITOR) 20 MG tablet   metFORMIN (GLUCOPHAGE) 500 MG tablet   Other Relevant Orders   Bayer DCA Hb A1c Waived (Completed)     Genitourinary   OAB (overactive bladder)   Relevant Medications   mirabegron ER (MYRBETRIQ) 50 MG TB24 tablet     Other   Anxiety and depression    Relevant Medications   venlafaxine (EFFEXOR) 37.5 MG tablet   LORazepam (ATIVAN) 0.5 MG tablet    Other Visit Diagnoses    Acute rhinosinusitis    -  Primary    Relevant Medications    fluticasone (FLONASE) 50 MCG/ACT nasal spray    Hyperlipidemia LDL goal <100        Relevant Medications    amLODipine-olmesartan (AZOR) 10-40 MG tablet    atorvastatin (LIPITOR) 20 MG tablet    cloNIDine (CATAPRES) 0.1 MG tablet    hydrochlorothiazide (MICROZIDE) 12.5 MG capsule    metoprolol (LOPRESSOR) 50 MG tablet    aliskiren (TEKTURNA) 300 MG tablet        Follow up plan: Return in about 4 weeks (around 04/17/2015), or if symptoms worsen or fail to improve, for htn, dm2.  Counseling provided for all of the vaccine components Orders Placed This Encounter  Procedures  . [facility] Hb A1c [place], MD [facility] 03/20/2015, 10:42 AM

## 2015-03-22 ENCOUNTER — Encounter (HOSPITAL_COMMUNITY): Payer: Medicare Other | Attending: Hematology & Oncology

## 2015-03-22 VITALS — BP 174/83 | HR 104 | Temp 97.7°F | Resp 20

## 2015-03-22 DIAGNOSIS — C50912 Malignant neoplasm of unspecified site of left female breast: Secondary | ICD-10-CM | POA: Insufficient documentation

## 2015-03-22 DIAGNOSIS — E538 Deficiency of other specified B group vitamins: Secondary | ICD-10-CM

## 2015-03-22 MED ORDER — CYANOCOBALAMIN 1000 MCG/ML IJ SOLN
INTRAMUSCULAR | Status: AC
  Filled 2015-03-22: qty 1

## 2015-03-22 MED ORDER — CYANOCOBALAMIN 1000 MCG/ML IJ SOLN
1000.0000 ug | Freq: Once | INTRAMUSCULAR | Status: AC
  Administered 2015-03-22: 1000 ug via INTRAMUSCULAR

## 2015-03-22 NOTE — Patient Instructions (Addendum)
[facility] Discharge Instructions  RECOMMENDATIONS MADE BY THE CONSULTANT AND ANY TEST RESULTS WILL BE SENT TO YOUR REFERRING PHYSICIAN.  Vitamin B12 1000 mcg injection given as ordered.  Thank you for choosing [facility] at [facility] to provide your oncology and hematology care.  To afford each patient quality time with our provider, please arrive at least 15 minutes before your scheduled appointment time.   Beginning January 23rd 2017 lab work for the Cancer Center will be done in the  Main lab at [place] on 1st floor. If you have a lab appointment with the Cancer Center please come in thru the  Main Entrance and check in at the main information desk  You need to re-schedule your appointment should you arrive 10 or more minutes late.  We strive to give you quality time with our providers, and arriving late affects you and other patients whose appointments are after yours.  Also, if you no show three or more times for appointments you may be dismissed from the clinic at the providers discretion.     Again, thank you for choosing [facility].  Our hope is that these requests will decrease the amount of time that you wait before being seen by our physicians.       _____________________________________________________________  Should you have questions after your visit to  Cancer Center, please contact our office at [phone] between the hours of 8:30 a.m. and 4:30 p.m.  Voicemails left after 4:30 p.m. will not be returned until the following business day.  For prescription refill requests, have your pharmacy contact our office.         Resources For Cancer Patients and their Caregivers ? American [employer] Society: Can assist with transportation, wigs, general needs, runs Look Good Feel Better.        1-888-227-6333 ? Cancer Care: Provides financial assistance, online support groups,  medication/co-pay assistance.  1-800-813-HOPE (4673) ? [facility] Assists [place] cancer patients and their families through emotional , educational and financial support.  336-427-4357 ? [place] DSS Where to apply for food stamps, Medicaid and utility assistance. 336-342-1394 ? [facility]: Transportation to medical appointments. 336-347-2287 ? Social Security Administration: May apply for disability if have a Stage IV cancer. 336-342-7796 1-800-772-1213 ? [place] [facility]ging, Disability and Transit Services: Assists with nutrition, care and transit needs. 336-349-2343 

## 2015-03-22 NOTE — Progress Notes (Signed)
Anna Dickerson presents today for injection per MD orders. B12 1000 mcg administered IM in left Upper Arm. Administration without incident. Patient tolerated well.

## 2015-03-26 ENCOUNTER — Other Ambulatory Visit: Payer: Self-pay | Admitting: *Deleted

## 2015-03-26 DIAGNOSIS — E538 Deficiency of other specified B group vitamins: Secondary | ICD-10-CM

## 2015-03-26 MED ORDER — CYANOCOBALAMIN 1000 MCG/ML IJ SOLN
1000.0000 ug | INTRAMUSCULAR | Status: DC
  Administered 2015-04-23 – 2016-11-03 (×16): 1000 ug via INTRAMUSCULAR

## 2015-04-17 ENCOUNTER — Ambulatory Visit (INDEPENDENT_AMBULATORY_CARE_PROVIDER_SITE_OTHER): Payer: Medicare Other | Admitting: Family Medicine

## 2015-04-17 ENCOUNTER — Encounter: Payer: Self-pay | Admitting: Family Medicine

## 2015-04-17 VITALS — BP 119/77 | HR 97 | Temp 97.3°F | Ht 64.75 in | Wt 228.4 lb

## 2015-04-17 DIAGNOSIS — J019 Acute sinusitis, unspecified: Secondary | ICD-10-CM | POA: Diagnosis not present

## 2015-04-17 DIAGNOSIS — N179 Acute kidney failure, unspecified: Secondary | ICD-10-CM

## 2015-04-17 DIAGNOSIS — E1139 Type 2 diabetes mellitus with other diabetic ophthalmic complication: Secondary | ICD-10-CM | POA: Diagnosis not present

## 2015-04-17 DIAGNOSIS — I10 Essential (primary) hypertension: Secondary | ICD-10-CM | POA: Diagnosis not present

## 2015-04-17 MED ORDER — AZITHROMYCIN 250 MG PO TABS: ORAL_TABLET | ORAL | Status: DC

## 2015-04-17 NOTE — Progress Notes (Signed)
BP 119/77 mmHg  Pulse 97  Temp(Src) 97.3 F (36.3 C) (Oral)  Ht 5' 4.75" (1.645 m)  Wt 228 lb 6.4 oz (103.602 kg)  BMI 38.29 kg/m2   Subjective:    Patient ID: Anna Dickerson, female    DOB: Oct 18, 1929, 82 y.o.   MRN: [id number]  HPI: Anna Dickerson is a 82 y.o. female presenting on 04/17/2015 for Hypertension and Labwork   HPI Hypertension recheck Patient is coming in today for a hypertension recheck. Her blood pressure today is 119/77. Patient denies headaches, blurred vision, chest pains, shortness of breath, or weakness. Denies any side effects from medication and is content with current medication.   Type 2 diabetes Patient was recently diagnosed with type 2 diabetes. Her hemoglobin A1c in March 7 was 6.7.  She denies any issues with her feet. She has yet to have her first diabetic eye exam. She was started on metformin 500 twice a day which we're monitoring closely because she does have some known acute versus chronic kidney damage. Her creatinine was 1.3 to with a GFR in the high 30s.  Sinus congestion and cough Patient has been having sinus congestion and cough that is been going on for the past few days. Her cough is nonproductive. Her congestion is draining down the back of her throat into her chest. She denies any shortness of breath or wheezing. She denies any fevers or chills. She is just concerned because when she gets these usually turn into bronchitis or pneumonia.  Relevant past medical, surgical, family and social history reviewed and updated as indicated. Interim medical history since our last visit reviewed. Allergies and medications reviewed and updated.  Review of Systems  Constitutional: Negative for fever and chills.  HENT: Positive for congestion, postnasal drip, rhinorrhea, sinus pressure, sneezing and sore throat. Negative for ear discharge and ear pain.   Eyes: Negative for pain, redness and visual disturbance.  Respiratory: Positive for cough.  Negative for chest tightness and shortness of breath.   Cardiovascular: Negative for chest pain and leg swelling.  Genitourinary: Negative for dysuria and difficulty urinating.  Musculoskeletal: Negative for back pain and gait problem.  Skin: Negative for rash.  Neurological: Negative for light-headedness and headaches.  Psychiatric/Behavioral: Negative for behavioral problems and agitation.  All other systems reviewed and are negative.   Per HPI unless specifically indicated above     Medication List       This list is accurate as of: 04/17/15 10:46 AM.  Always use your most recent med list.               aliskiren 300 MG tablet  Commonly known as:  TEKTURNA  Take 1 tablet (300 mg total) by mouth daily.     allopurinol 100 MG tablet  Commonly known as:  ZYLOPRIM  Take 1 tablet (100 mg total) by mouth daily. 1 tablet     amLODipine-olmesartan 10-40 MG tablet  Commonly known as:  AZOR  Take 1 tablet by mouth daily.     aspirin 81 MG tablet  Take 81 mg by mouth daily.     atorvastatin 20 MG tablet  Commonly known as:  LIPITOR  Take 1 tablet (20 mg total) by mouth at bedtime. 1 tablet     cloNIDine 0.1 MG tablet  Commonly known as:  CATAPRES  Take 1 tablet (0.1 mg total) by mouth 2 (two) times daily. 1 tablet     fluticasone 50 MCG/ACT nasal spray  Commonly known  as:  FLONASE  Place 1 spray into both nostrils 2 (two) times daily as needed for allergies or rhinitis.     hydrochlorothiazide 12.5 MG capsule  Commonly known as:  MICROZIDE  Take 1 capsule (12.5 mg total) by mouth daily.     LORazepam 0.5 MG tablet  Commonly known as:  ATIVAN  Take 1 tablet (0.5 mg total) by mouth every 4 (four) hours as needed.     metFORMIN 500 MG tablet  Commonly known as:  GLUCOPHAGE  Take 2 tablets (1,000 mg total) by mouth 2 (two) times daily with a meal.     metoprolol 50 MG tablet  Commonly known as:  LOPRESSOR  Take 1 tablet (50 mg total) by mouth 2 (two) times daily.       mirabegron ER 50 MG Tb24 tablet  Commonly known as:  MYRBETRIQ  Take 1 tablet (50 mg total) by mouth daily.     PRESERVISION AREDS 2 Caps  Take 2 capsules by mouth 2 (two) times daily.     venlafaxine 37.5 MG tablet  Commonly known as:  EFFEXOR  Take 1 tablet (37.5 mg total) by mouth 2 (two) times daily.     VESICARE 10 MG tablet  Generic drug:  solifenacin  Take 10 mg by mouth daily.           Objective:    BP 119/77 mmHg  Pulse 97  Temp(Src) 97.3 F (36.3 C) (Oral)  Ht 5' 4.75" (1.645 m)  Wt 228 lb 6.4 oz (103.602 kg)  BMI 38.29 kg/m2  Wt Readings from Last 3 Encounters:  04/17/15 228 lb 6.4 oz (103.602 kg)  03/20/15 228 lb 6.4 oz (103.602 kg)  03/05/15 227 lb (102.967 kg)    Physical Exam  Constitutional: She is oriented to person, place, and time. She appears well-developed and well-nourished. No distress.  HENT:  Right Ear: Tympanic membrane, external ear and ear canal normal.  Left Ear: Tympanic membrane, external ear and ear canal normal.  Nose: Mucosal edema and rhinorrhea present. No epistaxis. Right sinus exhibits no maxillary sinus tenderness and no frontal sinus tenderness. Left sinus exhibits no maxillary sinus tenderness and no frontal sinus tenderness.  Mouth/Throat: Uvula is midline and mucous membranes are normal. Posterior oropharyngeal edema and posterior oropharyngeal erythema present. No oropharyngeal exudate or tonsillar abscesses.  Eyes: Conjunctivae and EOM are normal.  Neck: Neck supple. No thyromegaly present.  Cardiovascular: Normal rate, regular rhythm, normal heart sounds and intact distal pulses.   No murmur heard. Pulmonary/Chest: Effort normal and breath sounds normal. No respiratory distress. She has no wheezes.  Musculoskeletal: Normal range of motion. She exhibits no edema or tenderness.  Lymphadenopathy:    She has no cervical adenopathy.  Neurological: She is alert and oriented to person, place, and time. Coordination normal.   Skin: Skin is warm and dry. No rash noted. She is not diaphoretic.  Psychiatric: She has a normal mood and affect. Her behavior is normal.  Vitals reviewed.   Results for orders placed or performed in visit on 03/20/15  Bayer DCA Hb A1c Waived  Result Value Ref Range   Bayer DCA Hb A1c Waived 6.7 <7.0 %      Assessment & Plan:   Problem List Items Addressed This Visit      Cardiovascular and Mediastinum   HTN (hypertension)   Relevant Orders   CMP14+EGFR (Completed)   Lipid panel (Completed)     Endocrine   DM (diabetes mellitus) ([place])  Relevant Orders   CMP14+EGFR (Completed)   Lipid panel (Completed)   Ambulatory referral to Ophthalmology   Microalbumin / creatinine urine ratio (Completed)    Other Visit Diagnoses    Acute kidney injury ([place])    -  Primary    Relevant Orders    CMP14+EGFR (Completed)    Acute rhinosinusitis        Relevant Medications    azithromycin (ZITHROMAX) 250 MG tablet       Follow up plan: Return in about 3 months (around 07/17/2015), or if symptoms worsen or fail to improve, for Diabetes recheck.  Counseling provided for all of the vaccine components Orders Placed This Encounter  Procedures  . CMP14+EGFR  . Lipid panel  . Microalbumin / creatinine urine ratio  . Ambulatory referral to Ophthalmology   Caryl Pina, MD [facility] 04/17/2015, 10:46 AM

## 2015-04-18 LAB — LIPID PANEL
Chol/HDL Ratio: 3.2 ratio units (ref 0.0–4.4)
Cholesterol, Total: 117 mg/dL (ref 100–199)
HDL: 37 mg/dL — AB (ref >39)
LDL Calculated: 28 mg/dL (ref 0–99)
TRIGLYCERIDES: 260 mg/dL — AB (ref 0–149)
VLDL Cholesterol Cal: 52 mg/dL — ABNORMAL HIGH (ref 5–40)

## 2015-04-18 LAB — CMP14+EGFR
A/G RATIO: 1.4 (ref 1.2–2.2)
ALK PHOS: 78 IU/L (ref 39–117)
ALT: 23 IU/L (ref 0–32)
AST: 27 IU/L (ref 0–40)
Albumin: 4.4 g/dL (ref 3.5–4.7)
BILIRUBIN TOTAL: 0.3 mg/dL (ref 0.0–1.2)
BUN/Creatinine Ratio: 21 (ref 12–28)
BUN: 28 mg/dL — ABNORMAL HIGH (ref 8–27)
CHLORIDE: 100 mmol/L (ref 96–106)
CO2: 20 mmol/L (ref 18–29)
Calcium: 9.6 mg/dL (ref 8.7–10.3)
Creatinine, Ser: 1.32 mg/dL — ABNORMAL HIGH (ref 0.57–1.00)
GFR calc Af Amer: 43 mL/min/{1.73_m2} — ABNORMAL LOW (ref >59)
GFR calc non Af Amer: 37 mL/min/{1.73_m2} — ABNORMAL LOW (ref >59)
Globulin, Total: 3.1 g/dL (ref 1.5–4.5)
Glucose: 144 mg/dL — ABNORMAL HIGH (ref 65–99)
POTASSIUM: 4.8 mmol/L (ref 3.5–5.2)
Sodium: 140 mmol/L (ref 134–144)
Total Protein: 7.5 g/dL (ref 6.0–8.5)

## 2015-04-18 LAB — MICROALBUMIN / CREATININE URINE RATIO
CREATININE, UR: 67.5 mg/dL
MICROALB/CREAT RATIO: 146.5 mg/g creat — ABNORMAL HIGH (ref 0.0–30.0)
MICROALBUM., U, RANDOM: 98.9 ug/mL

## 2015-04-18 NOTE — Progress Notes (Signed)
Patient aware. Will get fish oil

## 2015-04-19 ENCOUNTER — Ambulatory Visit (HOSPITAL_COMMUNITY): Payer: Medicare PPO

## 2015-04-23 ENCOUNTER — Ambulatory Visit (INDEPENDENT_AMBULATORY_CARE_PROVIDER_SITE_OTHER): Payer: Medicare Other | Admitting: *Deleted

## 2015-04-23 DIAGNOSIS — E538 Deficiency of other specified B group vitamins: Secondary | ICD-10-CM

## 2015-04-23 NOTE — Patient Instructions (Signed)
Patient tolerated B-12 injection well.

## 2015-04-26 ENCOUNTER — Other Ambulatory Visit: Payer: Self-pay | Admitting: Family Medicine

## 2015-04-30 NOTE — Telephone Encounter (Signed)
rx called into pharmacy

## 2015-04-30 NOTE — Telephone Encounter (Signed)
Last seen 04/17/15  Dr Dettinger  If approved route to nurse to call into [facility]

## 2015-04-30 NOTE — Telephone Encounter (Signed)
Go ahead and print for refill

## 2015-05-15 ENCOUNTER — Other Ambulatory Visit: Payer: Self-pay | Admitting: *Deleted

## 2015-05-15 DIAGNOSIS — J019 Acute sinusitis, unspecified: Secondary | ICD-10-CM

## 2015-05-15 MED ORDER — FLUTICASONE PROPIONATE 50 MCG/ACT NA SUSP: 1.0000 | Freq: Two times a day (BID) | NASAL | Status: DC | PRN

## 2015-05-23 ENCOUNTER — Encounter (INDEPENDENT_AMBULATORY_CARE_PROVIDER_SITE_OTHER): Payer: Self-pay

## 2015-05-23 ENCOUNTER — Ambulatory Visit (INDEPENDENT_AMBULATORY_CARE_PROVIDER_SITE_OTHER): Payer: Medicare Other

## 2015-05-23 DIAGNOSIS — E538 Deficiency of other specified B group vitamins: Secondary | ICD-10-CM | POA: Diagnosis not present

## 2015-05-31 ENCOUNTER — Other Ambulatory Visit: Payer: Self-pay | Admitting: Family Medicine

## 2015-06-01 ENCOUNTER — Other Ambulatory Visit: Payer: Self-pay | Admitting: Family Medicine

## 2015-06-01 ENCOUNTER — Telehealth: Payer: Self-pay | Admitting: Family Medicine

## 2015-06-01 NOTE — Telephone Encounter (Signed)
Refill called to [facility] 

## 2015-06-01 NOTE — Telephone Encounter (Signed)
I just did this earlier today,

## 2015-06-01 NOTE — Telephone Encounter (Signed)
Last seen 04/17/15 Dr Dettinger  If approved route to nurse to call into [facility]

## 2015-06-01 NOTE — Telephone Encounter (Signed)
Last seen 04/17/15  Dr Dettinger  If approved route to nurse to call into [facility]

## 2015-06-01 NOTE — Telephone Encounter (Signed)
Will go ahead and print

## 2015-06-01 NOTE — Telephone Encounter (Signed)
Pending with Dr Warrick Parisian

## 2015-06-04 NOTE — Telephone Encounter (Signed)
Dr. Warrick Parisian approved, printed and signed the prescription.  Patient contacted and informed that prescription is at front desk ready for pick up.

## 2015-06-25 ENCOUNTER — Encounter: Payer: Self-pay | Admitting: Family Medicine

## 2015-06-25 ENCOUNTER — Ambulatory Visit (INDEPENDENT_AMBULATORY_CARE_PROVIDER_SITE_OTHER): Payer: Medicare Other | Admitting: Family Medicine

## 2015-06-25 ENCOUNTER — Ambulatory Visit: Payer: Medicare Other

## 2015-06-25 VITALS — BP 133/81 | HR 93 | Temp 97.4°F | Ht 64.75 in | Wt 227.6 lb

## 2015-06-25 DIAGNOSIS — S3091XA Unspecified superficial injury of lower back and pelvis, initial encounter: Secondary | ICD-10-CM | POA: Diagnosis not present

## 2015-06-25 DIAGNOSIS — H6122 Impacted cerumen, left ear: Secondary | ICD-10-CM | POA: Diagnosis not present

## 2015-06-25 DIAGNOSIS — T148XXA Other injury of unspecified body region, initial encounter: Secondary | ICD-10-CM

## 2015-06-25 DIAGNOSIS — L209 Atopic dermatitis, unspecified: Secondary | ICD-10-CM

## 2015-06-25 DIAGNOSIS — E538 Deficiency of other specified B group vitamins: Secondary | ICD-10-CM | POA: Diagnosis not present

## 2015-06-25 MED ORDER — TRIAMCINOLONE ACETONIDE 0.1 % EX CREA: 1.0000 "application " | TOPICAL_CREAM | Freq: Two times a day (BID) | CUTANEOUS | Status: DC

## 2015-06-25 NOTE — Progress Notes (Signed)
BP 133/81 mmHg  Pulse 93  Temp(Src) 97.4 F (36.3 C) (Oral)  Ht 5' 4.75" (1.645 m)  Wt 227 lb 9.6 oz (103.239 kg)  BMI 38.15 kg/m2   Subjective:    Patient ID: Anna Dickerson, female    DOB: 1929/05/03, 82 y.o.   MRN: [id number]  HPI: Anna Dickerson is a 82 y.o. female presenting on 06/25/2015 for Pain in tailbone   HPI Pain in back of left buttock Over the past 2 days patient has started to do a workout regimen that inhaled leg lifts and since that time she has started to have muscle pain and soreness in the back of her left lower buttock where it meets her leg. She has no pain radiating anywhere else. She denies any numbness or weakness anywhere. There are no overlying skin changes or warmth or redness.  Atopic dermatitis Patient has a small spot or rash on her right posterior lateral calf the was initially a lot larger than it is currently. She has been using topical steroid and topical antibiotic on it and it was reduced in size and she is wondering if she could get a prescription of something for it. The spot is pink and raised and scaly and very pruritic to her. There is no drainage coming out of the spot and no warmth or redness.  Ear wax left ear Patient has been having popping and feeling of water in her left ear. She has been using drops and things at home to try and clear it out but she still has a little bit of it in there. She would like to have a washout less today.  Relevant past medical, surgical, family and social history reviewed and updated as indicated. Interim medical history since our last visit reviewed. Allergies and medications reviewed and updated.  Review of Systems  Constitutional: Negative for fever and chills.  HENT: Positive for ear discharge. Negative for congestion and ear pain.   Eyes: Negative for redness and visual disturbance.  Respiratory: Negative for chest tightness and shortness of breath.   Cardiovascular: Negative for chest pain  and leg swelling.  Genitourinary: Negative for dysuria and difficulty urinating.  Musculoskeletal: Positive for myalgias. Negative for back pain and gait problem.  Skin: Positive for color change and rash.  Neurological: Negative for dizziness, light-headedness and headaches.  Psychiatric/Behavioral: Negative for behavioral problems and agitation.  All other systems reviewed and are negative.   Per HPI unless specifically indicated above     Medication List       This list is accurate as of: 06/25/15  2:38 PM.  Always use your most recent med list.               aliskiren 300 MG tablet  Commonly known as:  TEKTURNA  Take 1 tablet (300 mg total) by mouth daily.     allopurinol 100 MG tablet  Commonly known as:  ZYLOPRIM  Take 1 tablet (100 mg total) by mouth daily. 1 tablet     amLODipine-olmesartan 10-40 MG tablet  Commonly known as:  AZOR  Take 1 tablet by mouth daily.     aspirin 81 MG tablet  Take 81 mg by mouth daily.     atorvastatin 20 MG tablet  Commonly known as:  LIPITOR  Take 1 tablet (20 mg total) by mouth at bedtime. 1 tablet     cloNIDine 0.1 MG tablet  Commonly known as:  CATAPRES  Take 1 tablet (0.1 mg total)  by mouth 2 (two) times daily. 1 tablet     fluticasone 50 MCG/ACT nasal spray  Commonly known as:  FLONASE  Place 1 spray into both nostrils 2 (two) times daily as needed for allergies or rhinitis.     hydrochlorothiazide 12.5 MG capsule  Commonly known as:  MICROZIDE  Take 1 capsule (12.5 mg total) by mouth daily.     LORazepam 0.5 MG tablet  Commonly known as:  ATIVAN  TAKE 1 TABLET EVERY 4 HOURS AS NEEDED     metFORMIN 1000 MG tablet  Commonly known as:  GLUCOPHAGE  Take 1,000 mg by mouth 2 (two) times daily with a meal.     metoprolol 50 MG tablet  Commonly known as:  LOPRESSOR  Take 1 tablet (50 mg total) by mouth 2 (two) times daily.     mirabegron ER 50 MG Tb24 tablet  Commonly known as:  MYRBETRIQ  Take 1 tablet (50 mg  total) by mouth daily.     PRESERVISION AREDS 2 Caps  Take 2 capsules by mouth 2 (two) times daily.     triamcinolone cream 0.1 %  Commonly known as:  KENALOG  Apply 1 application topically 2 (two) times daily.     venlafaxine 37.5 MG tablet  Commonly known as:  EFFEXOR  Take 1 tablet (37.5 mg total) by mouth 2 (two) times daily.     VESICARE 10 MG tablet  Generic drug:  solifenacin  Take 10 mg by mouth daily.           Objective:    BP 133/81 mmHg  Pulse 93  Temp(Src) 97.4 F (36.3 C) (Oral)  Ht 5' 4.75" (1.645 m)  Wt 227 lb 9.6 oz (103.239 kg)  BMI 38.15 kg/m2  Wt Readings from Last 3 Encounters:  06/25/15 227 lb 9.6 oz (103.239 kg)  04/17/15 228 lb 6.4 oz (103.602 kg)  03/20/15 228 lb 6.4 oz (103.602 kg)    Physical Exam  Constitutional: She is oriented to person, place, and time. She appears well-developed and well-nourished. No distress.  Eyes: Conjunctivae and EOM are normal. Pupils are equal, round, and reactive to light.  Cardiovascular: Normal rate, regular rhythm, normal heart sounds and intact distal pulses.   No murmur heard. Pulmonary/Chest: Effort normal and breath sounds normal. No respiratory distress. She has no wheezes.  Musculoskeletal: Normal range of motion. She exhibits tenderness. She exhibits no edema.       Lumbar back: She exhibits tenderness.       Back:  Neurological: She is alert and oriented to person, place, and time. Coordination normal.  Skin: Skin is warm and dry. No rash noted. She is not diaphoretic.  Psychiatric: She has a normal mood and affect. Her behavior is normal.  Nursing note and vitals reviewed.     Assessment & Plan:   Problem List Items Addressed This Visit    None    Visit Diagnoses    Cerumen impaction, left    -  Primary    Nurse to lavage her ear patient has been trying drops but failed, patient tolerated lavage well, mostly cleaned canal    Atopic dermatitis        Relevant Medications    triamcinolone  cream (KENALOG) 0.1 %    Muscle strain        Left gluteus, continue stretching and use massage techniques with tennis ball        Follow up plan: Return if symptoms worsen or fail to  improve.  Counseling provided for all of the vaccine components No orders of the defined types were placed in this encounter.    Caryl Pina, MD [facility] 06/25/2015, 2:38 PM

## 2015-07-23 ENCOUNTER — Encounter: Payer: Self-pay | Admitting: Family Medicine

## 2015-07-23 ENCOUNTER — Ambulatory Visit (INDEPENDENT_AMBULATORY_CARE_PROVIDER_SITE_OTHER): Payer: Medicare Other | Admitting: Family Medicine

## 2015-07-23 VITALS — BP 152/76 | HR 105 | Temp 96.8°F | Ht 64.75 in | Wt 225.4 lb

## 2015-07-23 DIAGNOSIS — F418 Other specified anxiety disorders: Secondary | ICD-10-CM | POA: Diagnosis not present

## 2015-07-23 DIAGNOSIS — I10 Essential (primary) hypertension: Secondary | ICD-10-CM | POA: Diagnosis not present

## 2015-07-23 DIAGNOSIS — F329 Major depressive disorder, single episode, unspecified: Secondary | ICD-10-CM

## 2015-07-23 DIAGNOSIS — F32A Depression, unspecified: Secondary | ICD-10-CM

## 2015-07-23 DIAGNOSIS — E1139 Type 2 diabetes mellitus with other diabetic ophthalmic complication: Secondary | ICD-10-CM | POA: Diagnosis not present

## 2015-07-23 DIAGNOSIS — F419 Anxiety disorder, unspecified: Secondary | ICD-10-CM

## 2015-07-23 DIAGNOSIS — L209 Atopic dermatitis, unspecified: Secondary | ICD-10-CM

## 2015-07-23 MED ORDER — TRIAMCINOLONE ACETONIDE 0.1 % EX CREA: 1.0000 "application " | TOPICAL_CREAM | Freq: Two times a day (BID) | CUTANEOUS | Status: DC

## 2015-07-23 MED ORDER — LORAZEPAM 0.5 MG PO TABS: 0.5000 mg | ORAL_TABLET | Freq: Two times a day (BID) | ORAL | Status: DC | PRN

## 2015-07-23 MED ORDER — VENLAFAXINE HCL ER 75 MG PO CP24: 75.0000 mg | ORAL_CAPSULE | Freq: Every day | ORAL | Status: DC

## 2015-07-25 ENCOUNTER — Ambulatory Visit (INDEPENDENT_AMBULATORY_CARE_PROVIDER_SITE_OTHER): Payer: Medicare Other | Admitting: *Deleted

## 2015-07-25 DIAGNOSIS — E538 Deficiency of other specified B group vitamins: Secondary | ICD-10-CM

## 2015-07-25 NOTE — Progress Notes (Signed)
Pt given B12 injection IM left deltoid and tolerated well. °

## 2015-07-25 NOTE — Progress Notes (Signed)
BP 152/76 mmHg  Pulse 105  Temp(Src) 96.8 F (36 C) (Oral)  Ht 5' 4.75" (1.645 m)  Wt 225 lb 6.4 oz (102.241 kg)  BMI 37.78 kg/m2   Subjective:    Patient ID: Anna Dickerson, female    DOB: 06/18/1929, 82 y.o.   MRN: [id number]  HPI: Anna Dickerson is a 82 y.o. female presenting on 07/23/2015 for Diabetes   HPI Hypertension recheck Patient is coming in for hypertension recheck today. Her blood pressure is 152/76. She brings in multiple home measurements which show that she is at home normally in the 130s over 80s or 130s over 70s. She even brought her meter in today to have it checked against ours and it does measure as accurate. She is currently on Azor and clonidine and hydrochlorothiazide and metoprolol. Patient denies headaches, blurred vision, chest pains, shortness of breath, or weakness. Denies any side effects from medication and is content with current medication.   Type 2 diabetes recheck Patient is coming in today for recheck on her type 2 diabetes. She is currently on metformin 1000 twice a day. She has been trying to do diet neck size. She does see an ophthalmologist every 6 months because she does have some ophthalmologic problems secondary to diabetes. She denies any new issues with her feet.  Anxiety and depression Patient has been on 37.5 mg of Effexor and feels like she is needing the lorazepam more frequently over the past couple months. She is wondering if she can get an increase in that or something to control her anxiety a little better. She has liked Effexor previously and has worked for her. She denies any suicidal ideations. She denies any issues with sleep. She has just been feeling a little more anxious more frequently and feels like her medication is not working to control it as much as it was previously.  Rash Patient does not have it currently but she gets eczema regularly and wants a refill on her triamcinolone so she can have it on hand just in  case it flares up again.  Relevant past medical, surgical, family and social history reviewed and updated as indicated. Interim medical history since our last visit reviewed. Allergies and medications reviewed and updated.  Review of Systems  Constitutional: Negative for fever and chills.  HENT: Negative for congestion, ear discharge and ear pain.   Eyes: Positive for visual disturbance. Negative for redness.  Respiratory: Negative for chest tightness and shortness of breath.   Cardiovascular: Negative for chest pain and leg swelling.  Genitourinary: Negative for dysuria and difficulty urinating.  Musculoskeletal: Negative for back pain and gait problem.  Skin: Negative for rash.  Neurological: Negative for light-headedness and headaches.  Psychiatric/Behavioral: Positive for dysphoric mood. Negative for suicidal ideas, behavioral problems, sleep disturbance, self-injury and agitation. The patient is nervous/anxious.   All other systems reviewed and are negative.   Per HPI unless specifically indicated above     Medication List       This list is accurate as of: 07/23/15 11:59 PM.  Always use your most recent med list.               aliskiren 300 MG tablet  Commonly known as:  TEKTURNA  Take 1 tablet (300 mg total) by mouth daily.     allopurinol 100 MG tablet  Commonly known as:  ZYLOPRIM  Take 1 tablet (100 mg total) by mouth daily. 1 tablet     amLODipine-olmesartan 10-40 MG  tablet  Commonly known as:  AZOR  Take 1 tablet by mouth daily.     aspirin 81 MG tablet  Take 81 mg by mouth daily.     atorvastatin 20 MG tablet  Commonly known as:  LIPITOR  Take 1 tablet (20 mg total) by mouth at bedtime. 1 tablet     cloNIDine 0.1 MG tablet  Commonly known as:  CATAPRES  Take 1 tablet (0.1 mg total) by mouth 2 (two) times daily. 1 tablet     fluticasone 50 MCG/ACT nasal spray  Commonly known as:  FLONASE  Place 1 spray into both nostrils 2 (two) times daily as  needed for allergies or rhinitis.     hydrochlorothiazide 12.5 MG capsule  Commonly known as:  MICROZIDE  Take 1 capsule (12.5 mg total) by mouth daily.     LORazepam 0.5 MG tablet  Commonly known as:  ATIVAN  Take 1 tablet (0.5 mg total) by mouth 2 (two) times daily as needed for anxiety.     metFORMIN 1000 MG tablet  Commonly known as:  GLUCOPHAGE  Take 1,000 mg by mouth 2 (two) times daily with a meal.     metoprolol 50 MG tablet  Commonly known as:  LOPRESSOR  Take 1 tablet (50 mg total) by mouth 2 (two) times daily.     mirabegron ER 50 MG Tb24 tablet  Commonly known as:  MYRBETRIQ  Take 1 tablet (50 mg total) by mouth daily.     PRESERVISION AREDS 2 Caps  Take 2 capsules by mouth 2 (two) times daily.     triamcinolone cream 0.1 %  Commonly known as:  KENALOG  Apply 1 application topically 2 (two) times daily.     venlafaxine XR 75 MG 24 hr capsule  Commonly known as:  EFFEXOR XR  Take 1 capsule (75 mg total) by mouth daily with breakfast.     VESICARE 10 MG tablet  Generic drug:  solifenacin  Take 10 mg by mouth daily.           Objective:    BP 152/76 mmHg  Pulse 105  Temp(Src) 96.8 F (36 C) (Oral)  Ht 5' 4.75" (1.645 m)  Wt 225 lb 6.4 oz (102.241 kg)  BMI 37.78 kg/m2  Wt Readings from Last 3 Encounters:  07/23/15 225 lb 6.4 oz (102.241 kg)  06/25/15 227 lb 9.6 oz (103.239 kg)  04/17/15 228 lb 6.4 oz (103.602 kg)    Physical Exam  Constitutional: She is oriented to person, place, and time. She appears well-developed and well-nourished. No distress.  Eyes: Conjunctivae and EOM are normal. Pupils are equal, round, and reactive to light.  Cardiovascular: Normal rate, regular rhythm, normal heart sounds and intact distal pulses.   No murmur heard. Pulmonary/Chest: Effort normal and breath sounds normal. No respiratory distress. She has no wheezes.  Musculoskeletal: Normal range of motion. She exhibits no edema or tenderness.  Neurological: She is  alert and oriented to person, place, and time. Coordination normal.  Skin: Skin is warm and dry. No rash noted. She is not diaphoretic.  Psychiatric: Her behavior is normal. Thought content normal. Her mood appears anxious. She exhibits a depressed mood. She expresses no suicidal ideation. She expresses no suicidal plans.  Nursing note and vitals reviewed.      Assessment & Plan:   Problem List Items Addressed This Visit      Cardiovascular and Mediastinum   HTN (hypertension)     Endocrine   DM (  diabetes mellitus) ([place]) - Primary     Other   Anxiety and depression   Relevant Medications   venlafaxine XR (EFFEXOR XR) 75 MG 24 hr capsule    Other Visit Diagnoses    Atopic dermatitis        Relevant Medications    triamcinolone cream (KENALOG) 0.1 %        Follow up plan: No Follow-up on file.  Counseling provided for all of the vaccine components No orders of the defined types were placed in this encounter.    Caryl Pina, MD [name] 07/25/2015, 9:38 AM

## 2015-08-27 ENCOUNTER — Ambulatory Visit (INDEPENDENT_AMBULATORY_CARE_PROVIDER_SITE_OTHER): Payer: Medicare Other | Admitting: Family Medicine

## 2015-08-27 ENCOUNTER — Encounter: Payer: Self-pay | Admitting: Family Medicine

## 2015-08-27 VITALS — BP 140/76 | HR 88 | Temp 98.0°F | Ht 64.75 in | Wt 223.2 lb

## 2015-08-27 DIAGNOSIS — E1139 Type 2 diabetes mellitus with other diabetic ophthalmic complication: Secondary | ICD-10-CM | POA: Diagnosis not present

## 2015-08-27 DIAGNOSIS — E538 Deficiency of other specified B group vitamins: Secondary | ICD-10-CM | POA: Diagnosis not present

## 2015-08-27 DIAGNOSIS — L209 Atopic dermatitis, unspecified: Secondary | ICD-10-CM

## 2015-08-27 DIAGNOSIS — L03115 Cellulitis of right lower limb: Secondary | ICD-10-CM

## 2015-08-27 DIAGNOSIS — I10 Essential (primary) hypertension: Secondary | ICD-10-CM

## 2015-08-27 LAB — BAYER DCA HB A1C WAIVED: HB A1C: 6.7 % (ref <7.0)

## 2015-08-27 MED ORDER — TRIAMCINOLONE ACETONIDE 0.1 % EX CREA: 1.0000 "application " | TOPICAL_CREAM | Freq: Two times a day (BID) | CUTANEOUS | 0 refills | Status: DC

## 2015-08-27 MED ORDER — CEPHALEXIN 500 MG PO CAPS: 500.0000 mg | ORAL_CAPSULE | Freq: Four times a day (QID) | ORAL | 0 refills | Status: DC

## 2015-08-27 NOTE — Progress Notes (Signed)
BP 140/76 (BP Location: Left Arm, Patient Position: Sitting, Cuff Size: Large)   Pulse 88   Temp 98 F (36.7 C) (Oral)   Ht 5' 4.75" (1.645 m)   Wt 223 lb 3.2 oz (101.2 kg)   BMI 37.43 kg/m    Subjective:    Patient ID: Anna Dickerson, female    DOB: October 15, 1929, 82 y.o.   MRN: [id number]  HPI: Anna Dickerson is a 82 y.o. female presenting on 08/27/2015 for Depression & Anxiety (followup)   HPI Hypertension check Patient has come into have a recheck on her blood pressure. Her blood pressure is 140/76. Patient denies headaches, blurred vision, chest pains, shortness of breath, or weakness. Denies any side effects from medication and is content with current medication. Patient is currently on Azor and Catapres and hydrochlorothiazide and metoprolol  Type 2 diabetes Patient comes in for a recheck of her diabetes. Her hemoglobin A1c last time was 6.7 and this time came back as 6.7 as well. Patient denies any issues with her feet.  She sees an ophthalmologist regularly. Patient is on metformin currently. Patient is on an arb and a statin  Skin infection on right leg Patient has an area on her right leg that has been healing and using topical steroids as it looked like increased scar tissue but over the past couple weeks she has developed some redness and some warmth in that area and she is concerned that it is becoming infected. She denies any fevers or chills or drainage or redness or warmth on anywhere else. The area is about the size of a dime on her right lateral lower leg.  B12 Patient is here for a recheck on her B12 and a B12 injection.  Anxiety depression Patient is coming back for anxiety and depression recheck. She is taking Effexor and lorazepam when necessary. She says she is doing very well and denies any issues with these.  Relevant past medical, surgical, family and social history reviewed and updated as indicated. Interim medical history since our last visit  reviewed. Allergies and medications reviewed and updated.  Review of Systems  Constitutional: Negative for chills and fever.  HENT: Negative for congestion, ear discharge and ear pain.   Eyes: Negative for redness and visual disturbance.  Respiratory: Negative for chest tightness and shortness of breath.   Cardiovascular: Negative for chest pain and leg swelling.  Genitourinary: Negative for difficulty urinating and dysuria.  Musculoskeletal: Negative for back pain and gait problem.  Skin: Positive for color change and rash.  Neurological: Negative for light-headedness and headaches.  Psychiatric/Behavioral: Negative for agitation and behavioral problems.  All other systems reviewed and are negative.   Per HPI unless specifically indicated above     Medication List       Accurate as of 08/27/15 11:30 AM. Always use your most recent med list.          aliskiren 300 MG tablet Commonly known as:  TEKTURNA Take 1 tablet (300 mg total) by mouth daily.   allopurinol 100 MG tablet Commonly known as:  ZYLOPRIM Take 1 tablet (100 mg total) by mouth daily. 1 tablet   amLODipine-olmesartan 10-40 MG tablet Commonly known as:  AZOR Take 1 tablet by mouth daily.   aspirin 81 MG tablet Take 81 mg by mouth daily.   atorvastatin 20 MG tablet Commonly known as:  LIPITOR Take 1 tablet (20 mg total) by mouth at bedtime. 1 tablet   cephALEXin 500 MG capsule Commonly  known as:  KEFLEX Take 1 capsule (500 mg total) by mouth 4 (four) times daily.   cloNIDine 0.1 MG tablet Commonly known as:  CATAPRES Take 1 tablet (0.1 mg total) by mouth 2 (two) times daily. 1 tablet   fluticasone 50 MCG/ACT nasal spray Commonly known as:  FLONASE Place 1 spray into both nostrils 2 (two) times daily as needed for allergies or rhinitis.   hydrochlorothiazide 12.5 MG capsule Commonly known as:  MICROZIDE Take 1 capsule (12.5 mg total) by mouth daily.   LORazepam 0.5 MG tablet Commonly known as:   ATIVAN Take 1 tablet (0.5 mg total) by mouth 2 (two) times daily as needed for anxiety.   metFORMIN 1000 MG tablet Commonly known as:  GLUCOPHAGE Take 1,000 mg by mouth 2 (two) times daily with a meal.   metoprolol 50 MG tablet Commonly known as:  LOPRESSOR Take 1 tablet (50 mg total) by mouth 2 (two) times daily.   mirabegron ER 50 MG Tb24 tablet Commonly known as:  MYRBETRIQ Take 1 tablet (50 mg total) by mouth daily.   PRESERVISION AREDS 2 Caps Take 2 capsules by mouth 2 (two) times daily.   triamcinolone cream 0.1 % Commonly known as:  KENALOG Apply 1 application topically 2 (two) times daily.   venlafaxine XR 75 MG 24 hr capsule Commonly known as:  EFFEXOR XR Take 1 capsule (75 mg total) by mouth daily with breakfast.   VESICARE 10 MG tablet Generic drug:  solifenacin Take 10 mg by mouth daily.          Objective:    BP 140/76 (BP Location: Left Arm, Patient Position: Sitting, Cuff Size: Large)   Pulse 88   Temp 98 F (36.7 C) (Oral)   Ht 5' 4.75" (1.645 m)   Wt 223 lb 3.2 oz (101.2 kg)   BMI 37.43 kg/m   Wt Readings from Last 3 Encounters:  08/27/15 223 lb 3.2 oz (101.2 kg)  07/23/15 225 lb 6.4 oz (102.2 kg)  06/25/15 227 lb 9.6 oz (103.2 kg)    Physical Exam  Constitutional: She is oriented to person, place, and time. She appears well-developed and well-nourished. No distress.  Eyes: Conjunctivae and EOM are normal. Pupils are equal, round, and reactive to light.  Cardiovascular: Normal rate, regular rhythm, normal heart sounds and intact distal pulses.   No murmur heard. Pulmonary/Chest: Effort normal and breath sounds normal. No respiratory distress. She has no wheezes.  Musculoskeletal: Normal range of motion. She exhibits no edema or tenderness.  Neurological: She is alert and oriented to person, place, and time. Coordination normal.  Skin: Skin is warm and dry. Lesion (Small dime-sized area that appears fibrous in nature but has developed a small  amount of erythema and warmth to it. No fluctuation or induration noted.) noted. No rash noted. She is not diaphoretic.  Psychiatric: She has a normal mood and affect. Her behavior is normal.  Nursing note and vitals reviewed.    Diabetic Foot Exam - Simple   Simple Foot Form Diabetic Foot exam was performed with the following findings:  Yes 08/27/2015 11:30 AM  Visual Inspection No deformities, no ulcerations, no other skin breakdown bilaterally:  Yes Sensation Testing Intact to touch and monofilament testing bilaterally:  Yes Pulse Check Posterior Tibialis and Dorsalis pulse intact bilaterally:  Yes Comments        Assessment & Plan:   Problem List Items Addressed This Visit      Cardiovascular and Mediastinum   HTN (  hypertension) - Primary     Digestive   B12 deficiency     Endocrine   DM (diabetes mellitus) ([place])   Relevant Orders   Bayer DCA Hb A1c Waived    Other Visit Diagnoses    Cellulitis of leg, right       Small patch of granulomatous tissue that has some erythema developing that could be cellulitis   Relevant Medications   cephALEXin (KEFLEX) 500 MG capsule   Atopic dermatitis       Relevant Medications   triamcinolone cream (KENALOG) 0.1 %       Follow up plan: Return in about 3 months (around 11/27/2015), or if symptoms worsen or fail to improve, for Recheck diabetes and hypertension.  Counseling provided for all of the vaccine components Orders Placed This Encounter  Procedures  . [facility] Hb A1c Hawkins, MD [facility] 08/27/2015, 11:30 AM

## 2015-09-24 ENCOUNTER — Encounter: Payer: Self-pay | Admitting: Family Medicine

## 2015-09-24 ENCOUNTER — Ambulatory Visit (INDEPENDENT_AMBULATORY_CARE_PROVIDER_SITE_OTHER): Payer: Medicare Other | Admitting: Family Medicine

## 2015-09-24 VITALS — BP 138/81 | HR 98 | Temp 98.0°F | Ht 64.75 in | Wt 228.0 lb

## 2015-09-24 DIAGNOSIS — J209 Acute bronchitis, unspecified: Secondary | ICD-10-CM | POA: Diagnosis not present

## 2015-09-24 MED ORDER — AZITHROMYCIN 250 MG PO TABS: ORAL_TABLET | ORAL | 0 refills | Status: DC

## 2015-09-24 MED ORDER — BENZONATATE 100 MG PO CAPS: 100.0000 mg | ORAL_CAPSULE | Freq: Three times a day (TID) | ORAL | 0 refills | Status: DC | PRN

## 2015-09-24 NOTE — Progress Notes (Signed)
BP 138/81   Pulse 98   Temp 98 F (36.7 C) (Oral)   Ht 5' 4.75" (1.645 m)   Wt 228 lb (103.4 kg)   BMI 38.23 kg/m    Subjective:    Patient ID: Anna Dickerson, female    DOB: 02-Nov-1929, 82 y.o.   MRN: [id number]  HPI: Anna Dickerson is a 82 y.o. female presenting on 09/24/2015 for Cough; Chest congestion; and Sinus drainage   HPI Cough and chest congestion and sinus drainage Patient has been having cough and chest congestion and sinus drainage that has been going on for the past 3 or 4 days. She denies any fevers or chills or shortness of breath or wheezing. She does feel like it is starting to go down into her chest. She has not tried any over-the-counter stuff yet. She has been trying to use her Flonase that she is prescribed. She is also been using her usual allergy pill that she takes all the time. She denies any sick contacts that she knows of.  Relevant past medical, surgical, family and social history reviewed and updated as indicated. Interim medical history since our last visit reviewed. Allergies and medications reviewed and updated.  Review of Systems  Constitutional: Negative for chills and fever.  HENT: Positive for congestion, postnasal drip, rhinorrhea, sinus pressure, sneezing and sore throat. Negative for ear discharge and ear pain.   Eyes: Negative for pain, redness and visual disturbance.  Respiratory: Positive for cough. Negative for chest tightness, shortness of breath and wheezing.   Cardiovascular: Negative for chest pain and leg swelling.  Genitourinary: Negative for difficulty urinating and dysuria.  Musculoskeletal: Negative for back pain and gait problem.  Skin: Negative for rash.  Neurological: Negative for light-headedness and headaches.  Psychiatric/Behavioral: Negative for agitation and behavioral problems.  All other systems reviewed and are negative.   Per HPI unless specifically indicated above     Objective:    BP 138/81    Pulse 98   Temp 98 F (36.7 C) (Oral)   Ht 5' 4.75" (1.645 m)   Wt 228 lb (103.4 kg)   BMI 38.23 kg/m   Wt Readings from Last 3 Encounters:  09/24/15 228 lb (103.4 kg)  08/27/15 223 lb 3.2 oz (101.2 kg)  07/23/15 225 lb 6.4 oz (102.2 kg)    Physical Exam  Constitutional: She is oriented to person, place, and time. She appears well-developed and well-nourished. No distress.  HENT:  Right Ear: Tympanic membrane, external ear and ear canal normal.  Left Ear: Tympanic membrane, external ear and ear canal normal.  Nose: Mucosal edema and rhinorrhea present. No epistaxis. Right sinus exhibits no maxillary sinus tenderness and no frontal sinus tenderness. Left sinus exhibits no maxillary sinus tenderness and no frontal sinus tenderness.  Mouth/Throat: Uvula is midline and mucous membranes are normal. Posterior oropharyngeal edema and posterior oropharyngeal erythema present. No oropharyngeal exudate or tonsillar abscesses.  Eyes: Conjunctivae are normal.  Cardiovascular: Normal rate, regular rhythm, normal heart sounds and intact distal pulses.   No murmur heard. Pulmonary/Chest: Effort normal and breath sounds normal. No respiratory distress. She has no wheezes.  Musculoskeletal: Normal range of motion. She exhibits no edema or tenderness.  Neurological: She is alert and oriented to person, place, and time. Coordination normal.  Skin: Skin is warm and dry. No rash noted. She is not diaphoretic.  Psychiatric: She has a normal mood and affect. Her behavior is normal.  Vitals reviewed.  Assessment & Plan:   Problem List Items Addressed This Visit    None    Visit Diagnoses    Acute bronchitis, unspecified organism    -  Primary   Relevant Medications   azithromycin (ZITHROMAX) 250 MG tablet   benzonatate (TESSALON) 100 MG capsule       Follow up plan: Return if symptoms worsen or fail to improve.  Counseling provided for all of the vaccine components No orders of the  defined types were placed in this encounter.   Caryl Pina, MD [facility] 09/24/2015, 11:26 AM

## 2015-09-28 ENCOUNTER — Ambulatory Visit (INDEPENDENT_AMBULATORY_CARE_PROVIDER_SITE_OTHER): Payer: Medicare Other | Admitting: *Deleted

## 2015-09-28 DIAGNOSIS — E538 Deficiency of other specified B group vitamins: Secondary | ICD-10-CM

## 2015-09-28 NOTE — Progress Notes (Signed)
Pt given Vit B12 inj Tolerated well 

## 2015-10-03 ENCOUNTER — Ambulatory Visit (HOSPITAL_COMMUNITY)
Admission: RE | Admit: 2015-10-03 | Discharge: 2015-10-03 | Disposition: A | Discharge status: Home / Self Care | Payer: Medicare Other | Source: Ambulatory Visit | Attending: Hematology & Oncology | Admitting: Hematology & Oncology

## 2015-10-03 ENCOUNTER — Other Ambulatory Visit: Payer: Self-pay | Admitting: Family Medicine

## 2015-10-03 DIAGNOSIS — Z1231 Encounter for screening mammogram for malignant neoplasm of breast: Secondary | ICD-10-CM | POA: Insufficient documentation

## 2015-10-03 DIAGNOSIS — F419 Anxiety disorder, unspecified: Principal | ICD-10-CM

## 2015-10-03 DIAGNOSIS — C50912 Malignant neoplasm of unspecified site of left female breast: Secondary | ICD-10-CM

## 2015-10-03 DIAGNOSIS — Z1239 Encounter for other screening for malignant neoplasm of breast: Secondary | ICD-10-CM

## 2015-10-03 DIAGNOSIS — F329 Major depressive disorder, single episode, unspecified: Secondary | ICD-10-CM

## 2015-10-04 ENCOUNTER — Other Ambulatory Visit: Payer: Self-pay | Admitting: Family Medicine

## 2015-10-04 DIAGNOSIS — F32A Depression, unspecified: Secondary | ICD-10-CM

## 2015-10-04 DIAGNOSIS — F419 Anxiety disorder, unspecified: Principal | ICD-10-CM

## 2015-10-04 DIAGNOSIS — F329 Major depressive disorder, single episode, unspecified: Secondary | ICD-10-CM

## 2015-10-25 ENCOUNTER — Other Ambulatory Visit: Payer: Self-pay | Admitting: Family Medicine

## 2015-10-29 ENCOUNTER — Ambulatory Visit (INDEPENDENT_AMBULATORY_CARE_PROVIDER_SITE_OTHER): Payer: Medicare Other | Admitting: *Deleted

## 2015-10-29 DIAGNOSIS — E538 Deficiency of other specified B group vitamins: Secondary | ICD-10-CM

## 2015-10-29 NOTE — Telephone Encounter (Signed)
Refill called to [facility] 

## 2015-10-29 NOTE — Progress Notes (Signed)
Pt given Vit B12 inj Tolerated well 

## 2015-10-29 NOTE — Telephone Encounter (Signed)
Go ahead and call in this refill

## 2015-11-13 ENCOUNTER — Other Ambulatory Visit: Payer: Self-pay | Admitting: Family Medicine

## 2015-11-13 DIAGNOSIS — I10 Essential (primary) hypertension: Secondary | ICD-10-CM

## 2015-11-27 ENCOUNTER — Ambulatory Visit: Payer: Medicare Other | Admitting: Family Medicine

## 2015-12-03 ENCOUNTER — Encounter: Payer: Self-pay | Admitting: Family Medicine

## 2015-12-03 ENCOUNTER — Encounter (INDEPENDENT_AMBULATORY_CARE_PROVIDER_SITE_OTHER): Payer: Self-pay

## 2015-12-03 ENCOUNTER — Ambulatory Visit (INDEPENDENT_AMBULATORY_CARE_PROVIDER_SITE_OTHER): Payer: Medicare Other

## 2015-12-03 ENCOUNTER — Ambulatory Visit (INDEPENDENT_AMBULATORY_CARE_PROVIDER_SITE_OTHER): Payer: Medicare Other | Admitting: Family Medicine

## 2015-12-03 ENCOUNTER — Other Ambulatory Visit: Payer: Self-pay | Admitting: Family Medicine

## 2015-12-03 VITALS — BP 141/82 | HR 106 | Temp 97.3°F | Ht 64.75 in | Wt 225.0 lb

## 2015-12-03 DIAGNOSIS — Z13820 Encounter for screening for osteoporosis: Secondary | ICD-10-CM

## 2015-12-03 DIAGNOSIS — F32A Depression, unspecified: Secondary | ICD-10-CM

## 2015-12-03 DIAGNOSIS — Z23 Encounter for immunization: Secondary | ICD-10-CM

## 2015-12-03 DIAGNOSIS — F418 Other specified anxiety disorders: Secondary | ICD-10-CM

## 2015-12-03 DIAGNOSIS — E538 Deficiency of other specified B group vitamins: Secondary | ICD-10-CM | POA: Diagnosis not present

## 2015-12-03 DIAGNOSIS — Z780 Asymptomatic menopausal state: Secondary | ICD-10-CM

## 2015-12-03 DIAGNOSIS — F419 Anxiety disorder, unspecified: Secondary | ICD-10-CM

## 2015-12-03 DIAGNOSIS — D492 Neoplasm of unspecified behavior of bone, soft tissue, and skin: Secondary | ICD-10-CM | POA: Diagnosis not present

## 2015-12-03 DIAGNOSIS — E1139 Type 2 diabetes mellitus with other diabetic ophthalmic complication: Secondary | ICD-10-CM | POA: Diagnosis not present

## 2015-12-03 DIAGNOSIS — F329 Major depressive disorder, single episode, unspecified: Secondary | ICD-10-CM

## 2015-12-03 DIAGNOSIS — I10 Essential (primary) hypertension: Secondary | ICD-10-CM

## 2015-12-03 LAB — BAYER DCA HB A1C WAIVED: HB A1C (BAYER DCA - WAIVED): 6.4 % (ref <7.0)

## 2015-12-03 NOTE — Progress Notes (Signed)
BP (!) 141/82   Pulse (!) 106   Temp 97.3 F (36.3 C) (Oral)   Ht 5' 4.75" (1.645 m)   Wt 225 lb (102.1 kg)   BMI 37.73 kg/m    Subjective:    Patient ID: Anna Dickerson, female    DOB: 20-Mar-1929, 82 y.o.   MRN: [id number]  HPI: Anna Dickerson is a 82 y.o. female presenting on 12/03/2015 for Hypertension and Diabetes   HPI Hypertension Patient is coming in for hypertension recheck today. Her blood pressure today is 141/82. She is currently on hydrochlorothiazide and Azor and clonidine. She denies any lightheadedness or dizziness. Because of her age category this is an acceptable blood pressure. Patient denies headaches, blurred vision, chest pains, shortness of breath, or weakness. Denies any side effects from medication and is content with current medication.   Type 2 diabetes Patient is coming in for type 2 diabetes recheck. Her last hemoglobin A1c in 08/27/2015 was 6.7. She is due for recheck today. She is currently on metformin 1000 twice daily. She denies any issues with the metformin. She is currently on an ARB for renal protection. She is also on a statin for cardiac protection. Per patient she had an eye exam in March 2017. She checks her blood sugars infrequently at home but denies any hypoglycemic episodes.  Anxiety and depression recheck Patient is coming in for an anxiety and depression recheck as well. She is currently on Effexor 75 mg and is doing very well. She denies any issues with sleeping or suicidal ideations. She has some anxiety intermittently but nothing overwhelming and feels like she is doing very well overall with that.  Nonhealing skin lesion Patient has a skin lesion on her right lateral lower leg that initially looked like an ulceration and we've been trying to heal with creams and it has mostly healed up but now she has a center part that is a little more raised and is still not healing well. She says is very pruritic but not painful. She  continues to use lotions and steroid creams which are helping the surrounding area but not the central spot. She denies any drainage out of it any further.  Relevant past medical, surgical, family and social history reviewed and updated as indicated. Interim medical history since our last visit reviewed. Allergies and medications reviewed and updated.  Review of Systems  Constitutional: Negative for chills and fever.  HENT: Negative for congestion, ear discharge and ear pain.   Eyes: Negative for redness and visual disturbance.  Respiratory: Negative for chest tightness and shortness of breath.   Cardiovascular: Negative for chest pain and leg swelling.  Genitourinary: Negative for difficulty urinating and dysuria.  Musculoskeletal: Negative for back pain and gait problem.  Skin: Positive for color change and wound. Negative for rash.  Neurological: Negative for dizziness, weakness, light-headedness, numbness and headaches.  Psychiatric/Behavioral: Negative for agitation and behavioral problems.  All other systems reviewed and are negative.   Per HPI unless specifically indicated above     Medication List       Accurate as of 12/03/15  2:24 PM. Always use your most recent med list.          aliskiren 300 MG tablet Commonly known as:  TEKTURNA Take 1 tablet (300 mg total) by mouth daily.   allopurinol 100 MG tablet Commonly known as:  ZYLOPRIM Take 1 tablet (100 mg total) by mouth daily. 1 tablet   amLODipine-olmesartan 10-40 MG tablet Commonly known  asLeotis Shames Take 1 tablet by mouth daily.   aspirin 81 MG tablet Take 81 mg by mouth daily.   atorvastatin 20 MG tablet Commonly known as:  LIPITOR Take 1 tablet (20 mg total) by mouth at bedtime. 1 tablet   cloNIDine 0.1 MG tablet Commonly known as:  CATAPRES Take 1 tablet (0.1 mg total) by mouth 2 (two) times daily. 1 tablet   fluticasone 50 MCG/ACT nasal spray Commonly known as:  FLONASE Place 1 spray into both  nostrils 2 (two) times daily as needed for allergies or rhinitis.   hydrochlorothiazide 12.5 MG capsule Commonly known as:  MICROZIDE TAKE 1 CAPSULE (12.5 MG TOTAL) BY MOUTH DAILY.   LORazepam 0.5 MG tablet Commonly known as:  ATIVAN TAKE 1 TABLET BY MOUTH TWICE A DAY AS NEEDED   metFORMIN 1000 MG tablet Commonly known as:  GLUCOPHAGE Take 1,000 mg by mouth 2 (two) times daily with a meal.   metoprolol 50 MG tablet Commonly known as:  LOPRESSOR Take 1 tablet (50 mg total) by mouth 2 (two) times daily.   mirabegron ER 50 MG Tb24 tablet Commonly known as:  MYRBETRIQ Take 1 tablet (50 mg total) by mouth daily.   PRESERVISION AREDS 2 Caps Take 2 capsules by mouth 2 (two) times daily.   triamcinolone cream 0.1 % Commonly known as:  KENALOG Apply 1 application topically 2 (two) times daily.   venlafaxine XR 75 MG 24 hr capsule Commonly known as:  EFFEXOR-XR TAKE 1 CAPSULE (75 MG TOTAL) BY MOUTH DAILY WITH BREAKFAST.   venlafaxine 37.5 MG tablet Commonly known as:  EFFEXOR TAKE 1 TABLET (37.5 MG TOTAL) BY MOUTH 2 (TWO) TIMES DAILY.   VESICARE 10 MG tablet Generic drug:  solifenacin Take 10 mg by mouth daily.          Objective:    BP (!) 141/82   Pulse (!) 106   Temp 97.3 F (36.3 C) (Oral)   Ht 5' 4.75" (1.645 m)   Wt 225 lb (102.1 kg)   BMI 37.73 kg/m   Wt Readings from Last 3 Encounters:  12/03/15 225 lb (102.1 kg)  09/24/15 228 lb (103.4 kg)  08/27/15 223 lb 3.2 oz (101.2 kg)    Physical Exam  Constitutional: She is oriented to person, place, and time. She appears well-developed and well-nourished. No distress.  Eyes: Conjunctivae are normal.  Cardiovascular: Normal rate, regular rhythm, normal heart sounds and intact distal pulses.   No murmur heard. Pulmonary/Chest: Effort normal and breath sounds normal. No respiratory distress. She has no wheezes. She has no rales.  Musculoskeletal: Normal range of motion. She exhibits no edema or tenderness.    Neurological: She is alert and oriented to person, place, and time. Coordination normal.  Skin: Skin is warm and dry. Lesion (Raised 1 cm irregular papule with no surrounding erythema or warmth or drainage. Surrounding skin ulceration that was there before is now healed.) noted. No rash noted. She is not diaphoretic. No erythema.  Psychiatric: She has a normal mood and affect. Her behavior is normal.  Nursing note and vitals reviewed.     Assessment & Plan:   Problem List Items Addressed This Visit      Cardiovascular and Mediastinum   HTN (hypertension) - Primary   Relevant Orders   CMP14+EGFR (Completed)     Endocrine   DM (diabetes mellitus) ([place])   Relevant Orders   Bayer DCA Hb A1c Waived (Completed)   Lipid panel (Completed)  Other   Anxiety and depression    Other Visit Diagnoses    Screening for osteoporosis       Atypical squamoproliferative skin lesion       Will schedule back for biopsy, just not healing on her right lower leg   Encounter for immunization       Relevant Orders   Flu Vaccine QUAD 36+ mos IM (Completed)       Follow up plan: Return in about 3 months (around 03/04/2016), or if symptoms worsen or fail to improve, for Return in 3 months for diabetes recheck, return sooner for skin lesion biopsy is possible.  Counseling provided for all of the vaccine components Orders Placed This Encounter  Procedures  . DG Bone Density  . Bayer DCA Hb A1c Waived  . CMP14+EGFR  . Lipid panel    Caryl Pina, MD [facility] 12/03/2015, 2:24 PM

## 2015-12-04 LAB — CMP14+EGFR
ALBUMIN: 4.5 g/dL (ref 3.5–4.7)
ALK PHOS: 69 IU/L (ref 39–117)
ALT: 20 IU/L (ref 0–32)
AST: 22 IU/L (ref 0–40)
Albumin/Globulin Ratio: 1.5 (ref 1.2–2.2)
BUN / CREAT RATIO: 23 (ref 12–28)
BUN: 30 mg/dL — AB (ref 8–27)
Bilirubin Total: 0.3 mg/dL (ref 0.0–1.2)
CO2: 21 mmol/L (ref 18–29)
CREATININE: 1.29 mg/dL — AB (ref 0.57–1.00)
Calcium: 9.4 mg/dL (ref 8.7–10.3)
Chloride: 102 mmol/L (ref 96–106)
GFR calc Af Amer: 44 mL/min/{1.73_m2} — ABNORMAL LOW (ref >59)
GFR calc non Af Amer: 38 mL/min/{1.73_m2} — ABNORMAL LOW (ref >59)
Globulin, Total: 3 g/dL (ref 1.5–4.5)
Glucose: 118 mg/dL — ABNORMAL HIGH (ref 65–99)
Potassium: 4.3 mmol/L (ref 3.5–5.2)
Sodium: 141 mmol/L (ref 134–144)
Total Protein: 7.5 g/dL (ref 6.0–8.5)

## 2015-12-04 LAB — LIPID PANEL
CHOLESTEROL TOTAL: 121 mg/dL (ref 100–199)
Chol/HDL Ratio: 3 ratio units (ref 0.0–4.4)
HDL: 40 mg/dL (ref >39)
LDL CALC: 34 mg/dL (ref 0–99)
TRIGLYCERIDES: 236 mg/dL — AB (ref 0–149)
VLDL CHOLESTEROL CAL: 47 mg/dL — AB (ref 5–40)

## 2015-12-11 ENCOUNTER — Other Ambulatory Visit: Payer: Self-pay | Admitting: Family Medicine

## 2015-12-11 DIAGNOSIS — I10 Essential (primary) hypertension: Secondary | ICD-10-CM

## 2015-12-12 ENCOUNTER — Ambulatory Visit (INDEPENDENT_AMBULATORY_CARE_PROVIDER_SITE_OTHER): Payer: Medicare Other | Admitting: Family Medicine

## 2015-12-12 ENCOUNTER — Encounter: Payer: Self-pay | Admitting: Family Medicine

## 2015-12-12 VITALS — BP 179/87 | HR 103 | Temp 96.9°F | Ht 64.75 in | Wt 225.0 lb

## 2015-12-12 DIAGNOSIS — D492 Neoplasm of unspecified behavior of bone, soft tissue, and skin: Secondary | ICD-10-CM

## 2015-12-12 DIAGNOSIS — L570 Actinic keratosis: Secondary | ICD-10-CM

## 2015-12-12 NOTE — Progress Notes (Signed)
BP (!) 159/89   Pulse (!) 103   Temp (!) 96.9 F (36.1 C) (Oral)   Ht 5' 4.75" (1.645 m)   Wt 225 lb (102.1 kg)   BMI 37.73 kg/m    Subjective:    Patient ID: Anna Dickerson, female    DOB: 06/09/1929, 82 y.o.   MRN: [id number]  HPI: Anna Dickerson is a 82 y.o. female presenting on 12/12/2015 for Lesion on right lower leg   HPI Nonhealing skin lesion Patient has been fighting a skin lesion on her right lower extremity that has been an issue for at least 6 months, maybe closer to a year. A used to look like an ulceration and had a lot of redness in the surrounding sites but now it is more raised. She has gone through a prolonged courses of skin wound healing and the site will heal very well except for the Center where she has a spot that continues to not heal. She denies any drainage or redness or warmth. She denies any pain associated with it.  Relevant past medical, surgical, family and social history reviewed and updated as indicated. Interim medical history since our last visit reviewed. Allergies and medications reviewed and updated.  Review of Systems  Skin: Negative for color change and rash.    Per HPI unless specifically indicated above     Medication List       Accurate as of 12/12/15  1:51 PM. Always use your most recent med list.          aliskiren 300 MG tablet Commonly known as:  TEKTURNA Take 1 tablet (300 mg total) by mouth daily.   allopurinol 100 MG tablet Commonly known as:  ZYLOPRIM Take 1 tablet (100 mg total) by mouth daily. 1 tablet   amLODipine-olmesartan 10-40 MG tablet Commonly known as:  AZOR TAKE 1 TABLET BY MOUTH DAILY.   aspirin 81 MG tablet Take 81 mg by mouth daily.   atorvastatin 20 MG tablet Commonly known as:  LIPITOR Take 1 tablet (20 mg total) by mouth at bedtime. 1 tablet   cloNIDine 0.1 MG tablet Commonly known as:  CATAPRES Take 1 tablet (0.1 mg total) by mouth 2 (two) times daily. 1 tablet   fluticasone  50 MCG/ACT nasal spray Commonly known as:  FLONASE Place 1 spray into both nostrils 2 (two) times daily as needed for allergies or rhinitis.   hydrochlorothiazide 12.5 MG capsule Commonly known as:  MICROZIDE TAKE 1 CAPSULE (12.5 MG TOTAL) BY MOUTH DAILY.   LORazepam 0.5 MG tablet Commonly known as:  ATIVAN TAKE 1 TABLET BY MOUTH TWICE A DAY AS NEEDED   metFORMIN 1000 MG tablet Commonly known as:  GLUCOPHAGE Take 1,000 mg by mouth 2 (two) times daily with a meal.   metoprolol 50 MG tablet Commonly known as:  LOPRESSOR Take 1 tablet (50 mg total) by mouth 2 (two) times daily.   mirabegron ER 50 MG Tb24 tablet Commonly known as:  MYRBETRIQ Take 1 tablet (50 mg total) by mouth daily.   PRESERVISION AREDS 2 Caps Take 2 capsules by mouth 2 (two) times daily.   triamcinolone cream 0.1 % Commonly known as:  KENALOG Apply 1 application topically 2 (two) times daily.   venlafaxine XR 75 MG 24 hr capsule Commonly known as:  EFFEXOR-XR TAKE 1 CAPSULE (75 MG TOTAL) BY MOUTH DAILY WITH BREAKFAST.   venlafaxine 37.5 MG tablet Commonly known as:  EFFEXOR TAKE 1 TABLET (37.5 MG TOTAL) BY MOUTH  2 (TWO) TIMES DAILY.   VESICARE 10 MG tablet Generic drug:  solifenacin Take 10 mg by mouth daily.          Objective:    BP (!) 159/89   Pulse (!) 103   Temp (!) 96.9 F (36.1 C) (Oral)   Ht 5' 4.75" (1.645 m)   Wt 225 lb (102.1 kg)   BMI 37.73 kg/m   Wt Readings from Last 3 Encounters:  12/12/15 225 lb (102.1 kg)  12/03/15 225 lb (102.1 kg)  09/24/15 228 lb (103.4 kg)    Physical Exam  Skin: Lesion noted.       Skin lesion Biopsy: Site prepped with Betadine, 2% lidocaine with epinephrine was used for local anesthesia, 3mL. Punch biopsy was made 3 mm in size.  Pressure dressing was placed and topical antibiotic was used. Procedure was tolerated well with minimal bleeding     Assessment & Plan:   Problem List Items Addressed This Visit    None    Visit Diagnoses     Atypical squamoproliferative skin lesion    -  Primary   Relevant Orders   Pathology       Follow up plan: Return if symptoms worsen or fail to improve.  Counseling provided for all of the vaccine components No orders of the defined types were placed in this encounter.   Caryl Pina, MD [facility] 12/12/2015, 1:51 PM

## 2015-12-14 LAB — PATHOLOGY

## 2015-12-17 NOTE — Addendum Note (Signed)
Addended by: Denyce Robert on: 12/17/2015 10:37 AM   Modules accepted: Orders

## 2015-12-22 ENCOUNTER — Other Ambulatory Visit: Payer: Self-pay | Admitting: Family Medicine

## 2015-12-22 DIAGNOSIS — I10 Essential (primary) hypertension: Secondary | ICD-10-CM

## 2016-01-09 ENCOUNTER — Other Ambulatory Visit: Payer: Self-pay | Admitting: Family Medicine

## 2016-01-09 NOTE — Telephone Encounter (Signed)
Patient last seen in office on 12-12-15. Rx last filled on 11-27 for #30 with 1 Rf. Please advise and route to Pool A so nurse can phone in to pharmacy

## 2016-01-09 NOTE — Telephone Encounter (Signed)
Go ahead and call in a refill and give her enough to get to her next appointment which should be 2 months from now

## 2016-02-06 ENCOUNTER — Ambulatory Visit (HOSPITAL_COMMUNITY): Payer: Medicare PPO | Admitting: Oncology

## 2016-02-06 ENCOUNTER — Encounter (HOSPITAL_COMMUNITY): Payer: Self-pay | Admitting: Oncology

## 2016-02-06 ENCOUNTER — Encounter (HOSPITAL_COMMUNITY): Payer: Medicare Other

## 2016-02-06 ENCOUNTER — Other Ambulatory Visit (HOSPITAL_COMMUNITY): Payer: Medicare PPO

## 2016-02-06 ENCOUNTER — Encounter (HOSPITAL_COMMUNITY): Payer: Medicare Other | Attending: Oncology | Admitting: Oncology

## 2016-02-06 VITALS — BP 160/74 | HR 106 | Temp 97.7°F | Resp 18 | Ht 67.5 in | Wt 229.0 lb

## 2016-02-06 DIAGNOSIS — Z1239 Encounter for other screening for malignant neoplasm of breast: Secondary | ICD-10-CM

## 2016-02-06 DIAGNOSIS — E611 Iron deficiency: Secondary | ICD-10-CM

## 2016-02-06 DIAGNOSIS — C50912 Malignant neoplasm of unspecified site of left female breast: Secondary | ICD-10-CM | POA: Diagnosis present

## 2016-02-06 DIAGNOSIS — D509 Iron deficiency anemia, unspecified: Secondary | ICD-10-CM

## 2016-02-06 DIAGNOSIS — E538 Deficiency of other specified B group vitamins: Secondary | ICD-10-CM | POA: Diagnosis not present

## 2016-02-06 DIAGNOSIS — Z170 Estrogen receptor positive status [ER+]: Secondary | ICD-10-CM

## 2016-02-06 LAB — COMPREHENSIVE METABOLIC PANEL
ALBUMIN: 4 g/dL (ref 3.5–5.0)
ALK PHOS: 61 U/L (ref 38–126)
ALT: 23 U/L (ref 14–54)
ANION GAP: 10 (ref 5–15)
AST: 26 U/L (ref 15–41)
BILIRUBIN TOTAL: 0.5 mg/dL (ref 0.3–1.2)
BUN: 33 mg/dL — ABNORMAL HIGH (ref 6–20)
CALCIUM: 9.3 mg/dL (ref 8.9–10.3)
CO2: 26 mmol/L (ref 22–32)
Chloride: 102 mmol/L (ref 101–111)
Creatinine, Ser: 1.39 mg/dL — ABNORMAL HIGH (ref 0.44–1.00)
GFR calc non Af Amer: 34 mL/min — ABNORMAL LOW (ref >60)
GFR, EST AFRICAN AMERICAN: 39 mL/min — AB (ref >60)
Glucose, Bld: 151 mg/dL — ABNORMAL HIGH (ref 65–99)
POTASSIUM: 4.2 mmol/L (ref 3.5–5.1)
Sodium: 138 mmol/L (ref 135–145)
TOTAL PROTEIN: 7.8 g/dL (ref 6.5–8.1)

## 2016-02-06 LAB — VITAMIN B12: VITAMIN B 12: 240 pg/mL (ref 180–914)

## 2016-02-06 LAB — CBC WITH DIFFERENTIAL/PLATELET
BASOS PCT: 1 %
Basophils Absolute: 0.1 10*3/uL (ref 0.0–0.1)
Eosinophils Absolute: 0.4 10*3/uL (ref 0.0–0.7)
Eosinophils Relative: 4 %
HEMATOCRIT: 36 % (ref 36.0–46.0)
Hemoglobin: 11.7 g/dL — ABNORMAL LOW (ref 12.0–15.0)
Lymphocytes Relative: 43 %
Lymphs Abs: 4.2 10*3/uL — ABNORMAL HIGH (ref 0.7–4.0)
MCH: 29.9 pg (ref 26.0–34.0)
MCHC: 32.5 g/dL (ref 30.0–36.0)
MCV: 92.1 fL (ref 78.0–100.0)
MONO ABS: 0.5 10*3/uL (ref 0.1–1.0)
MONOS PCT: 5 %
NEUTROS ABS: 4.7 10*3/uL (ref 1.7–7.7)
Neutrophils Relative %: 47 %
Platelets: 214 10*3/uL (ref 150–400)
RBC: 3.91 MIL/uL (ref 3.87–5.11)
RDW: 14 % (ref 11.5–15.5)
WBC: 9.8 10*3/uL (ref 4.0–10.5)

## 2016-02-06 LAB — FERRITIN: FERRITIN: 48 ng/mL (ref 11–307)

## 2016-02-06 NOTE — Progress Notes (Signed)
[name], MD [place] [id number]  Malignant neoplasm of left breast in female, estrogen receptor positive, unspecified site of breast ([place]) - Plan: CBC with Differential, Comprehensive metabolic panel, MM SCREENING BREAST TOMO BILATERAL  Iron deficiency - Plan: CBC with Differential, Ferritin, Iron and TIBC  B12 deficiency - Plan: CBC with Differential, CANCELED: Vitamin B12  CURRENT THERAPY: Surveillance per NCCN guidelines and monthly B12 injections.  INTERVAL HISTORY: Anna Dickerson 83 y.o. female returns for followup of Stage I (T1A NO MO) left sided breast invasive carcinoma, status post 5 years worth of tamoxifen, lumpectomy, and adjuvant radiation therapy. AND B12 deficiency, on monthly B12 injections. AND Iron deficiency anemia  She is doing well.  She notes that she is up to date on mammogram.  She is doing self breast exams and denies any new or abnormal findings.  She denies any blood in stool or dark stools.  She did get her influenza vaccine this year.  She denies any issues.  She is getting her B12 injections through her primary care provider monthly due to ease of access.  Review of Systems  Constitutional: Negative.  Negative for chills, fever and weight loss.  HENT: Negative.   Eyes: Negative.   Respiratory: Negative.  Negative for cough.   Cardiovascular: Negative.  Negative for chest pain.  Gastrointestinal: Negative.  Negative for constipation, diarrhea, nausea and vomiting.  Genitourinary: Negative.   Musculoskeletal: Negative.   Skin: Negative.   Neurological: Negative.  Negative for weakness.  Endo/Heme/Allergies: Negative.   Psychiatric/Behavioral: Negative.     Past Medical History:  Diagnosis Date  . B12 deficiency 02/08/2015  . Cancer [facility]) [id number]   breast  . Diabetes mellitus   . DM (diabetes mellitus) ([place]) 12/31/2010  . HTN (hypertension) 12/31/2010  . Hypertension   . Iron deficiency  02/24/2012  . Macular degeneration 12/31/2010  . Macular degeneration, left eye   . Obesity 12/31/2010  . Stage I left-sided Breast cancer 12/31/2010    Past Surgical History:  Procedure Laterality Date  . bilateral cataract with intraocular lens implant    . Bladder tacked-up    . BREAST SURGERY  left    . REPLACEMENT TOTAL KNEE BILATERAL      Family History  Problem Relation Age of Onset  . Cancer Mother     Social History   Social History  . Marital status: Married    Spouse name: N/A  . Number of children: N/A  . Years of education: N/A   Social History Main Topics  . Smoking status: Never Smoker  . Smokeless tobacco: Never Used  . Alcohol use No  . Drug use: No  . Sexual activity: Not Asked   Other Topics Concern  . None   Social History Narrative  . None     PHYSICAL EXAMINATION  ECOG PERFORMANCE STATUS: 1 - Symptomatic but completely ambulatory  Vitals:   02/06/16 1022  BP: (!) 160/74  Pulse: (!) 106  Resp: 18  Temp: 97.7 F (36.5 C)     GENERAL:alert, no distress, well nourished, well developed, comfortable, cooperative, obese, smiling and unaccompanied  SKIN: skin color, texture, turgor are normal, no rashes or significant lesions HEAD: Normocephalic, No masses, lesions, tenderness or abnormalities EYES: normal, EOMI, Conjunctiva are pink and non-injected EARS: External ears normal OROPHARYNX:lips, buccal mucosa, and tongue normal and mucous membranes are moist  NECK: supple, no adenopathy, trachea midline LYMPH:  no palpable lymphadenopathy,  no hepatosplenomegaly BREAST:right breast normal without mass, skin or nipple changes or axillary nodes, post-lumpectomy site well healed and free of suspicious changes.  Chronic B/L nipple inversion LUNGS: clear to auscultation and percussion HEART: regular rate & rhythm, no murmurs, no gallops, S1 normal and S2 normal ABDOMEN:abdomen soft, non-tender, obese, normal bowel sounds and no masses or  organomegaly BACK: Back symmetric, no curvature., No CVA tenderness EXTREMITIES:less then 2 second capillary refill, no joint deformities, effusion, or inflammation, no edema, no skin discoloration, no cyanosis  NEURO: alert & oriented x 3 with fluent speech, no focal motor/sensory deficits, gait normal   LABORATORY DATA: CBC    Component Value Date/Time   WBC 9.8 02/06/2016 0921   RBC 3.91 02/06/2016 0921   HGB 11.7 (L) 02/06/2016 0921   HCT 36.0 02/06/2016 0921   PLT 214 02/06/2016 0921   MCV 92.1 02/06/2016 0921   MCH 29.9 02/06/2016 0921   MCHC 32.5 02/06/2016 0921   RDW 14.0 02/06/2016 0921   LYMPHSABS 4.2 (H) 02/06/2016 0921   MONOABS 0.5 02/06/2016 0921   EOSABS 0.4 02/06/2016 0921   BASOSABS 0.1 02/06/2016 0921      Chemistry      Component Value Date/Time   NA 138 02/06/2016 0921   NA 141 12/03/2015 1458   K 4.2 02/06/2016 0921   CL 102 02/06/2016 0921   CO2 26 02/06/2016 0921   BUN 33 (H) 02/06/2016 0921   BUN 30 (H) 12/03/2015 1458   CREATININE 1.39 (H) 02/06/2016 0921      Component Value Date/Time   CALCIUM 9.3 02/06/2016 0921   ALKPHOS 61 02/06/2016 0921   AST 26 02/06/2016 0921   ALT 23 02/06/2016 0921   BILITOT 0.5 02/06/2016 0921   BILITOT 0.3 12/03/2015 1458      Lab Results  Component Value Date   IRON 53 02/23/2012   TIBC 401 02/23/2012   FERRITIN 39 01/31/2015   Lab Results  Component Value Date   VITAMINB12 131 (L) 01/31/2015    PENDING LABS:   RADIOGRAPHIC STUDIES:  No results found.   PATHOLOGY:    ASSESSMENT AND PLAN:  Stage I left-sided Breast cancer Stage I (T1A N0 M0) left sided breast carcinoma, invasive disease, associated with some DCIS, status post lumpectomy, followed her radiation therapy was surgery on August 2007. She had an ER positive tumor and 93% PR +90% HER-2/neu negative, Ki-67 marker low 8%, high grade DCIS however was seen and a little microinvasion was also found. The maximum size of the invasive  compound was 4 mm. She is status post 5 years of tamoxifen 20 mg daily in 2012 (starting on 12/23/2005). She tolerated this well.   Labs today: CBC diff, CMET, B12, ferritin.  I personally reviewed and went over laboratory results with the patient.  The results are noted within this dictation.  Labs in 12 months: CBC diff, CMET, and ferritin.  I personally reviewed and went over radiographic studies with the patient.  The results are noted within this dictation.  Mammogram is up to date and last performed in September 2017.  She will be due in September 2018 for her next annual mammogram.  Order placed for mammogram in September 2018.  Return in 12 months for follow-up and breast exam.    B12 deficiency B12 deficiency, on IM B12 replacement therapy by her primary care provider..  She missed her December 2017 dose.  Labs today: CBC, B12  Supportive therapy plan deleted from an oncology perspective since  this is now being completed by primary care provider.  Episodes of care if resolved as well.  Iron deficiency Iron deficiency in the setting of Stage III chronic renal disease, requiring IV iron replacement therapy in the past.  Labs today: CBC, ferritin.  I personally reviewed and went over laboratory results with the patient.  The results are noted within this dictation.  Labs in in 12 months: CBC, iron/TIBC, ferritin.   ORDERS PLACED FOR THIS ENCOUNTER: Orders Placed This Encounter  Procedures  . MM SCREENING BREAST TOMO BILATERAL  . CBC with Differential  . Comprehensive metabolic panel  . Ferritin  . Iron and TIBC    MEDICATIONS PRESCRIBED THIS ENCOUNTER: No orders of the defined types were placed in this encounter.   THERAPY PLAN:  Continue B12 injections monthly with primary care provider.  NCCN guidelines recommends the following surveillance for invasive breast cancer (2.2017):  A. History and Physical exam 1-4 times per year as clinically appropriate for 5  years, then annually.  B. Periodic screening for changes in family history and referral to genetics counseling as indicated  C. Educate, monitor, and refer to lymphedema management.  D. Mammography every 12 months  E. Routine imaging of reconstructed breast is not indicated.  F. In the absence of clinical signs and symptoms suggestive of recurrent disease, there is no indication for laboratory or imaging studies for metastases screening.  G. Women on Tamoxifen: annual gynecologic assessment every 12 months if uterus is present.  H. Women on aromatase inhibitor or who experience ovarian failure secondary to treatment should have monitoring of bone health with a bone mineral density determination at baseline and periodically thereafter.  I. Assess and encourage adherence to adjuvant endocrine therapy.  J. Evidence suggests that active lifestyle, healthy diet, limited alcohol intake, and achieving and maintaining an ideal body weight (20-25 BMI) may lead to optimal breast cancer outcomes.   All questions were answered. The patient knows to call the clinic with any problems, questions or concerns. We can certainly see the patient much sooner if necessary.  Patient and plan discussed with Dr. Ancil Linsey and she is in agreement with the aforementioned.   This note is electronically signed by: Robynn Pane, PA-C 02/06/2016 10:30 AM

## 2016-02-06 NOTE — Patient Instructions (Signed)
[place] at [facility] Discharge Instructions  RECOMMENDATIONS MADE BY THE CONSULTANT AND ANY TEST RESULTS WILL BE SENT TO YOUR REFERRING PHYSICIAN.  You were seen today by Kirby Crigler PA-C. Mammogram in September 2018. Continue B12 injections with your primary care doctor. Return in 12 months for labs and follow up.   Thank you for choosing [place] at [facility] to provide your oncology and hematology care.  To afford each patient quality time with our provider, please arrive at least 15 minutes before your scheduled appointment time.    If you have a lab appointment with the [facility] please come in thru the  Main Entrance and check in at the main information desk  You need to re-schedule your appointment should you arrive 10 or more minutes late.  We strive to give you quality time with our providers, and arriving late affects you and other patients whose appointments are after yours.  Also, if you no show three or more times for appointments you may be dismissed from the clinic at the providers discretion.     Again, thank you for choosing [facility].  Our hope is that these requests will decrease the amount of time that you wait before being seen by our physicians.       _____________________________________________________________  Should you have questions after your visit to [facility], please contact our office at [phone] between the hours of 8:30 a.m. and 4:30 p.m.  Voicemails left after 4:30 p.m. will not be returned until the following business day.  For prescription refill requests, have your pharmacy contact our office.       Resources For Cancer Patients and their Caregivers ? American Cancer Society: Can assist with transportation, wigs, general needs, runs Look Good Feel Better.        (534)460-3066 ? Cancer Care: Provides financial assistance, online support groups,  medication/co-pay assistance.  1-800-813-HOPE 505-336-8744) ? [facility] Assists [place] cancer patients and their families through emotional , educational and financial support.  (779)404-5865 ? [place] DSS Where to apply for food stamps, Medicaid and utility assistance. (415)806-0056 ? [facility]: Transportation to medical appointments. 602 142 9909 ? Social Security Administration: May apply for disability if have a Stage IV cancer. 239-627-6857 517-863-0648 ? [employer], Disability and Transit Services: Assists with nutrition, care and transit needs. [place] Support Programs: @10RELATIVEDAYS @ > Cancer Support Group  2nd Tuesday of the month 1pm-2pm, Journey Room  > Creative Journey  3rd Tuesday of the month 1130am-1pm, Journey Room  > Look Good Feel Better  1st Wednesday of the month 10am-12 noon, Journey Room (Call [facility] to register [phone])

## 2016-02-06 NOTE — Assessment & Plan Note (Signed)
Iron deficiency in the setting of Stage III chronic renal disease, requiring IV iron replacement therapy in the past.  Labs today: CBC, ferritin.  I personally reviewed and went over laboratory results with the patient.  The results are noted within this dictation.  Labs in in 12 months: CBC, iron/TIBC, ferritin.

## 2016-02-06 NOTE — Assessment & Plan Note (Addendum)
Stage I (T1A N0 M0) left sided breast carcinoma, invasive disease, associated with some DCIS, status post lumpectomy, followed her radiation therapy was surgery on August 2007. She had an ER positive tumor and 93% PR +90% HER-2/neu negative, Ki-67 marker low 8%, high grade DCIS however was seen and a little microinvasion was also found. The maximum size of the invasive compound was 4 mm. She is status post 5 years of tamoxifen 20 mg daily in 2012 (starting on 12/23/2005). She tolerated this well.   Labs today: CBC diff, CMET, B12, ferritin.  I personally reviewed and went over laboratory results with the patient.  The results are noted within this dictation.  Labs in 12 months: CBC diff, CMET, and ferritin.  I personally reviewed and went over radiographic studies with the patient.  The results are noted within this dictation.  Mammogram is up to date and last performed in September 2017.  She will be due in September 2018 for her next annual mammogram.  Order placed for mammogram in September 2018.  Return in 12 months for follow-up and breast exam.

## 2016-02-06 NOTE — Assessment & Plan Note (Addendum)
B12 deficiency, on IM B12 replacement therapy by her primary care provider..  She missed her December 2017 dose.  Labs today: CBC, B12  Supportive therapy plan deleted from an oncology perspective since this is now being completed by primary care provider.  Episodes of care if resolved as well.

## 2016-02-08 ENCOUNTER — Other Ambulatory Visit: Payer: Self-pay | Admitting: Family Medicine

## 2016-02-08 ENCOUNTER — Telehealth: Payer: Self-pay | Admitting: Family Medicine

## 2016-02-08 DIAGNOSIS — I10 Essential (primary) hypertension: Secondary | ICD-10-CM

## 2016-02-11 NOTE — Telephone Encounter (Signed)
corrected

## 2016-02-26 ENCOUNTER — Ambulatory Visit (INDEPENDENT_AMBULATORY_CARE_PROVIDER_SITE_OTHER): Payer: Medicare Other | Admitting: *Deleted

## 2016-02-26 ENCOUNTER — Encounter: Payer: Self-pay | Admitting: Pharmacist

## 2016-02-26 ENCOUNTER — Ambulatory Visit (INDEPENDENT_AMBULATORY_CARE_PROVIDER_SITE_OTHER): Payer: Medicare Other | Admitting: Pharmacist

## 2016-02-26 VITALS — BP 146/76 | HR 80 | Ht 64.5 in | Wt 229.0 lb

## 2016-02-26 DIAGNOSIS — E538 Deficiency of other specified B group vitamins: Secondary | ICD-10-CM | POA: Diagnosis not present

## 2016-02-26 DIAGNOSIS — M8580 Other specified disorders of bone density and structure, unspecified site: Secondary | ICD-10-CM

## 2016-02-26 MED ORDER — CALCIUM CARB-CHOLECALCIFEROL 500-200 MG-UNIT PO TABS: ORAL_TABLET | ORAL | Status: DC

## 2016-02-26 NOTE — Progress Notes (Signed)
Pt given Vit B12 inj Tolerated well 

## 2016-02-26 NOTE — Progress Notes (Signed)
Patient ID: Anna Dickerson, female   DOB: August 02, 1929, 83 y.o.   MRN: [id number]    CC: osteopenia  HPI: Patient has DEXA 11/2015 which showed osteopenia.  She has been referred by her PCP Dr Dettinger to review results and discuss treatment recommendations.  Back Pain?  Yes       Kyphosis?  Yes Prior fracture?  No Med(s) for Osteoporosis/Osteopenia:  none Med(s) previously tried for Osteoporosis/Osteopenia:  none                                                             PMH: Age at menopause:  83 yo Hysterectomy?  No Oophorectomy?  No HRT? Yes - Former.  Type/duration:   Steroid Use?  No Thyroid med?  No History of cancer?  Yes - breat cancer with 5 years of tamoxifen History of digestive disorders (ie Crohn's)?  No Current or previous eating disorders?  No Last Vitamin D Result:  Never checked Last GFR Result:  38 (12/03/2015)   FH/SH: Family history of osteoporosis?  No Parent with history of hip fracture?  No Family history of breast cancer?  No Exercise?  No Smoking?  No Alcohol?  No    Calcium Assessment Calcium Intake  # of servings/day  Calcium mg  Milk (8 oz) 0  x  300  = 0  Yogurt (4 oz) 0 x  200 = 0  Cheese (1 oz) 1 x  200 = 200mg   Other Calcium sources   250mg   Ca supplement 0 = 0   Estimated calcium intake per day 450mg     DEXA Results Date of Test T-Score for AP Spine L1-L2 T-Score for Neck Right Hip  12/03/2015 4.1 -1.4               FRAX 10 year estimate: Total FX risk:  12%  (consider medication if >/= 20%) Hip FX risk:  2.8%  (consider medication if >/= 3%)  Assessment: osteopenia  Recommendations: 1.   Discussed BMD  / DEXA results and discussed fracture risk. 2.  recommend calcium 1200mg  daily through supplementation or diet.  3.  recommend weight bearing exercise - 30 minutes at least 4 days per week.   4.  Counseled and educated about fall risk and prevention. 5.  Checking vitamin D level today.  Recheck DEXA:  2  years  Time spent counseling patient:  35 minutes

## 2016-02-26 NOTE — Patient Instructions (Signed)
Calcium & Vitamin D: The Facts  Why is calcium and vitamin D consumption important? Calcium: . Most Americans do not consume adequate amounts of calcium! Calcium is required for proper muscle function, nerve communication, bone support, and many other functions in the body.  . The body uses bones as a source of calcium. Bones 'remodel' themselves continuously - the body constantly breaks bone down to release calcium and rebuilds bones by replacing calcium in the bone later.  . As we get older, the rate of bone breakdown occurs faster than bone rebuilding which could lead to osteopenia, osteoporosis, and possible fractures.   Vitamin D: . People naturally make vitamin D in the body when sunlight hits the skin and triggers a process that leads to vitamin D production. This natural vitamin D production requires about 10-15 minutes of sun exposure on the hands, arms, and face at least 2-3 times per week. However, due to decreased sun exposure and the use of sunscreen, most people will need to get additional vitamin D from foods or supplements. Your doctor can measure your body's vitamin D level through a simple blood test to determine your daily vitamin D needs.  . Vitamin D is used to help the body absorb calcium, maintain bone health, help the immune system, and reduce inflammation. It also plays a role in muscle performance, balance and risk of falling.  . Vitamin D deficiency can lead to osteomalacia or softening of the bones, bone pain, and muscle weakness.   The recommended daily allowance of Calcium and Vitamin D varies for different age groups. Age group Calcium (mg) Vitamin D (IU)  Females and Males: Age 55-50 1000 mg 600 IU  Females: Age 28- 86 1200 mg 600 IU  Males: Age 30-70 1000 mg 600 IU  Females and Males: Age 54+ 1200 mg 800 IU  Pregnant/lactating Females age 82-50 1000 mg 600 IU   How much Calcium do you get in your diet? Calcium Intake # of servings per day  Total calcium (mg)   Skim milk, 2% milk (1 cup) _________ x 300 mg   Yogurt (1 small container) _________ x 200 mg   Cheese (1oz) _________ x 200 mg   Cottage Cheese (1 cup)             ________ x 150 mg   Almond milk (1 cup) _________ x 450 mg   Fortified Orange Juice (1 cup) _________ x 300 mg   Broccoli or spinach ( 1 cup) _________ x 100 mg   Salmon (3 oz) _________ x 150 mg    Almonds (1/4 cup) _______ x 90 mg      How do we get Calcium and Vitamin D in our diet? Calcium: . Obtaining calcium from the diet is the most preferred way to reach the recommended daily goal. If this goal is not reached through diet, calcium supplements are available.  . Calcium is found in many foods including: dairy products, dark leafy vegetables (like broccoli, kale, and spinach), fish, and fortified products like juices and cereals.  . The food label will have a %DV (percent daily value) listed showing the amount of calcium per serving. To determine the total mg per serving, simply replace the % with zero (0).  For example, Almond Breeze almond milk contains 45% DV of calcium or 4107m per 1 cup.  . You can increase the amount of calcium in your diet by using more calcium products in your daily meals. Use yogurt and fruit to  make smoothies or use yogurt to top baked potatoes or make whipped potatoes. Sprinkle low fat cheese onto salads or into egg white omelets. You can even add non-fat dry milk powder (300mg calcium per 1/3 cup) to hot cereals, meat loaf, soups, or potatoes.  . Calcium supplements come in many forms including tablets, chewables, and gummies. Be sure to read the label to determine the correct number of tablets per serving and whether or not to take the supplement with food.  . Calcium carbonate products (Oscal, Caltrate, and Viactiv) are generally better absorbed when taken with food while calcium citrate products like Citracal can be taken with or without food.  . The body can only absorb about 600 mg of calcium  at one time. It is recommended to take calcium supplements in small amounts several times per day.  However, taking it all at once is better than not taking it at all. . Increasing your intake of calcium is essential for bone health, but may also lead to some side effects like constipation, increased gas, bloating or abdominal cramping. To help reduce these side effects, start with 1 tablet per day and slowly increase your intake of the supplement to the recommended doses. It is also recommended that you drink plenty of water each day. Vitamin D: . Very few foods naturally contain vitamin D. However, it is found in saltwater fish (like tuna, salmon and mackerel), beef liver, egg yolks, cheese and vitamin D fortified foods (like yogurt, cereals, orange juice and milk) . The amount of vitamin D in each food or product is listed as %DV on the product label. To determine the total amount of vitamin D per serving, drop the % sign and multiply the number by 4. For example, 1 cup of Almond Breeze almond milk contains 25% DV vitamin D or 100 IU per serving (25 x 4 =100). . Vitamin D is also found in multivitamins and supplements and may be listed as ergocalciferol (vitamin D2) or cholecalciferol (vitamin D3). Each of these forms of vitamin D are equivalent and the daily recommended intake will vary based on your age and the vitamin D levels in your body. Follow your doctor's recommendation for vitamin D intake.                    Exercise for Strong Bones  Exercise is important to build and maintain strong bones / bone density.  There are 2 types of exercises that are important to building and maintaining strong bones:  Weight- bearing and muscle-stregthening.  Weight-bearing Exercises  These exercises include activities that make you move against gravity while staying upright. Weight-bearing exercises can be high-impact or low-impact.  High-impact weight-bearing exercises help build bones and keep them  strong. If you have broken a bone due to osteoporosis or are at risk of breaking a bone, you may need to avoid high-impact exercises. If you're not sure, you should check with your healthcare provider.  Examples of high-impact weight-bearing exercises are: Dancing  Doing high-impact aerobics  Hiking  Jogging/running  Jumping Rope  Stair climbing  Tennis  Low-impact weight-bearing exercises can also help keep bones strong and are a safe alternative if you cannot do high-impact exercises.   Examples of low-impact weight-bearing exercises are: Using elliptical training machines  Doing low-impact aerobics  Using stair-step machines  Fast walking on a treadmill or outside   Muscle-Strengthening Exercises These exercises include activities where you move your body, a weight or some other resistance   against gravity. They are also known as resistance exercises and include: Lifting weights  Using elastic exercise bands  Using weight machines  Lifting your own body weight  Functional movements, such as standing and rising up on your toes  Yoga and Pilates can also improve strength, balance and flexibility. However, certain positions may not be safe for people with osteoporosis or those at increased risk of broken bones. For example, exercises that have you bend forward may increase the chance of breaking a bone in the spine.   Non-Impact Exercises There are other types of exercises that can help prevent falls.  Non-impact exercises can help you to improve balance, posture and how well you move in everyday activities. Some of these exercises include: Balance exercises that strengthen your legs and test your balance, such as Tai Chi, can decrease your risk of falls.  Posture exercises that improve your posture and reduce rounded or "sloping" shoulders can help you decrease the chance of breaking a bone, especially in the spine.  Functional exercises that improve how well you move can help you  with everyday activities and decrease your chance of falling and breaking a bone. For example, if you have trouble getting up from a chair or climbing stairs, you should do these activities as exercises.   **A physical therapist can teach you balance, posture and functional exercises. He/she can also help you learn which exercises are safe and appropriate for you.  [facility] has a physical therapy office in [place] in front of our office and referrals can be made for assessments and treatment as needed and strength and balance training.  If you would like to have an assessment with [facility] and our physical therapy team please let a nurse or provider know.   Fall Prevention in the Home Falls can cause injuries and can affect people from all age groups. There are many simple things that you can do to make your home safe and to help prevent falls. What can I do on the outside of my home?  Regularly repair the edges of walkways and driveways and fix any cracks.  Remove high doorway thresholds.  Trim any shrubbery on the main path into your home.  Use bright outdoor lighting.  Clear walkways of debris and clutter, including tools and rocks.  Regularly check that handrails are securely fastened and in good repair. Both sides of any steps should have handrails.  Install guardrails along the edges of any raised decks or porches.  Have leaves, snow, and ice cleared regularly.  Use sand or salt on walkways during winter months.  In the garage, clean up any spills right away, including grease or oil spills. What can I do in the bathroom?  Use night lights.  Install grab bars by the toilet and in the tub and shower. Do not use towel bars as grab bars.  Use non-skid mats or decals on the floor of the tub or shower.  If you need to sit down while you are in the shower, use a plastic, non-slip stool.  Keep the floor dry. Immediately clean up any water that spills on the floor.  Remove soap  buildup in the tub or shower on a regular basis.  Attach bath mats securely with double-sided non-slip rug tape.  Remove throw rugs and other tripping hazards from the floor. What can I do in the bedroom?  Use night lights.  Make sure that a bedside light is easy to reach.  Do not use  oversized bedding that drapes onto the floor.  Have a firm chair that has side arms to use for getting dressed.  Remove throw rugs and other tripping hazards from the floor. What can I do in the kitchen?  Clean up any spills right away.  Avoid walking on wet floors.  Place frequently used items in easy-to-reach places.  If you need to reach for something above you, use a sturdy step stool that has a grab bar.  Keep electrical cables out of the way.  Do not use floor polish or wax that makes floors slippery. If you have to use wax, make sure that it is non-skid floor wax.  Remove throw rugs and other tripping hazards from the floor. What can I do in the stairways?  Do not leave any items on the stairs.  Make sure that there are handrails on both sides of the stairs. Fix handrails that are broken or loose. Make sure that handrails are as long as the stairways.  Check any carpeting to make sure that it is firmly attached to the stairs. Fix any carpet that is loose or worn.  Avoid having throw rugs at the top or bottom of stairways, or secure the rugs with carpet tape to prevent them from moving.  Make sure that you have a light switch at the top of the stairs and the bottom of the stairs. If you do not have them, have them installed. What are some other fall prevention tips?  Wear closed-toe shoes that fit well and support your feet. Wear shoes that have rubber soles or low heels.  When you use a stepladder, make sure that it is completely opened and that the sides are firmly locked. Have someone hold the ladder while you are using it. Do not climb a closed stepladder.  Add color or contrast  paint or tape to grab bars and handrails in your home. Place contrasting color strips on the first and last steps.  Use mobility aids as needed, such as canes, walkers, scooters, and crutches.  Turn on lights if it is dark. Replace any light bulbs that burn out.  Set up furniture so that there are clear paths. Keep the furniture in the same spot.  Fix any uneven floor surfaces.  Choose a carpet design that does not hide the edge of steps of a stairway.  Be aware of any and all pets.  Review your medicines with your healthcare provider. Some medicines can cause dizziness or changes in blood pressure, which increase your risk of falling. Talk with your health care provider about other ways that you can decrease your risk of falls. This may include working with a physical therapist or trainer to improve your strength, balance, and endurance. This information is not intended to replace advice given to you by your health care provider. Make sure you discuss any questions you have with your health care provider. Document Released: 12/20/2001 Document Revised: 05/29/2015 Document Reviewed: 02/03/2014 Elsevier Interactive Patient Education  2017 [employer]  

## 2016-02-27 ENCOUNTER — Other Ambulatory Visit: Payer: Self-pay | Admitting: Pharmacist

## 2016-02-27 LAB — VITAMIN D 25 HYDROXY (VIT D DEFICIENCY, FRACTURES): Vit D, 25-Hydroxy: 8.8 ng/mL — ABNORMAL LOW (ref 30.0–100.0)

## 2016-02-27 MED ORDER — VITAMIN D (ERGOCALCIFEROL) 1.25 MG (50000 UNIT) PO CAPS: 50000.0000 [IU] | ORAL_CAPSULE | ORAL | 0 refills | Status: DC

## 2016-03-04 ENCOUNTER — Encounter: Payer: Self-pay | Admitting: Family Medicine

## 2016-03-04 ENCOUNTER — Ambulatory Visit (INDEPENDENT_AMBULATORY_CARE_PROVIDER_SITE_OTHER): Payer: Medicare Other | Admitting: Family Medicine

## 2016-03-04 VITALS — BP 158/91 | HR 106 | Temp 96.8°F | Ht 65.0 in | Wt 223.8 lb

## 2016-03-04 DIAGNOSIS — F418 Other specified anxiety disorders: Secondary | ICD-10-CM | POA: Diagnosis not present

## 2016-03-04 DIAGNOSIS — F329 Major depressive disorder, single episode, unspecified: Secondary | ICD-10-CM

## 2016-03-04 DIAGNOSIS — I10 Essential (primary) hypertension: Secondary | ICD-10-CM

## 2016-03-04 DIAGNOSIS — E1139 Type 2 diabetes mellitus with other diabetic ophthalmic complication: Secondary | ICD-10-CM

## 2016-03-04 DIAGNOSIS — F419 Anxiety disorder, unspecified: Secondary | ICD-10-CM

## 2016-03-04 DIAGNOSIS — F32A Depression, unspecified: Secondary | ICD-10-CM

## 2016-03-04 LAB — BAYER DCA HB A1C WAIVED: HB A1C: 6.7 % (ref <7.0)

## 2016-03-04 MED ORDER — MELATONIN 5 MG PO TABS: 1.0000 | ORAL_TABLET | Freq: Every day | ORAL | 6 refills | Status: DC

## 2016-03-04 MED ORDER — VENLAFAXINE HCL ER 150 MG PO CP24: 150.0000 mg | ORAL_CAPSULE | Freq: Every day | ORAL | 2 refills | Status: DC

## 2016-03-04 NOTE — Progress Notes (Signed)
BP (!) 158/91 (BP Location: Left Arm)   Pulse (!) 106   Temp (!) 96.8 F (36 C) (Oral)   Ht 5\' 5"  (1.651 m)   Wt 223 lb 12.8 oz (101.5 kg)   BMI 37.24 kg/m    Subjective:    Patient ID: Anna Dickerson, female    DOB: [birth year], 83 y.o.   MRN: [id number]  HPI: Anna Dickerson is a 83 y.o. female presenting on 03/04/2016 for check up diabetic (doesnt sleep well)   HPI Diabetes recheck Patient is coming in for a type 2 diabetes recheck today. She says her blood sugar this morning was 120 and has been running about there. She denies any significantly elevated blood sugars. She denies any new issues with her feet. She does have known vision troubles because of the diabetes and is following up with an ophthalmologist for that. She is currently on an angiotensin receptor blocker.  Hypertension recheck Patient's blood pressure is elevated at 158/91 today. She says that she is taking her medications appropriately and didn't take them this morning. Patient denies headaches, blurred vision, chest pains, shortness of breath, or weakness. Denies any side effects from medication and is content with current medication.   Anxiety and depression follow-up She is coming in today for a follow-up on her anxiety and depression. She says she's been having a lot more issues with anxiety. She has been on Effexor 75 mg but does not feel like it's working and has needed to use more of her anxiety medication recently. She also says she's been having a lot more difficulty with sleep at night and both maintaining sleep and initiating sleep. She has not really used any sleep aids except Tylenol nighttime for this. She says she gets to where her anxiety and feelings of depression buildup throughout the day and feels like she's needed more alprazolam and is currently taking it once a day every day.  Relevant past medical, surgical, family and social history reviewed and updated as indicated. Interim medical  history since our last visit reviewed. Allergies and medications reviewed and updated.  Review of Systems  Constitutional: Negative for chills and fever.  HENT: Negative for congestion, ear discharge and ear pain.   Eyes: Negative for redness and visual disturbance.  Respiratory: Negative for chest tightness and shortness of breath.   Cardiovascular: Negative for chest pain and leg swelling.  Genitourinary: Negative for difficulty urinating and dysuria.  Musculoskeletal: Negative for back pain and gait problem.  Skin: Negative for rash.  Neurological: Negative for light-headedness and headaches.  Psychiatric/Behavioral: Positive for decreased concentration, dysphoric mood and sleep disturbance. Negative for agitation, behavioral problems, self-injury and suicidal ideas. The patient is nervous/anxious.   All other systems reviewed and are negative.   Per HPI unless specifically indicated above     Objective:    BP (!) 158/91 (BP Location: Left Arm)   Pulse (!) 106   Temp (!) 96.8 F (36 C) (Oral)   Ht 5\' 5"  (1.651 m)   Wt 223 lb 12.8 oz (101.5 kg)   BMI 37.24 kg/m   Wt Readings from Last 3 Encounters:  03/04/16 223 lb 12.8 oz (101.5 kg)  02/26/16 229 lb (103.9 kg)  02/06/16 229 lb (103.9 kg)    Physical Exam  Constitutional: She is oriented to person, place, and time. She appears well-developed and well-nourished. No distress.  Eyes: Conjunctivae are normal.  Neck: Neck supple. No thyromegaly present.  Cardiovascular: Normal rate, regular rhythm, normal  heart sounds and intact distal pulses.   No murmur heard. Pulmonary/Chest: Effort normal and breath sounds normal. No respiratory distress. She has no wheezes. She has no rales.  Musculoskeletal: Normal range of motion. She exhibits no edema or tenderness.  Lymphadenopathy:    She has no cervical adenopathy.  Neurological: She is alert and oriented to person, place, and time. Coordination normal.  Skin: Skin is warm and  dry. No rash noted. She is not diaphoretic.  Psychiatric: Her behavior is normal. Her mood appears anxious. She exhibits a depressed mood. She expresses no suicidal ideation. She expresses no suicidal plans.  Nursing note and vitals reviewed.       Assessment & Plan:   Problem List Items Addressed This Visit      Cardiovascular and Mediastinum   HTN (hypertension) - Primary    Blood pressure is elevated, will return for recheck in 4 weeks        Endocrine   DM (diabetes mellitus) ([place])    Had 120 at home this morning, seems to be doing well, check A1c, return in 3 months      Relevant Orders   Bayer DCA Hb A1c Waived     Other   Anxiety and depression    Been having a lot of issues with this, we will increase Effexor, try not to take too much alprazolam      Relevant Medications   venlafaxine XR (EFFEXOR-XR) 150 MG 24 hr capsule   Melatonin 5 MG TABS       Follow up plan: Return in about 4 weeks (around 04/01/2016), or if symptoms worsen or fail to improve, for Hypertension recheck.  Counseling provided for all of the vaccine components Orders Placed This Encounter  Procedures  . [facility] Hb A1c [place], MD [facility] 03/04/2016, 1:59 PM

## 2016-03-04 NOTE — Assessment & Plan Note (Signed)
Had 120 at home this morning, seems to be doing well, check A1c, return in 3 months

## 2016-03-04 NOTE — Assessment & Plan Note (Signed)
Been having a lot of issues with this, we will increase Effexor, try not to take too much alprazolam

## 2016-03-04 NOTE — Assessment & Plan Note (Signed)
Blood pressure is elevated, will return for recheck in 4 weeks

## 2016-03-06 NOTE — Progress Notes (Signed)
Patient aware.

## 2016-03-19 ENCOUNTER — Other Ambulatory Visit: Payer: Self-pay | Admitting: Family Medicine

## 2016-03-19 NOTE — Telephone Encounter (Signed)
Go ahead and call in a refill for her.

## 2016-03-28 ENCOUNTER — Ambulatory Visit: Payer: Medicare Other | Admitting: Family Medicine

## 2016-03-28 ENCOUNTER — Ambulatory Visit (INDEPENDENT_AMBULATORY_CARE_PROVIDER_SITE_OTHER): Payer: Medicare Other | Admitting: *Deleted

## 2016-03-28 DIAGNOSIS — E538 Deficiency of other specified B group vitamins: Secondary | ICD-10-CM | POA: Diagnosis not present

## 2016-03-28 NOTE — Progress Notes (Signed)
Pt given Vit B12 inj Tolerated well 

## 2016-03-29 ENCOUNTER — Other Ambulatory Visit: Payer: Self-pay | Admitting: Family Medicine

## 2016-03-29 DIAGNOSIS — F419 Anxiety disorder, unspecified: Principal | ICD-10-CM

## 2016-03-29 DIAGNOSIS — F329 Major depressive disorder, single episode, unspecified: Secondary | ICD-10-CM

## 2016-03-31 ENCOUNTER — Ambulatory Visit: Payer: Medicare Other | Admitting: Family Medicine

## 2016-03-31 ENCOUNTER — Encounter: Payer: Self-pay | Admitting: Family Medicine

## 2016-03-31 VITALS — BP 157/91 | HR 99 | Temp 98.1°F | Ht 65.0 in | Wt 222.0 lb

## 2016-03-31 DIAGNOSIS — I10 Essential (primary) hypertension: Secondary | ICD-10-CM | POA: Diagnosis not present

## 2016-03-31 MED ORDER — MIRABEGRON ER 25 MG PO TB24: 25.0000 mg | ORAL_TABLET | Freq: Every day | ORAL | 2 refills | Status: DC

## 2016-03-31 NOTE — Progress Notes (Signed)
BP (!) 157/91   Pulse 99   Temp 98.1 F (36.7 C) (Oral)   Ht 5\' 5"  (1.651 m)   Wt 222 lb (100.7 kg)   BMI 36.94 kg/m    Subjective:    Patient ID: Anna Dickerson, female    DOB: 1929-08-28, 83 y.o.   MRN: [id number]  HPI: Anna Dickerson is a 83 y.o. female presenting on 03/31/2016 for Hypertension (one month followup)   HPI Hypertension recheck She was coming in for hypertension recheck today. Her blood pressure today on the second check was 157/91. She is artery on multiple agents and is seen other specialists such as a nephrologist. We will not change anything today because there is not much more to add except that we will add a medication for overactive bladder to see if it will help with blood pressure some. But also help with her bladder issues. We will try Myrbetriq. Patient denies headaches, blurred vision, chest pains, shortness of breath, or weakness. Denies any side effects from medication and is content with current medication.   Relevant past medical, surgical, family and social history reviewed and updated as indicated. Interim medical history since our last visit reviewed. Allergies and medications reviewed and updated.  Review of Systems  Constitutional: Negative for chills and fever.  HENT: Negative for congestion, ear discharge and ear pain.   Respiratory: Negative for chest tightness and shortness of breath.   Cardiovascular: Negative for chest pain and leg swelling.  Gastrointestinal: Negative for abdominal pain.  Genitourinary: Positive for frequency and urgency. Negative for difficulty urinating and dysuria.  Musculoskeletal: Negative for back pain and gait problem.  Skin: Negative for rash.  Neurological: Negative for light-headedness and headaches.  Psychiatric/Behavioral: Negative for agitation and behavioral problems.  All other systems reviewed and are negative.   Per HPI unless specifically indicated above   Allergies as of 03/31/2016    Reactions   Sulfa Antibiotics    Mouth ulcers      Medication List       Accurate as of 03/31/16 10:22 AM. Always use your most recent med list.          allopurinol 100 MG tablet Commonly known as:  ZYLOPRIM Take 1 tablet (100 mg total) by mouth daily. 1 tablet   amLODipine-olmesartan 10-40 MG tablet Commonly known as:  AZOR TAKE 1 TABLET BY MOUTH DAILY.   aspirin 81 MG tablet Take 81 mg by mouth daily.   atorvastatin 20 MG tablet Commonly known as:  LIPITOR Take 1 tablet (20 mg total) by mouth at bedtime. 1 tablet   Calcium Carb-Cholecalciferol 500-200 MG-UNIT Tabs Commonly known as:  CALCIUM 500 + D Take 1 tablet daily   cloNIDine 0.1 MG tablet Commonly known as:  CATAPRES Take 1 tablet (0.1 mg total) by mouth 2 (two) times daily. 1 tablet   fluticasone 50 MCG/ACT nasal spray Commonly known as:  FLONASE Place 1 spray into both nostrils 2 (two) times daily as needed for allergies or rhinitis.   hydrochlorothiazide 12.5 MG capsule Commonly known as:  MICROZIDE TAKE 1 CAPSULE (12.5 MG TOTAL) BY MOUTH DAILY.   LORazepam 0.5 MG tablet Commonly known as:  ATIVAN TAKE 1 TABLET BY MOUTH TWICE A DAY AS NEEDED   Melatonin 5 MG Tabs Take 1 tablet (5 mg total) by mouth at bedtime.   metFORMIN 1000 MG tablet Commonly known as:  GLUCOPHAGE Take 1,000 mg by mouth 2 (two) times daily with a meal.   metoprolol  50 MG tablet Commonly known as:  LOPRESSOR Take 1 tablet (50 mg total) by mouth 2 (two) times daily.   mirabegron ER 25 MG Tb24 tablet Commonly known as:  MYRBETRIQ Take 1 tablet (25 mg total) by mouth daily.   PRESERVISION AREDS 2 Caps Take 2 capsules by mouth 2 (two) times daily.   TEKTURNA 300 MG tablet Generic drug:  aliskiren TAKE 1 TABLET (300 MG TOTAL) BY MOUTH DAILY.   triamcinolone cream 0.1 % Commonly known as:  KENALOG Apply 1 application topically 2 (two) times daily.   venlafaxine XR 150 MG 24 hr capsule Commonly known as:   EFFEXOR-XR Take 1 capsule (150 mg total) by mouth daily with breakfast.   venlafaxine 37.5 MG tablet Commonly known as:  EFFEXOR TAKE 1 TABLET (37.5 MG TOTAL) BY MOUTH 2 (TWO) TIMES DAILY.   Vitamin D (Ergocalciferol) 50000 units Caps capsule Commonly known as:  DRISDOL Take 1 capsule (50,000 Units total) by mouth every 7 (seven) days.          Objective:    BP (!) 157/91   Pulse 99   Temp 98.1 F (36.7 C) (Oral)   Ht 5\' 5"  (1.651 m)   Wt 222 lb (100.7 kg)   BMI 36.94 kg/m   Wt Readings from Last 3 Encounters:  03/31/16 222 lb (100.7 kg)  03/04/16 223 lb 12.8 oz (101.5 kg)  02/26/16 229 lb (103.9 kg)    Physical Exam  Constitutional: She is oriented to person, place, and time. She appears well-developed and well-nourished. No distress.  Eyes: Conjunctivae are normal.  Neck: Neck supple.  Cardiovascular: Normal rate, regular rhythm, normal heart sounds and intact distal pulses.   No murmur heard. Pulmonary/Chest: Effort normal and breath sounds normal. No respiratory distress. She has no wheezes.  Abdominal: Soft. Bowel sounds are normal. She exhibits no distension. There is no tenderness. There is no rebound and no guarding.  Musculoskeletal: Normal range of motion. She exhibits no edema or tenderness.  Lymphadenopathy:    She has no cervical adenopathy.  Neurological: She is alert and oriented to person, place, and time. Coordination normal.  Skin: Skin is warm and dry. No rash noted. She is not diaphoretic.  Psychiatric: She has a normal mood and affect. Her behavior is normal.  Nursing note and vitals reviewed.     Assessment & Plan:   Problem List Items Addressed This Visit      Cardiovascular and Mediastinum   HTN (hypertension) - Primary   Relevant Medications   mirabegron ER (MYRBETRIQ) 25 MG TB24 tablet       Follow up plan: Return in about 2 months (around 05/31/2016), or if symptoms worsen or fail to improve, for Hypertension and diabetes  recheck.  Counseling provided for all of the vaccine components No orders of the defined types were placed in this encounter.   Caryl Pina, MD [facility] 03/31/2016, 10:22 AM

## 2016-04-24 ENCOUNTER — Other Ambulatory Visit: Payer: Self-pay | Admitting: Pharmacist

## 2016-04-29 ENCOUNTER — Ambulatory Visit (INDEPENDENT_AMBULATORY_CARE_PROVIDER_SITE_OTHER): Payer: Medicare Other | Admitting: *Deleted

## 2016-04-29 DIAGNOSIS — E538 Deficiency of other specified B group vitamins: Secondary | ICD-10-CM | POA: Diagnosis not present

## 2016-04-29 NOTE — Progress Notes (Signed)
Pt given Vit B12 inj Tolerated well 

## 2016-05-07 ENCOUNTER — Other Ambulatory Visit: Payer: Self-pay | Admitting: Family Medicine

## 2016-05-07 DIAGNOSIS — E785 Hyperlipidemia, unspecified: Secondary | ICD-10-CM

## 2016-05-08 ENCOUNTER — Other Ambulatory Visit: Payer: Self-pay | Admitting: Family Medicine

## 2016-05-08 DIAGNOSIS — I10 Essential (primary) hypertension: Secondary | ICD-10-CM

## 2016-05-13 ENCOUNTER — Ambulatory Visit (INDEPENDENT_AMBULATORY_CARE_PROVIDER_SITE_OTHER): Payer: Medicare Other | Admitting: Pharmacist

## 2016-05-13 ENCOUNTER — Encounter: Payer: Self-pay | Admitting: Pharmacist

## 2016-05-13 VITALS — BP 152/88 | HR 75 | Ht 65.0 in | Wt 222.5 lb

## 2016-05-13 DIAGNOSIS — N183 Chronic kidney disease, stage 3 unspecified: Secondary | ICD-10-CM

## 2016-05-13 DIAGNOSIS — I10 Essential (primary) hypertension: Secondary | ICD-10-CM

## 2016-05-13 DIAGNOSIS — Z23 Encounter for immunization: Secondary | ICD-10-CM

## 2016-05-13 DIAGNOSIS — R7989 Other specified abnormal findings of blood chemistry: Secondary | ICD-10-CM

## 2016-05-13 DIAGNOSIS — Z0000 Encounter for general adult medical examination without abnormal findings: Secondary | ICD-10-CM | POA: Diagnosis not present

## 2016-05-13 DIAGNOSIS — E1122 Type 2 diabetes mellitus with diabetic chronic kidney disease: Secondary | ICD-10-CM

## 2016-05-13 MED ORDER — FUROSEMIDE 20 MG PO TABS
20.0000 mg | ORAL_TABLET | Freq: Every day | ORAL | 3 refills | Status: DC
Start: 2016-05-13 — End: 2016-06-06

## 2016-05-13 NOTE — Patient Instructions (Addendum)
Anna Dickerson , Thank you for taking time to come for your Medicare Wellness Visit. I appreciate your ongoing commitment to your health goals. Please review the following plan we discussed and let me know if I can assist you in the future.   These are the goals we discussed:  Ok to take tylenol 500mg  up to 3 per day as needed for pain  Stop Tekturna Stop hydrochlorothiazide  Start furosemide 20mg  - take 1 tablet daily in the morning (may take an extra tablet at noon if needed for swelling)  Start chair exercises - try to do 1 or 2 times every day.   Limit salt intake - see dietary recommendations below.     This is a list of the screening recommended for you and due dates:  Health Maintenance  Topic Date Due  . Eye exam for diabetics  04/02/2016 - Done by Dr Baird Cancer, requesting records  . Tetanus Vaccine  10/01/2016 - done todya  . Pneumonia vaccines (1 of 2 - PCV13) 10/01/2016* - checking old records  . Complete foot exam   08/26/2016  . Hemoglobin A1C  09/01/2016  . DEXA scan (bone density measurement)  02/04/2018  *Topic was postponed. The date shown is not the original due date.    DASH Eating Plan DASH stands for "Dietary Approaches to Stop Hypertension." The DASH eating plan is a healthy eating plan that has been shown to reduce high blood pressure (hypertension). It may also reduce your risk for type 2 diabetes, heart disease, and stroke. The DASH eating plan may also help with weight loss. What are tips for following this plan? General guidelines   Avoid eating more than 2,300 mg (milligrams) of salt (sodium) a day. If you have hypertension, you may need to reduce your sodium intake to 1,500 mg a day.  Limit alcohol intake to no more than 1 drink a day for nonpregnant women and 2 drinks a day for men. One drink equals 12 oz of beer, 5 oz of wine, or 1 oz of hard liquor.  Work with your health care provider to maintain a healthy body weight or to lose weight. Ask what an  ideal weight is for you.  Get at least 30 minutes of exercise that causes your heart to beat faster (aerobic exercise) most days of the week. Activities may include walking, swimming, or biking.  Work with your health care provider or diet and nutrition specialist (dietitian) to adjust your eating plan to your individual calorie needs. Reading food labels   Check food labels for the amount of sodium per serving. Choose foods with less than 5 percent of the Daily Value of sodium. Generally, foods with less than 300 mg of sodium per serving fit into this eating plan.  To find whole grains, look for the word "whole" as the first word in the ingredient list. Shopping   Buy products labeled as "low-sodium" or "no salt added."  Buy fresh foods. Avoid canned foods and premade or frozen meals. Cooking   Avoid adding salt when cooking. Use salt-free seasonings or herbs instead of table salt or sea salt. Check with your health care provider or pharmacist before using salt substitutes.  Do not fry foods. Cook foods using healthy methods such as baking, boiling, grilling, and broiling instead.  Cook with heart-healthy oils, such as olive, canola, soybean, or sunflower oil. Meal planning    Eat a balanced diet that includes:  5 or more servings of fruits and vegetables each  day. At each meal, try to fill half of your plate with fruits and vegetables.  Up to 6-8 servings of whole grains each day.  Less than 6 oz of lean meat, poultry, or fish each day. A 3-oz serving of meat is about the same size as a deck of cards. One egg equals 1 oz.  2 servings of low-fat dairy each day.  A serving of nuts, seeds, or beans 5 times each week.  Heart-healthy fats. Healthy fats called Omega-3 fatty acids are found in foods such as flaxseeds and coldwater fish, like sardines, salmon, and mackerel.  Limit how much you eat of the following:  Canned or prepackaged foods.  Food that is high in trans fat,  such as fried foods.  Food that is high in saturated fat, such as fatty meat.  Sweets, desserts, sugary drinks, and other foods with added sugar.  Full-fat dairy products.  Do not salt foods before eating.  Try to eat at least 2 vegetarian meals each week.  Eat more home-cooked food and less restaurant, buffet, and fast food.  When eating at a restaurant, ask that your food be prepared with less salt or no salt, if possible. What foods are recommended? The items listed may not be a complete list. Talk with your dietitian about what dietary choices are best for you. Grains  Whole-grain or whole-wheat bread. Whole-grain or whole-wheat pasta. Brown rice. [place] Morrow. Bulgur. Whole-grain and low-sodium cereals. Pita bread. Low-fat, low-sodium crackers. Whole-wheat flour tortillas. Vegetables  Fresh or frozen vegetables (raw, steamed, roasted, or grilled). Low-sodium or reduced-sodium tomato and vegetable juice. Low-sodium or reduced-sodium tomato sauce and tomato paste. Low-sodium or reduced-sodium canned vegetables. Fruits  All fresh, dried, or frozen fruit. Canned fruit in natural juice (without added sugar). Meat and other protein foods  Skinless chicken or [place]. Ground chicken or [place]. Pork with fat trimmed off. Fish and seafood. Egg whites. Dried beans, peas, or lentils. Unsalted nuts, nut butters, and seeds. Unsalted canned beans. Lean cuts of beef with fat trimmed off. Low-sodium, lean deli meat. Dairy  Low-fat (1%) or fat-free (skim) milk. Fat-free, low-fat, or reduced-fat cheeses. Nonfat, low-sodium ricotta or cottage cheese. Low-fat or nonfat yogurt. Low-fat, low-sodium cheese. Fats and oils  Soft margarine without trans fats. Vegetable oil. Low-fat, reduced-fat, or light mayonnaise and salad dressings (reduced-sodium). Canola, safflower, olive, soybean, and sunflower oils. Avocado. Seasoning and other foods  Herbs. Spices. Seasoning mixes without salt. Unsalted popcorn  and pretzels. Fat-free sweets. What foods are not recommended? The items listed may not be a complete list. Talk with your dietitian about what dietary choices are best for you. Grains  Baked goods made with fat, such as croissants, muffins, or some breads. Dry pasta or rice meal packs. Vegetables  Creamed or fried vegetables. Vegetables in a cheese sauce. Regular canned vegetables (not low-sodium or reduced-sodium). Regular canned tomato sauce and paste (not low-sodium or reduced-sodium). Regular tomato and vegetable juice (not low-sodium or reduced-sodium). Angie Fava. Olives. Fruits  Canned fruit in a light or heavy syrup. Fried fruit. Fruit in cream or butter sauce. Meat and other protein foods  Fatty cuts of meat. Ribs. Fried meat. Berniece Salines. Sausage. Bologna and other processed lunch meats. Salami. Fatback. Hotdogs. Bratwurst. Salted nuts and seeds. Canned beans with added salt. Canned or smoked fish. Whole eggs or egg yolks. Chicken or [place] with skin. Dairy  Whole or 2% milk, cream, and half-and-half. Whole or full-fat cream cheese. Whole-fat or sweetened yogurt. Full-fat cheese. Nondairy creamers.  Whipped toppings. Processed cheese and cheese spreads. Fats and oils  Butter. Stick margarine. Lard. Shortening. Ghee. Bacon fat. Tropical oils, such as coconut, palm kernel, or palm oil. Seasoning and other foods  Salted popcorn and pretzels. Onion salt, garlic salt, seasoned salt, table salt, and sea salt. Worcestershire sauce. Tartar sauce. Barbecue sauce. Teriyaki sauce. Soy sauce, including reduced-sodium. Steak sauce. Canned and packaged gravies. Fish sauce. Oyster sauce. Cocktail sauce. Horseradish that you find on the shelf. Ketchup. Mustard. Meat flavorings and tenderizers. Bouillon cubes. Hot sauce and Tabasco sauce. Premade or packaged marinades. Premade or packaged taco seasonings. Relishes. Regular salad dressings. Where to find more information:  National Heart, Lung, and [place]: https://wilson-eaton.com/  American Heart Association: www.heart.org Summary  The DASH eating plan is a healthy eating plan that has been shown to reduce high blood pressure (hypertension). It may also reduce your risk for type 2 diabetes, heart disease, and stroke.  With the DASH eating plan, you should limit salt (sodium) intake to 2,300 mg a day. If you have hypertension, you may need to reduce your sodium intake to 1,500 mg a day.  When on the DASH eating plan, aim to eat more fresh fruits and vegetables, whole grains, lean proteins, low-fat dairy, and heart-healthy fats.  Work with your health care provider or diet and nutrition specialist (dietitian) to adjust your eating plan to your individual calorie needs. This information is not intended to replace advice given to you by your health care provider. Make sure you discuss any questions you have with your health care provider. Document Released: 12/19/2010 Document Revised: 12/24/2015 Document Reviewed: 12/24/2015 Elsevier Interactive Patient Education  2017 [employer].

## 2016-05-13 NOTE — Progress Notes (Signed)
Patient ID: Anna Dickerson, female   DOB: [birth year], 83 y.o.   MRN: [id number]     Subjective:   Anna Dickerson is a 83 y.o. female who presents for an Initial Medicare Annual Wellness Visit.  Social History: Born/Raised: [place], [place] Occupational history: Currently retired.  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 Marital history: widowed, was married to husband for 65 years. Patient currently lives alone.  She has 4 adult children who are very supportive. 3 live locally and 1 child lives in [place].   Health Concerns: Anna Dickerson is concerned that her BP medications is no longer working though she has a long history of poorly controlled BP.   She also asks if she can take APAP for occasionally joint pain.    Current Medications (verified) Outpatient Encounter Prescriptions as of 05/13/2016  Medication Sig  . allopurinol (ZYLOPRIM) 100 MG tablet Take 1 tablet (100 mg total) by mouth daily. 1 tablet  . amLODipine-olmesartan (AZOR) 10-40 MG tablet TAKE 1 TABLET BY MOUTH DAILY.  Marland Kitchen aspirin 81 MG tablet Take 81 mg by mouth daily.    Marland Kitchen atorvastatin (LIPITOR) 20 MG tablet TAKE 1 TABLET (20 MG TOTAL) BY MOUTH AT BEDTIME.  . Calcium Carb-Cholecalciferol (CALCIUM 500 + D) 500-200 MG-UNIT TABS Take 1 tablet daily  . cloNIDine (CATAPRES) 0.1 MG tablet Take 1 tablet (0.1 mg total) by mouth 2 (two) times daily. 1 tablet  . fluticasone (FLONASE) 50 MCG/ACT nasal spray Place 1 spray into both nostrils 2 (two) times daily as needed for allergies or rhinitis.  Marland Kitchen LORazepam (ATIVAN) 0.5 MG tablet TAKE 1 TABLET BY MOUTH TWICE A DAY AS NEEDED  . Melatonin 5 MG TABS Take 1 tablet (5 mg total) by mouth at bedtime.  . metFORMIN (GLUCOPHAGE) 1000 MG tablet Take 1,000 mg by mouth 2 (two) times daily with a meal.  . metoprolol (LOPRESSOR) 50 MG tablet Take 1 tablet (50 mg total) by mouth 2 (two) times daily.  . mirabegron ER  (MYRBETRIQ) 25 MG TB24 tablet Take 1 tablet (25 mg total) by mouth daily.  . Multiple Vitamins-Minerals (PRESERVISION AREDS 2) CAPS Take 2 capsules by mouth 2 (two) times daily.  Marland Kitchen triamcinolone cream (KENALOG) 0.1 % Apply 1 application topically 2 (two) times daily.  Marland Kitchen venlafaxine XR (EFFEXOR-XR) 150 MG 24 hr capsule Take 1 capsule (150 mg total) by mouth daily with breakfast.  . Vitamin D, Ergocalciferol, (DRISDOL) 50000 units CAPS capsule TAKE 1 CAPSULE (50,000 UNITS TOTAL) BY MOUTH EVERY 7 (SEVEN) DAYS.  . [DISCONTINUED] hydrochlorothiazide (MICROZIDE) 12.5 MG capsule TAKE 1 CAPSULE (12.5 MG TOTAL) BY MOUTH DAILY.  . [DISCONTINUED] TEKTURNA 300 MG tablet TAKE 1 TABLET (300 MG TOTAL) BY MOUTH DAILY.  . furosemide (LASIX) 20 MG tablet Take 1 tablet (20 mg total) by mouth daily.  . [DISCONTINUED] venlafaxine (EFFEXOR) 37.5 MG tablet TAKE 1 TABLET (37.5 MG TOTAL) BY MOUTH 2 (TWO) TIMES DAILY. (Patient not taking: Reported on 05/13/2016)   Facility-Administered Encounter Medications as of 05/13/2016  Medication  . cyanocobalamin ((VITAMIN B-12)) injection 1,000 mcg    Allergies (verified) Sulfa antibiotics   History: Past Medical History:  Diagnosis Date  . B12 deficiency 02/08/2015  . Cancer [facility]) [id number]   breast  . Diabetes mellitus   . DM (diabetes mellitus) ([place]) 12/31/2010  . HTN (hypertension) 12/31/2010  . Hypertension   . Iron deficiency 02/24/2012  . Macular degeneration 12/31/2010  .  Macular degeneration, left eye   . OAB (overactive bladder)   . Obesity 12/31/2010  . Stage I left-sided Breast cancer 12/31/2010   Past Surgical History:  Procedure Laterality Date  . bilateral cataract with intraocular lens implant    . Bladder tacked-up    . BREAST SURGERY  left    . REPLACEMENT TOTAL KNEE BILATERAL Bilateral    TKR were ar different times about 2 yeras apart.   Family History  Problem Relation Age of Onset  . Cancer Mother    Social History   Occupational  History  . Not on file.   Social History Main Topics  . Smoking status: Never Smoker  . Smokeless tobacco: Never Used  . Alcohol use No  . Drug use: No  . Sexual activity: No    Do you feel safe at home?  Yes Are there smokers in your home (other than you)? No  Dietary issues and exercise activities: Current Exercise Habits: The patient does not participate in regular exercise at present, Exercise limited by: orthopedic condition(s)  Current Dietary habits:  States that she limit sodium intake but eats out at least once a day.  She also tries to limit sweets and CHO intake due to diabetes.    Objective:    Today's Vitals   05/13/16 1142 05/13/16 1209  BP: (!) 168/84 (!) 152/88  Pulse: 75   Weight: 222 lb 8 oz (100.9 kg)   Height: 5\' 5"  (1.651 m)   PainSc: 0-No pain    Body mass index is 37.03 kg/m.  Activities of Daily Living In your present state of health, do you have any difficulty performing the following activities: 05/13/2016  Hearing? Y  Vision? Y  Difficulty concentrating or making decisions? N  Walking or climbing stairs? N  Dressing or bathing? N  Doing errands, shopping? N  Preparing Food and eating ? N  Using the Toilet? N  In the past six months, have you accidently leaked urine? Y  Do you have problems with loss of bowel control? N  Managing your Medications? N  Managing your Finances? N  Housekeeping or managing your Housekeeping? N  Some recent data might be hidden     Cardiac Risk Factors include: advanced age (>45men, >73 women);diabetes mellitus;dyslipidemia;hypertension;microalbuminuria;obesity (BMI >30kg/m2);sedentary lifestyle  Depression Screen PHQ 2/9 Scores 05/13/2016 03/31/2016 03/04/2016 12/12/2015  PHQ - 2 Score 0 2 0 0  PHQ- 9 Score 3 4 - -     Fall Risk Fall Risk  05/13/2016 03/31/2016 03/04/2016 12/12/2015 09/24/2015  Falls in the past year? Yes Yes Yes No Yes  Number falls in past yr: 2 or more 1 1 - 1  Injury with Fall? No No No - No   Risk Factor Category  High Fall Risk - - - -  Risk for fall due to : History of fall(s);Impaired mobility - - - -  Follow up Falls prevention discussed - - - -   **patient wears safety alert necklace in case of falls / emergency  Cognitive Function: MMSE - Mini Mental State Exam 05/13/2016  Orientation to time 4  Orientation to Place 5  Registration 3  Attention/ Calculation 5  Recall 2  Language- name 2 objects 2  Language- repeat 1  Language- follow 3 step command 3  Language- read & follow direction 1  Write a sentence 1  Copy design 1  Total score 28    Immunizations and Health Maintenance Immunization History  Administered Date(s) Administered  .  Influenza,inj,Quad PF,36+ Mos 02/01/2014, 12/03/2015  . Tdap 05/13/2016  . Zoster Recombinat (Shingrix) 05/13/2016   Health Maintenance Due  Topic Date Due  . OPHTHALMOLOGY EXAM  04/02/2016    Patient Care Team: [name], MD as PCP - General (Family Medicine) Sherlynn Stalls, MD as Consulting Physician (Ophthalmology) Rolm Bookbinder, MD as Consulting Physician (Dermatology)  Indicate any recent Medical Services you may have received from other than [facility] providers in the past year (date may be approximate).    Assessment:    Annual Wellness Visit  Uncontrolled HTN Medication management Low vitamin D   Screening Tests Health Maintenance  Topic Date Due  . OPHTHALMOLOGY EXAM  04/02/2016  . TETANUS/TDAP  10/01/2016 (Originally 12/06/1948)  . PNA vac Low Risk Adult (1 of 2 - PCV13) 10/01/2016 (Originally 12/05/1996)  . FOOT EXAM  08/26/2016  . HEMOGLOBIN A1C  09/01/2016  . DEXA SCAN  02/04/2018        Plan:   During the course of the visit Anna Dickerson was educated and counseled about the following appropriate screening and preventive services:   Vaccines to include Pneumoccal, Influenza, Td and Shingles - patient received shingric and boostrix in office today. We will try to locate previous PCP records to  verify pneumonia vaccines.  Colorectal cancer screening - no longer required  Cardiovascular disease screening  D/c Tekturna since patient is also taking ARB (olmesartan) and CKD3  Switch HCTZ to furosemide to 20mg  qam.  Recheck BP and BMET in 2 weeks.  Can increase to 40mg  or add spironolactone in future.  Diabetes - continue metformin   Checking urine microalbumin today - pending  Bone Denisty / Osteoporosis Screening - UTD  Mammogram - UTD  PAP  -no longer required  Glaucoma screening / Diabetic Eye Exam - UTD, will request records.  Nutrition counseling - discussed DASH diet  Advanced Directives - info packet given and discussed  Physical Activity - patient to try chair exercise - handout given - aim to do bid  APAP 500mg  up to tid as needed for joint pain  Orders Placed This Encounter  Procedures  . Tdap vaccine greater than or equal to 7yo IM  . Varicella-zoster vaccine IM (Shingrix)  . Microalbumin / creatinine urine ratio  . VITAMIN D 25 Hydroxy (Vit-D Deficiency, Fractures)     Goals    None       Patient Instructions (the written plan) were given to the patient.   Cherre Robins, PharmD   05/13/2016

## 2016-05-14 LAB — VITAMIN D 25 HYDROXY (VIT D DEFICIENCY, FRACTURES): VIT D 25 HYDROXY: 38.3 ng/mL (ref 30.0–100.0)

## 2016-05-14 LAB — MICROALBUMIN / CREATININE URINE RATIO
CREATININE, UR: 79.3 mg/dL
MICROALBUM., U, RANDOM: 506.9 ug/mL
Microalb/Creat Ratio: 639.2 mg/g creat — ABNORMAL HIGH (ref 0.0–30.0)

## 2016-05-14 LAB — DRAWING FEE

## 2016-05-23 ENCOUNTER — Telehealth: Payer: Self-pay | Admitting: Family Medicine

## 2016-05-23 DIAGNOSIS — R809 Proteinuria, unspecified: Secondary | ICD-10-CM

## 2016-05-23 MED ORDER — VITAMIN D 1000 UNITS PO TABS: 1000.0000 [IU] | ORAL_TABLET | Freq: Every day | ORAL | Status: DC

## 2016-05-23 NOTE — Telephone Encounter (Signed)
Patient notified of results and recommendations.  Referral sent for nephrologist - pt requested someone in [facility]

## 2016-05-27 ENCOUNTER — Other Ambulatory Visit: Payer: Self-pay | Admitting: Family Medicine

## 2016-05-27 DIAGNOSIS — F419 Anxiety disorder, unspecified: Principal | ICD-10-CM

## 2016-05-27 DIAGNOSIS — F329 Major depressive disorder, single episode, unspecified: Secondary | ICD-10-CM

## 2016-05-27 DIAGNOSIS — F32A Depression, unspecified: Secondary | ICD-10-CM

## 2016-05-29 ENCOUNTER — Encounter: Payer: Self-pay | Admitting: Nurse Practitioner

## 2016-05-29 ENCOUNTER — Ambulatory Visit (INDEPENDENT_AMBULATORY_CARE_PROVIDER_SITE_OTHER): Payer: Medicare Other | Admitting: Nurse Practitioner

## 2016-05-29 VITALS — BP 152/98 | HR 112 | Temp 97.2°F | Ht 65.0 in | Wt 224.0 lb

## 2016-05-29 DIAGNOSIS — I10 Essential (primary) hypertension: Secondary | ICD-10-CM

## 2016-05-29 DIAGNOSIS — M25561 Pain in right knee: Secondary | ICD-10-CM | POA: Diagnosis not present

## 2016-05-29 NOTE — Patient Instructions (Signed)
DASH Eating Plan DASH stands for "Dietary Approaches to Stop Hypertension." The DASH eating plan is a healthy eating plan that has been shown to reduce high blood pressure (hypertension). It may also reduce your risk for type 2 diabetes, heart disease, and stroke. The DASH eating plan may also help with weight loss. What are tips for following this plan? General guidelines  Avoid eating more than 2,300 mg (milligrams) of salt (sodium) a day. If you have hypertension, you may need to reduce your sodium intake to 1,500 mg a day.  Limit alcohol intake to no more than 1 drink a day for nonpregnant women and 2 drinks a day for men. One drink equals 12 oz of beer, 5 oz of wine, or 1 oz of hard liquor.  Work with your health care provider to maintain a healthy body weight or to lose weight. Ask what an ideal weight is for you.  Get at least 30 minutes of exercise that causes your heart to beat faster (aerobic exercise) most days of the week. Activities may include walking, swimming, or biking.  Work with your health care provider or diet and nutrition specialist (dietitian) to adjust your eating plan to your individual calorie needs. Reading food labels  Check food labels for the amount of sodium per serving. Choose foods with less than 5 percent of the Daily Value of sodium. Generally, foods with less than 300 mg of sodium per serving fit into this eating plan.  To find whole grains, look for the word "whole" as the first word in the ingredient list. Shopping  Buy products labeled as "low-sodium" or "no salt added."  Buy fresh foods. Avoid canned foods and premade or frozen meals. Cooking  Avoid adding salt when cooking. Use salt-free seasonings or herbs instead of table salt or sea salt. Check with your health care provider or pharmacist before using salt substitutes.  Do not fry foods. Cook foods using healthy methods such as baking, boiling, grilling, and broiling instead.  Cook with  heart-healthy oils, such as olive, canola, soybean, or sunflower oil. Meal planning   Eat a balanced diet that includes: ? 5 or more servings of fruits and vegetables each day. At each meal, try to fill half of your plate with fruits and vegetables. ? Up to 6-8 servings of whole grains each day. ? Less than 6 oz of lean meat, poultry, or fish each day. A 3-oz serving of meat is about the same size as a deck of cards. One egg equals 1 oz. ? 2 servings of low-fat dairy each day. ? A serving of nuts, seeds, or beans 5 times each week. ? Heart-healthy fats. Healthy fats called Omega-3 fatty acids are found in foods such as flaxseeds and coldwater fish, like sardines, salmon, and mackerel.  Limit how much you eat of the following: ? Canned or prepackaged foods. ? Food that is high in trans fat, such as fried foods. ? Food that is high in saturated fat, such as fatty meat. ? Sweets, desserts, sugary drinks, and other foods with added sugar. ? Full-fat dairy products.  Do not salt foods before eating.  Try to eat at least 2 vegetarian meals each week.  Eat more home-cooked food and less restaurant, buffet, and fast food.  When eating at a restaurant, ask that your food be prepared with less salt or no salt, if possible. What foods are recommended? The items listed may not be a complete list. Talk with your dietitian about what   dietary choices are best for you. Grains Whole-grain or whole-wheat bread. Whole-grain or whole-wheat pasta. Brown rice. Oatmeal. Quinoa. Bulgur. Whole-grain and low-sodium cereals. Pita bread. Low-fat, low-sodium crackers. Whole-wheat flour tortillas. Vegetables Fresh or frozen vegetables (raw, steamed, roasted, or grilled). Low-sodium or reduced-sodium tomato and vegetable juice. Low-sodium or reduced-sodium tomato sauce and tomato paste. Low-sodium or reduced-sodium canned vegetables. Fruits All fresh, dried, or frozen fruit. Canned fruit in natural juice (without  added sugar). Meat and other protein foods Skinless chicken or turkey. Ground chicken or turkey. Pork with fat trimmed off. Fish and seafood. Egg whites. Dried beans, peas, or lentils. Unsalted nuts, nut butters, and seeds. Unsalted canned beans. Lean cuts of beef with fat trimmed off. Low-sodium, lean deli meat. Dairy Low-fat (1%) or fat-free (skim) milk. Fat-free, low-fat, or reduced-fat cheeses. Nonfat, low-sodium ricotta or cottage cheese. Low-fat or nonfat yogurt. Low-fat, low-sodium cheese. Fats and oils Soft margarine without trans fats. Vegetable oil. Low-fat, reduced-fat, or light mayonnaise and salad dressings (reduced-sodium). Canola, safflower, olive, soybean, and sunflower oils. Avocado. Seasoning and other foods Herbs. Spices. Seasoning mixes without salt. Unsalted popcorn and pretzels. Fat-free sweets. What foods are not recommended? The items listed may not be a complete list. Talk with your dietitian about what dietary choices are best for you. Grains Baked goods made with fat, such as croissants, muffins, or some breads. Dry pasta or rice meal packs. Vegetables Creamed or fried vegetables. Vegetables in a cheese sauce. Regular canned vegetables (not low-sodium or reduced-sodium). Regular canned tomato sauce and paste (not low-sodium or reduced-sodium). Regular tomato and vegetable juice (not low-sodium or reduced-sodium). Pickles. Olives. Fruits Canned fruit in a light or heavy syrup. Fried fruit. Fruit in cream or butter sauce. Meat and other protein foods Fatty cuts of meat. Ribs. Fried meat. Bacon. Sausage. Bologna and other processed lunch meats. Salami. Fatback. Hotdogs. Bratwurst. Salted nuts and seeds. Canned beans with added salt. Canned or smoked fish. Whole eggs or egg yolks. Chicken or turkey with skin. Dairy Whole or 2% milk, cream, and half-and-half. Whole or full-fat cream cheese. Whole-fat or sweetened yogurt. Full-fat cheese. Nondairy creamers. Whipped toppings.  Processed cheese and cheese spreads. Fats and oils Butter. Stick margarine. Lard. Shortening. Ghee. Bacon fat. Tropical oils, such as coconut, palm kernel, or palm oil. Seasoning and other foods Salted popcorn and pretzels. Onion salt, garlic salt, seasoned salt, table salt, and sea salt. Worcestershire sauce. Tartar sauce. Barbecue sauce. Teriyaki sauce. Soy sauce, including reduced-sodium. Steak sauce. Canned and packaged gravies. Fish sauce. Oyster sauce. Cocktail sauce. Horseradish that you find on the shelf. Ketchup. Mustard. Meat flavorings and tenderizers. Bouillon cubes. Hot sauce and Tabasco sauce. Premade or packaged marinades. Premade or packaged taco seasonings. Relishes. Regular salad dressings. Where to find more information:  National Heart, Lung, and Blood Institute: www.nhlbi.nih.gov  American Heart Association: www.heart.org Summary  The DASH eating plan is a healthy eating plan that has been shown to reduce high blood pressure (hypertension). It may also reduce your risk for type 2 diabetes, heart disease, and stroke.  With the DASH eating plan, you should limit salt (sodium) intake to 2,300 mg a day. If you have hypertension, you may need to reduce your sodium intake to 1,500 mg a day.  When on the DASH eating plan, aim to eat more fresh fruits and vegetables, whole grains, lean proteins, low-fat dairy, and heart-healthy fats.  Work with your health care provider or diet and nutrition specialist (dietitian) to adjust your eating plan to your individual   calorie needs. This information is not intended to replace advice given to you by your health care provider. Make sure you discuss any questions you have with your health care provider. Document Released: 12/19/2010 Document Revised: 12/24/2015 Document Reviewed: 12/24/2015 Elsevier Interactive Patient Education  2017 Elsevier Inc.  

## 2016-05-29 NOTE — Progress Notes (Signed)
   Subjective:    Patient ID: Anna Dickerson, female    DOB: [birth year], 83 y.o.   MRN: [id number]  HPI Patient brought in today by her daughter. Daughter says that patient is c/o right knee pain. Daughter says patient fell out of her chair over weekend and her daughter and son in law had to help her up out of the floor. Her right knee started hurting 2 days ago. She says that the pain in her knee grabs her and she will almost fall if she is standing. She had a total knee replacement over 10 years ago. Pain is only when she goes from sitting  To standing or standing to sitting.   Review of Systems  Constitutional: Negative.   HENT: Negative.   Respiratory: Negative.   Cardiovascular: Negative.   Genitourinary: Negative.   Musculoskeletal: Negative.   Neurological: Negative.   Psychiatric/Behavioral: Negative.   All other systems reviewed and are negative.      Objective:   Physical Exam  Constitutional: She is oriented to person, place, and time. She appears well-developed and well-nourished. No distress.  Cardiovascular: Normal rate, regular rhythm and normal heart sounds.   Pulmonary/Chest: Effort normal and breath sounds normal.  Musculoskeletal:  FROM of knee without pain ? Edema Only has pain when going from sitting to standing in office.  Neurological: She is oriented to person, place, and time.  Psychiatric: She has a normal mood and affect. Her behavior is normal. Judgment and thought content normal.   BP (!) 152/98 (BP Location: Right Arm, Patient Position: Sitting, Cuff Size: Large)   Pulse (!) 112   Temp 97.2 F (36.2 C) (Oral)   Ht 5\' 5"  (1.651 m)   Wt 224 lb (101.6 kg)   BMI 37.28 kg/m       Assessment & Plan:  1. Acute pain of right knee Really needs xray to decide what to do about knee Wear support wrap until can et xray Be careful when sitting andstanding  2. Essential hypertension Patient confused about changes to blood pressure meds made at  annual wellness visit. Reviewed changes with patient and daughter Anna Dickerson was stooped but patient has still been taking it- stop now Increase clionidine to 0.1 2  BID until can see Dr. Warrick Parisian Monday daughter understood changes  Anna Dickerson, Anna Dickerson

## 2016-05-30 ENCOUNTER — Ambulatory Visit (INDEPENDENT_AMBULATORY_CARE_PROVIDER_SITE_OTHER): Payer: Medicare Other

## 2016-05-30 ENCOUNTER — Other Ambulatory Visit: Payer: Self-pay | Admitting: Nurse Practitioner

## 2016-05-30 ENCOUNTER — Ambulatory Visit: Payer: Medicare Other

## 2016-05-30 DIAGNOSIS — E538 Deficiency of other specified B group vitamins: Secondary | ICD-10-CM | POA: Diagnosis not present

## 2016-05-30 DIAGNOSIS — M25561 Pain in right knee: Secondary | ICD-10-CM | POA: Diagnosis not present

## 2016-05-30 DIAGNOSIS — W19XXXA Unspecified fall, initial encounter: Secondary | ICD-10-CM

## 2016-05-31 ENCOUNTER — Other Ambulatory Visit: Payer: Self-pay | Admitting: Family Medicine

## 2016-05-31 NOTE — Telephone Encounter (Signed)
What is the name of the medication? metformin  Have you contacted your pharmacy to request a refill? yes  Which pharmacy would you like this sent to? [facility], she is completely out   Patient notified that their request is being sent to the clinical staff for review and that they should receive a call once it is complete. If they do not receive a call within 24 hours they can check with their pharmacy or our office.

## 2016-06-02 ENCOUNTER — Ambulatory Visit (INDEPENDENT_AMBULATORY_CARE_PROVIDER_SITE_OTHER): Payer: Medicare Other | Admitting: Orthopedic Surgery

## 2016-06-02 ENCOUNTER — Encounter (INDEPENDENT_AMBULATORY_CARE_PROVIDER_SITE_OTHER): Payer: Self-pay | Admitting: Orthopedic Surgery

## 2016-06-02 ENCOUNTER — Ambulatory Visit: Payer: Medicare Other | Admitting: Family Medicine

## 2016-06-02 DIAGNOSIS — M25561 Pain in right knee: Secondary | ICD-10-CM | POA: Diagnosis not present

## 2016-06-02 DIAGNOSIS — Z96651 Presence of right artificial knee joint: Secondary | ICD-10-CM | POA: Diagnosis not present

## 2016-06-02 MED ORDER — METHOCARBAMOL 500 MG PO TABS: 500.0000 mg | ORAL_TABLET | Freq: Three times a day (TID) | ORAL | 0 refills | Status: DC | PRN

## 2016-06-04 ENCOUNTER — Ambulatory Visit (INDEPENDENT_AMBULATORY_CARE_PROVIDER_SITE_OTHER): Payer: Medicare Other | Admitting: Family Medicine

## 2016-06-04 ENCOUNTER — Encounter: Payer: Self-pay | Admitting: Family Medicine

## 2016-06-04 VITALS — BP 162/100 | HR 76 | Temp 97.4°F | Ht 65.0 in | Wt 222.0 lb

## 2016-06-04 DIAGNOSIS — E1139 Type 2 diabetes mellitus with other diabetic ophthalmic complication: Secondary | ICD-10-CM

## 2016-06-04 DIAGNOSIS — E669 Obesity, unspecified: Secondary | ICD-10-CM | POA: Diagnosis not present

## 2016-06-04 DIAGNOSIS — IMO0001 Reserved for inherently not codable concepts without codable children: Secondary | ICD-10-CM

## 2016-06-04 DIAGNOSIS — Z6836 Body mass index (BMI) 36.0-36.9, adult: Secondary | ICD-10-CM | POA: Diagnosis not present

## 2016-06-04 DIAGNOSIS — N179 Acute kidney failure, unspecified: Secondary | ICD-10-CM

## 2016-06-04 DIAGNOSIS — I10 Essential (primary) hypertension: Secondary | ICD-10-CM | POA: Diagnosis not present

## 2016-06-04 LAB — BAYER DCA HB A1C WAIVED: HB A1C: 6.7 % (ref <7.0)

## 2016-06-04 MED ORDER — AMLODIPINE BESYLATE 10 MG PO TABS: 10.0000 mg | ORAL_TABLET | Freq: Every day | ORAL | 1 refills | Status: DC

## 2016-06-04 NOTE — Progress Notes (Signed)
BP (!) 162/100   Pulse 76   Temp 97.4 F (36.3 C) (Oral)   Ht _0  (1.651 m)   Wt 222 lb (100.7 kg)   BMI 36.94 kg/m    Subjective:    Patient ID: Anna Dickerson, female    DOB: 1929-04-14, 83 y.o.   MRN: [id number]  HPI: Anna Dickerson is a 83 y.o. female presenting on 06/04/2016 for Hypertension (3 month followup; recent medication change by MMM, need to recheck BMET) and Diabetes   HPI Type 2 diabetes mellitus Patient comes in today for recheck of his diabetes. Patient has been currently taking Metformin but admits that she hasn't been taking it as consistently because of memory issues, her daughter is here with her today as well. Patient is not currently on an ACE inhibitor because of history of renal issues in the past. Patient has not seen an ophthalmologist this year. Patient denies issues with his feet. Patient does admit to obesity with a BMI of 36 and her daughter is trying to get her to focus a little bit more on healthy diet.  Hypertension Patient is currently on clonidine and Lasix, her blood pressure has been up but she has had some issues with medications and there were recent changes to her current regimen. There are also some confusion in her medications which is why her daughter is here. her blood pressure today is 162/100. Patient denies any lightheadedness or dizziness. Patient denies headaches, blurred vision, chest pains, shortness of breath, or weakness. Denies any side effects from medication and is content with current medication.   Recent acute kidney injury Patient has had her kidney function creeping up over the past half a year and some of her blood pressure medications were discontinued because of this. She is coming in today for recheck.  Relevant past medical, surgical, family and social history reviewed and updated as indicated. Interim medical history since our last visit reviewed. Allergies and medications reviewed and updated.  Review of  Systems  Constitutional: Negative for chills and fever.  Eyes: Negative for visual disturbance.  Respiratory: Negative for chest tightness and shortness of breath.   Cardiovascular: Negative for chest pain and leg swelling.  Genitourinary: Negative for difficulty urinating and dysuria.  Musculoskeletal: Negative for back pain and gait problem.  Skin: Negative for rash.  Neurological: Negative for dizziness, light-headedness and headaches.  Psychiatric/Behavioral: Negative for agitation and behavioral problems.  All other systems reviewed and are negative.   Per HPI unless specifically indicated above     Objective:    BP (!) 162/100   Pulse 76   Temp 97.4 F (36.3 C) (Oral)   Ht _1  (1.651 m)   Wt 222 lb (100.7 kg)   BMI 36.94 kg/m   Wt Readings from Last 3 Encounters:  06/04/16 222 lb (100.7 kg)  05/29/16 224 lb (101.6 kg)  05/13/16 222 lb 8 oz (100.9 kg)    Physical Exam  Constitutional: She is oriented to person, place, and time. She appears well-developed and well-nourished. No distress.  Eyes: Conjunctivae are normal.  Neck: Neck supple. No thyromegaly present.  Cardiovascular: Normal rate, regular rhythm, normal heart sounds and intact distal pulses.   No murmur heard. Pulmonary/Chest: Effort normal and breath sounds normal. No respiratory distress. She has no wheezes. She has no rales.  Musculoskeletal: Normal range of motion.  Lymphadenopathy:    She has no cervical adenopathy.  Neurological: She is alert and oriented to person, place, and  time. Coordination normal.  Skin: Skin is warm and dry. No rash noted. She is not diaphoretic.  Psychiatric: She has a normal mood and affect. Her behavior is normal.  Nursing note and vitals reviewed.       Assessment & Plan:   Problem List Items Addressed This Visit      Cardiovascular and Mediastinum   HTN (hypertension)   Relevant Medications   amLODipine (NORVASC) 10 MG tablet   Other Relevant Orders    CMP14+EGFR (Completed)     Endocrine   DM (diabetes mellitus) ([place]) - Primary   Relevant Orders   Bayer DCA Hb A1c Waived (Completed)     Other   Obesity    Other Visit Diagnoses    Acute kidney injury ([place])       Creatinine has been creeping up over the past few months, we'll recheck today       Follow up plan: Return in about 4 weeks (around 07/02/2016), or if symptoms worsen or fail to improve, for hypertension.  Counseling provided for all of the vaccine components Orders Placed This Encounter  Procedures  . Bayer DCA Hb A1c Waived  . [place], MD [facility] 06/04/2016, 10:40 AM

## 2016-06-04 NOTE — Progress Notes (Signed)
Office Visit Note   Patient: Anna Dickerson           Date of Birth: May 15, 1929           MRN: [id number] Visit Date: 06/02/2016 Requested by: Dettinger, Fransisca Kaufmann, MD [place], [place] [address] PCP: Dettinger, Fransisca Kaufmann, MD  Subjective: Chief Complaint  Patient presents with  . Right Knee - Pain    HPI: Anna Dickerson is an 83 year old patient with right knee and leg pain.  She reports pain since last Tuesday after tripping in the chair and falling.  She had right total knee replacement done over 10 years ago.  She's doing well with that.  She reports some swelling and she indicates with a cane.  This pain will shoot into her thigh.  She's not having much back pain.  It is hard for her to get up.  She is taking Aleve for the problem.  Denies any numbness and tingling.  She is 6 days out from her injury.  She does report some mild trochanteric pain.  Denies any numbness and tingling in the leg              ROS: All systems reviewed are negative as they relate to the chief complaint within the history of present illness.  Patient denies  fevers or chills.   Assessment & Plan: Visit Diagnoses:  1. Acute pain of right knee   2. Presence of right artificial knee joint     Plan: Impression is right leg pain with no evidence of hip fracture on exam and no evidence of total knee replacement issues on radiographs.  I will try her with some Robaxin see her back in 4 weeks and we can decide if we need to pursue further imaging.  Clinical picture may clarify some between now and then.  Don't see anything acutely fractured or injured at this time.  Follow-Up Instructions: Return in about 4 weeks (around 06/30/2016).   Orders:  No orders of the defined types were placed in this encounter.  Meds ordered this encounter  Medications  . methocarbamol (ROBAXIN) 500 MG tablet    Sig: Take 1 tablet (500 mg total) by mouth every 8 (eight) hours as needed for muscle spasms.    Dispense:  30  tablet    Refill:  0      Procedures: No procedures performed   Clinical Data: No additional findings.  Objective: Vital Signs: There were no vitals taken for this visit.  Physical Exam:   Constitutional: Patient appears well-developed HEENT:  Head: Normocephalic Eyes:EOM are normal Neck: Normal range of motion Cardiovascular: Normal rate Pulmonary/chest: Effort normal Neurologic: Patient is alert Skin: Skin is warm Psychiatric: Patient has normal mood and affect    Ortho Exam: Orthopedic exam demonstrates reasonable range of motion of the right knee with no effusion collaterals are stable extensor mechanism is intact.  There is no groin pain with internal/external rotation of the leg.  Only slight trochanteric tenderness.  Pre-reasonable and symmetric hip flexion strength on both sides.  Ankle dorsi flexion plantar flexion is intact  Specialty Comments:  No specialty comments available.  Imaging: No results found.   PMFS History: Patient Active Problem List   Diagnosis Date Noted  . Anxiety and depression 03/20/2015  . OAB (overactive bladder) 03/20/2015  . B12 deficiency 02/08/2015  . Iron deficiency anemia due to chronic blood loss 02/02/2013  . Iron deficiency 02/24/2012  . Stage I left-sided Breast cancer  12/31/2010  . Obesity 12/31/2010  . HTN (hypertension) 12/31/2010  . Macular degeneration 12/31/2010  . DM (diabetes mellitus) ([place]) 12/31/2010   Past Medical History:  Diagnosis Date  . B12 deficiency 02/08/2015  . Cancer [facility] [id number]   breast  . Diabetes mellitus   . DM (diabetes mellitus) ([place]) 12/31/2010  . HTN (hypertension) 12/31/2010  . Hypertension   . Iron deficiency 02/24/2012  . Macular degeneration 12/31/2010  . Macular degeneration, left eye   . OAB (overactive bladder)   . Obesity 12/31/2010  . Stage I left-sided Breast cancer 12/31/2010    Family History  Problem Relation Age of Onset  . Cancer Mother     Past Surgical  History:  Procedure Laterality Date  . bilateral cataract with intraocular lens implant    . Bladder tacked-up    . BREAST SURGERY  left    . REPLACEMENT TOTAL KNEE BILATERAL Bilateral    TKR were ar different times about 2 yeras apart.  Marland Kitchen SKIN CANCER EXCISION     squamous cell right ear and calf right leg   Social History   Occupational History  . Not on file.   Social History Main Topics  . Smoking status: Never Smoker  . Smokeless tobacco: Never Used  . Alcohol use No  . Drug use: No  . Sexual activity: No

## 2016-06-05 ENCOUNTER — Telehealth: Payer: Self-pay | Admitting: Family Medicine

## 2016-06-05 ENCOUNTER — Telehealth: Payer: Self-pay

## 2016-06-05 ENCOUNTER — Encounter: Payer: Self-pay | Admitting: Family Medicine

## 2016-06-05 LAB — CMP14+EGFR
ALT: 22 IU/L (ref 0–32)
AST: 24 IU/L (ref 0–40)
Albumin/Globulin Ratio: 1.4 (ref 1.2–2.2)
Albumin: 4.5 g/dL (ref 3.5–4.7)
Alkaline Phosphatase: 69 IU/L (ref 39–117)
BILIRUBIN TOTAL: 0.3 mg/dL (ref 0.0–1.2)
BUN / CREAT RATIO: 23 (ref 12–28)
BUN: 35 mg/dL — AB (ref 8–27)
CALCIUM: 10.6 mg/dL — AB (ref 8.7–10.3)
CO2: 25 mmol/L (ref 18–29)
CREATININE: 1.5 mg/dL — AB (ref 0.57–1.00)
Chloride: 99 mmol/L (ref 96–106)
GFR, EST AFRICAN AMERICAN: 37 mL/min/{1.73_m2} — AB (ref >59)
GFR, EST NON AFRICAN AMERICAN: 32 mL/min/{1.73_m2} — AB (ref >59)
GLUCOSE: 152 mg/dL — AB (ref 65–99)
Globulin, Total: 3.3 g/dL (ref 1.5–4.5)
Potassium: 4.3 mmol/L (ref 3.5–5.2)
Sodium: 142 mmol/L (ref 134–144)
TOTAL PROTEIN: 7.8 g/dL (ref 6.0–8.5)

## 2016-06-06 ENCOUNTER — Telehealth: Payer: Self-pay

## 2016-06-06 ENCOUNTER — Other Ambulatory Visit: Payer: Self-pay | Admitting: Family Medicine

## 2016-06-06 DIAGNOSIS — I10 Essential (primary) hypertension: Secondary | ICD-10-CM

## 2016-06-06 DIAGNOSIS — E785 Hyperlipidemia, unspecified: Secondary | ICD-10-CM

## 2016-06-06 MED ORDER — VITAMIN D (ERGOCALCIFEROL) 1.25 MG (50000 UNIT) PO CAPS: 50000.0000 [IU] | ORAL_CAPSULE | ORAL | 1 refills | Status: DC

## 2016-06-06 MED ORDER — ATORVASTATIN CALCIUM 20 MG PO TABS: ORAL_TABLET | ORAL | 1 refills | Status: DC

## 2016-06-06 MED ORDER — MIRABEGRON ER 25 MG PO TB24: 25.0000 mg | ORAL_TABLET | Freq: Every day | ORAL | 1 refills | Status: DC

## 2016-06-06 MED ORDER — FUROSEMIDE 20 MG PO TABS: 20.0000 mg | ORAL_TABLET | Freq: Every day | ORAL | 3 refills | Status: DC

## 2016-06-06 MED ORDER — METFORMIN HCL 1000 MG PO TABS: 1000.0000 mg | ORAL_TABLET | Freq: Every day | ORAL | 0 refills | Status: DC

## 2016-06-06 NOTE — Telephone Encounter (Signed)
done

## 2016-06-06 NOTE — Telephone Encounter (Signed)
refills  

## 2016-06-13 ENCOUNTER — Other Ambulatory Visit (HOSPITAL_COMMUNITY): Payer: Self-pay | Admitting: Nephrology

## 2016-06-13 DIAGNOSIS — N183 Chronic kidney disease, stage 3 unspecified: Secondary | ICD-10-CM

## 2016-06-15 ENCOUNTER — Encounter: Payer: Self-pay | Admitting: Family Medicine

## 2016-06-16 ENCOUNTER — Other Ambulatory Visit: Payer: Self-pay | Admitting: Family Medicine

## 2016-06-16 DIAGNOSIS — I10 Essential (primary) hypertension: Secondary | ICD-10-CM

## 2016-06-27 ENCOUNTER — Ambulatory Visit (HOSPITAL_COMMUNITY)
Admission: RE | Admit: 2016-06-27 | Discharge: 2016-06-27 | Disposition: A | Discharge status: Home / Self Care | Payer: Medicare Other | Source: Ambulatory Visit | Attending: Nephrology | Admitting: Nephrology

## 2016-06-27 DIAGNOSIS — N281 Cyst of kidney, acquired: Secondary | ICD-10-CM | POA: Insufficient documentation

## 2016-06-27 DIAGNOSIS — N183 Chronic kidney disease, stage 3 unspecified: Secondary | ICD-10-CM

## 2016-07-04 ENCOUNTER — Ambulatory Visit (INDEPENDENT_AMBULATORY_CARE_PROVIDER_SITE_OTHER): Payer: Medicare Other | Admitting: Orthopedic Surgery

## 2016-07-04 ENCOUNTER — Ambulatory Visit: Payer: Medicare Other | Admitting: Family Medicine

## 2016-07-22 ENCOUNTER — Other Ambulatory Visit: Payer: Self-pay | Admitting: Family Medicine

## 2016-07-22 DIAGNOSIS — F419 Anxiety disorder, unspecified: Principal | ICD-10-CM

## 2016-07-22 DIAGNOSIS — F329 Major depressive disorder, single episode, unspecified: Secondary | ICD-10-CM

## 2016-07-25 ENCOUNTER — Ambulatory Visit: Payer: Medicare Other | Admitting: Family Medicine

## 2016-07-27 ENCOUNTER — Encounter: Payer: Self-pay | Admitting: Family Medicine

## 2016-07-28 ENCOUNTER — Telehealth: Payer: Self-pay | Admitting: Family Medicine

## 2016-07-28 DIAGNOSIS — I10 Essential (primary) hypertension: Secondary | ICD-10-CM

## 2016-07-28 MED ORDER — CLONIDINE HCL 0.1 MG PO TABS: 0.1000 mg | ORAL_TABLET | Freq: Two times a day (BID) | ORAL | 1 refills | Status: DC

## 2016-07-28 NOTE — Telephone Encounter (Signed)
NA

## 2016-07-30 NOTE — Telephone Encounter (Signed)
Daughter, Jefferson Fuel, informed that prescription was sent to [facility].  She requested that we change her pharmacy to [facility].

## 2016-07-31 ENCOUNTER — Ambulatory Visit (INDEPENDENT_AMBULATORY_CARE_PROVIDER_SITE_OTHER): Payer: Medicare Other | Admitting: Family Medicine

## 2016-07-31 ENCOUNTER — Encounter: Payer: Self-pay | Admitting: Family Medicine

## 2016-07-31 VITALS — BP 159/77 | HR 103 | Temp 97.5°F | Ht 65.0 in | Wt 225.0 lb

## 2016-07-31 DIAGNOSIS — N183 Chronic kidney disease, stage 3 unspecified: Secondary | ICD-10-CM

## 2016-07-31 DIAGNOSIS — E1122 Type 2 diabetes mellitus with diabetic chronic kidney disease: Secondary | ICD-10-CM | POA: Diagnosis not present

## 2016-07-31 DIAGNOSIS — E538 Deficiency of other specified B group vitamins: Secondary | ICD-10-CM | POA: Diagnosis not present

## 2016-07-31 DIAGNOSIS — I10 Essential (primary) hypertension: Secondary | ICD-10-CM | POA: Diagnosis not present

## 2016-07-31 MED ORDER — DULAGLUTIDE 0.75 MG/0.5ML ~~LOC~~ SOAJ: 0.7500 mg | SUBCUTANEOUS | 2 refills | Status: DC

## 2016-07-31 NOTE — Progress Notes (Signed)
BP (!) 159/77   Pulse (!) 103   Temp (!) 97.5 F (36.4 C) (Oral)   Ht _0  (1.651 m)   Wt 225 lb (102.1 kg)   BMI 37.44 kg/m    Subjective:    Patient ID: Anna Dickerson, female    DOB: May 30, 1929, 83 y.o.   MRN: [id number]  HPI: Anna Dickerson is a 83 y.o. female presenting on 07/31/2016 for No chief complaint on file.   HPI Hypertension Patient is currently on Lisinopril and clonidine and amlodipine the lisinopril was started 6 days ago by nephrology, and their blood pressure today is 159/77. Patient denies any lightheadedness or dizziness. Patient denies headaches, blurred vision, chest pains, shortness of breath, or weakness. Denies any side effects from medication and is content with current medication.  Type 2 diabetes mellitus Patient comes in today for recheck of his diabetes. Patient has been currently taking metformin 1000 daily, was reduced because of renal function by nephrologist. Patient is currently on an ACE inhibitor/ARB that was just started 6 days ago by nephrology. Patient has not seen an ophthalmologist this year. Patient denies any issues with their feet.   Chronic kidney disease Patient has seen a nephrologist and wants to have her blood work checked again in a week and has an appointment to go back in October but he will monitor the kidney function and if worsens starting lisinopril will see her sooner and adjust accordingly.  Relevant past medical, surgical, family and social history reviewed and updated as indicated. Interim medical history since our last visit reviewed. Allergies and medications reviewed and updated.  Review of Systems  Constitutional: Negative for chills and fever.  HENT: Negative for congestion, ear discharge and ear pain.   Eyes: Negative for redness and visual disturbance.  Respiratory: Negative for chest tightness and shortness of breath.   Cardiovascular: Negative for chest pain and leg swelling.  Genitourinary: Negative  for difficulty urinating and dysuria.  Musculoskeletal: Negative for back pain and gait problem.  Skin: Negative for rash.  Neurological: Negative for dizziness, light-headedness and headaches.  Psychiatric/Behavioral: Negative for agitation and behavioral problems.  All other systems reviewed and are negative.   Per HPI unless specifically indicated above      Objective:    BP (!) 159/77   Pulse (!) 103   Temp (!) 97.5 F (36.4 C) (Oral)   Ht _1  (1.651 m)   Wt 225 lb (102.1 kg)   BMI 37.44 kg/m   Wt Readings from Last 3 Encounters:  07/31/16 225 lb (102.1 kg)  06/04/16 222 lb (100.7 kg)  05/29/16 224 lb (101.6 kg)    Physical Exam  Constitutional: She is oriented to person, place, and time. She appears well-developed and well-nourished. No distress.  Eyes: Conjunctivae are normal.  Neck: Neck supple. No thyromegaly present.  Cardiovascular: Normal rate, regular rhythm, normal heart sounds and intact distal pulses.   No murmur heard. Pulmonary/Chest: Effort normal and breath sounds normal. No respiratory distress. She has no wheezes.  Musculoskeletal: Normal range of motion. She exhibits no edema.  Lymphadenopathy:    She has no cervical adenopathy.  Neurological: She is alert and oriented to person, place, and time. Coordination normal.  Skin: Skin is warm and dry. No rash noted. She is not diaphoretic.  Psychiatric: She has a normal mood and affect. Her behavior is normal.  Nursing note and vitals reviewed.     Assessment & Plan:   Problem List Items Addressed  This Visit      Cardiovascular and Mediastinum   HTN (hypertension) - Primary   Relevant Medications   lisinopril (PRINIVIL,ZESTRIL) 5 MG tablet   Other Relevant Orders   CMP14+EGFR     Endocrine   DM (diabetes mellitus) ([place])   Relevant Medications   lisinopril (PRINIVIL,ZESTRIL) 5 MG tablet   Other Relevant Orders   Bayer DCA Hb A1c Waived     Genitourinary   Chronic kidney disease (CKD),  stage III (moderate)   Relevant Orders   CMP14+EGFR       Follow up plan: Return in about 4 weeks (around 08/28/2016), or if symptoms worsen or fail to improve, for Hypertension diabetes recheck.  Counseling provided for all of the vaccine components Orders Placed This Encounter  Procedures  . CMP14+EGFR  . Bayer [facility] Hb A1c [place], MD [facility] 07/31/2016, 1:16 PM

## 2016-08-04 ENCOUNTER — Other Ambulatory Visit: Payer: Self-pay | Admitting: Family Medicine

## 2016-08-04 DIAGNOSIS — E785 Hyperlipidemia, unspecified: Secondary | ICD-10-CM

## 2016-08-04 DIAGNOSIS — I10 Essential (primary) hypertension: Secondary | ICD-10-CM

## 2016-08-08 ENCOUNTER — Other Ambulatory Visit: Payer: Medicare Other

## 2016-08-08 DIAGNOSIS — E1122 Type 2 diabetes mellitus with diabetic chronic kidney disease: Secondary | ICD-10-CM

## 2016-08-08 DIAGNOSIS — N183 Chronic kidney disease, stage 3 unspecified: Secondary | ICD-10-CM

## 2016-08-08 DIAGNOSIS — I10 Essential (primary) hypertension: Secondary | ICD-10-CM

## 2016-08-09 ENCOUNTER — Other Ambulatory Visit: Payer: Self-pay | Admitting: Family Medicine

## 2016-08-09 ENCOUNTER — Encounter: Payer: Self-pay | Admitting: Family Medicine

## 2016-08-09 DIAGNOSIS — F329 Major depressive disorder, single episode, unspecified: Secondary | ICD-10-CM

## 2016-08-09 DIAGNOSIS — F419 Anxiety disorder, unspecified: Principal | ICD-10-CM

## 2016-08-09 LAB — CMP14+EGFR
A/G RATIO: 1.4 (ref 1.2–2.2)
ALBUMIN: 4.3 g/dL (ref 3.5–4.7)
ALT: 17 IU/L (ref 0–32)
AST: 22 IU/L (ref 0–40)
Alkaline Phosphatase: 72 IU/L (ref 39–117)
BUN / CREAT RATIO: 20 (ref 12–28)
BUN: 31 mg/dL — ABNORMAL HIGH (ref 8–27)
Bilirubin Total: 0.3 mg/dL (ref 0.0–1.2)
CALCIUM: 9.7 mg/dL (ref 8.7–10.3)
CO2: 22 mmol/L (ref 20–29)
CREATININE: 1.53 mg/dL — AB (ref 0.57–1.00)
Chloride: 104 mmol/L (ref 96–106)
GFR, EST AFRICAN AMERICAN: 36 mL/min/{1.73_m2} — AB (ref >59)
GFR, EST NON AFRICAN AMERICAN: 31 mL/min/{1.73_m2} — AB (ref >59)
GLOBULIN, TOTAL: 3.1 g/dL (ref 1.5–4.5)
Glucose: 147 mg/dL — ABNORMAL HIGH (ref 65–99)
POTASSIUM: 4.1 mmol/L (ref 3.5–5.2)
SODIUM: 143 mmol/L (ref 134–144)
Total Protein: 7.4 g/dL (ref 6.0–8.5)

## 2016-08-11 LAB — BAYER DCA HB A1C WAIVED: HB A1C: 7.4 % — AB (ref <7.0)

## 2016-08-12 ENCOUNTER — Encounter: Payer: Self-pay | Admitting: Family Medicine

## 2016-08-26 ENCOUNTER — Other Ambulatory Visit: Payer: Self-pay | Admitting: Family Medicine

## 2016-08-26 DIAGNOSIS — I10 Essential (primary) hypertension: Secondary | ICD-10-CM

## 2016-09-01 ENCOUNTER — Encounter: Payer: Self-pay | Admitting: Family Medicine

## 2016-09-01 ENCOUNTER — Ambulatory Visit (INDEPENDENT_AMBULATORY_CARE_PROVIDER_SITE_OTHER): Payer: Medicare Other | Admitting: Family Medicine

## 2016-09-01 VITALS — BP 177/93 | HR 103 | Temp 97.8°F | Ht 65.0 in | Wt 221.0 lb

## 2016-09-01 DIAGNOSIS — I10 Essential (primary) hypertension: Secondary | ICD-10-CM

## 2016-09-01 DIAGNOSIS — E538 Deficiency of other specified B group vitamins: Secondary | ICD-10-CM

## 2016-09-01 MED ORDER — LISINOPRIL 10 MG PO TABS: 10.0000 mg | ORAL_TABLET | Freq: Every day | ORAL | 1 refills | Status: DC

## 2016-09-01 MED ORDER — DULAGLUTIDE 0.75 MG/0.5ML ~~LOC~~ SOAJ: 0.7500 mg | SUBCUTANEOUS | 2 refills | Status: DC

## 2016-09-01 NOTE — Progress Notes (Signed)
BP (!) 177/93   Pulse (!) 103   Temp 97.8 F (36.6 C) (Oral)   Ht 5' 5"  (1.651 m)   Wt 221 lb (100.2 kg)   BMI 36.78 kg/m    Subjective:    Patient ID: Anna Dickerson, female    DOB: 08-04-1929, 83 y.o.   MRN: [id number]  HPI: Anna Dickerson is a 83 y.o. female presenting on 09/01/2016 for Hypertension (4 week followup)   HPI Hypertension Patient is currently on Amlodipine and clonidine and lisinopril, and their blood pressure today is 177/93. Patient denies any lightheadedness or dizziness. Patient denies headaches, blurred vision, chest pains, shortness of breath, or weakness. Denies any side effects from medication and is content with current medication. We have been struggling to get her blood pressure under control, her nephrologist that it was okay to start lisinopril and did not see any bump in renal function will be started it we'll try to increase the dose slightly and see if that gives any more benefit.  Relevant past medical, surgical, family and social history reviewed and updated as indicated. Interim medical history since our last visit reviewed. Allergies and medications reviewed and updated.  Review of Systems  Constitutional: Negative for chills and fever.  Eyes: Negative for redness and visual disturbance.  Respiratory: Negative for chest tightness and shortness of breath.   Cardiovascular: Negative for chest pain and leg swelling.  Musculoskeletal: Negative for back pain and gait problem.  Skin: Negative for rash.  Neurological: Negative for dizziness, weakness, light-headedness, numbness and headaches.  Psychiatric/Behavioral: Negative for agitation and behavioral problems.  All other systems reviewed and are negative.   Per HPI unless specifically indicated above     Objective:    BP (!) 177/93   Pulse (!) 103   Temp 97.8 F (36.6 C) (Oral)   Ht 5' 5"  (1.651 m)   Wt 221 lb (100.2 kg)   BMI 36.78 kg/m   Wt Readings from Last 3 Encounters:   09/01/16 221 lb (100.2 kg)  07/31/16 225 lb (102.1 kg)  06/04/16 222 lb (100.7 kg)    Physical Exam  Constitutional: She is oriented to person, place, and time. She appears well-developed and well-nourished. No distress.  Eyes: Conjunctivae are normal.  Neck: Neck supple. No thyromegaly present.  Cardiovascular: Normal rate, regular rhythm, normal heart sounds and intact distal pulses.   No murmur heard. Pulmonary/Chest: Effort normal and breath sounds normal. No respiratory distress. She has no wheezes. She has no rales.  Musculoskeletal: Normal range of motion. She exhibits no edema.  Lymphadenopathy:    She has no cervical adenopathy.  Neurological: She is alert and oriented to person, place, and time. Coordination normal.  Skin: Skin is warm and dry. No rash noted. She is not diaphoretic.  Psychiatric: She has a normal mood and affect. Her behavior is normal.  Nursing note and vitals reviewed.     Assessment & Plan:   Problem List Items Addressed This Visit      Cardiovascular and Mediastinum   HTN (hypertension) - Primary   Relevant Medications   Dulaglutide (TRULICITY) 0.27 XA/1.2IN SOPN   lisinopril (PRINIVIL,ZESTRIL) 10 MG tablet   Other Relevant Orders   CMP14+EGFR     Follow up plan: Return in about 4 weeks (around 09/29/2016), or if symptoms worsen or fail to improve, for Recheck blood pressure.  Counseling provided for all of the vaccine components Orders Placed This Encounter  Procedures  . CMP14+EGFR  Caryl Pina, MD [facility] 09/01/2016, 5:12 PM

## 2016-09-01 NOTE — Progress Notes (Signed)
B-12 injection given to right deltoid, patient tolerated well. 

## 2016-09-08 ENCOUNTER — Other Ambulatory Visit: Payer: Self-pay | Admitting: *Deleted

## 2016-09-08 DIAGNOSIS — F419 Anxiety disorder, unspecified: Principal | ICD-10-CM

## 2016-09-08 DIAGNOSIS — F329 Major depressive disorder, single episode, unspecified: Secondary | ICD-10-CM

## 2016-09-08 MED ORDER — MELATONIN 5 MG PO TABS: 1.0000 | ORAL_TABLET | Freq: Every day | ORAL | 6 refills | Status: DC

## 2016-09-16 ENCOUNTER — Other Ambulatory Visit: Payer: Medicare Other

## 2016-09-16 DIAGNOSIS — I10 Essential (primary) hypertension: Secondary | ICD-10-CM

## 2016-09-17 LAB — CMP14+EGFR
A/G RATIO: 1.3 (ref 1.2–2.2)
ALBUMIN: 4.2 g/dL (ref 3.5–4.7)
ALK PHOS: 68 IU/L (ref 39–117)
ALT: 15 IU/L (ref 0–32)
AST: 20 IU/L (ref 0–40)
BILIRUBIN TOTAL: 0.2 mg/dL (ref 0.0–1.2)
BUN / CREAT RATIO: 20 (ref 12–28)
BUN: 32 mg/dL — AB (ref 8–27)
CALCIUM: 9.7 mg/dL (ref 8.7–10.3)
CHLORIDE: 104 mmol/L (ref 96–106)
CO2: 21 mmol/L (ref 20–29)
Creatinine, Ser: 1.57 mg/dL — ABNORMAL HIGH (ref 0.57–1.00)
GFR calc Af Amer: 35 mL/min/{1.73_m2} — ABNORMAL LOW (ref >59)
GFR calc non Af Amer: 30 mL/min/{1.73_m2} — ABNORMAL LOW (ref >59)
Globulin, Total: 3.2 g/dL (ref 1.5–4.5)
Glucose: 128 mg/dL — ABNORMAL HIGH (ref 65–99)
POTASSIUM: 4.8 mmol/L (ref 3.5–5.2)
Sodium: 143 mmol/L (ref 134–144)
TOTAL PROTEIN: 7.4 g/dL (ref 6.0–8.5)

## 2016-09-30 ENCOUNTER — Encounter: Payer: Self-pay | Admitting: Family Medicine

## 2016-09-30 ENCOUNTER — Ambulatory Visit (INDEPENDENT_AMBULATORY_CARE_PROVIDER_SITE_OTHER): Payer: Medicare Other | Admitting: Family Medicine

## 2016-09-30 VITALS — BP 176/93 | HR 104 | Temp 97.6°F | Ht 65.0 in | Wt 217.0 lb

## 2016-09-30 DIAGNOSIS — I152 Hypertension secondary to endocrine disorders: Secondary | ICD-10-CM

## 2016-09-30 DIAGNOSIS — I10 Essential (primary) hypertension: Secondary | ICD-10-CM

## 2016-09-30 DIAGNOSIS — E1159 Type 2 diabetes mellitus with other circulatory complications: Secondary | ICD-10-CM

## 2016-09-30 NOTE — Progress Notes (Signed)
BP (!) 176/93   Pulse (!) 104   Temp 97.6 F (36.4 C) (Oral)   Ht 5' 5"  (1.651 m)   Wt 217 lb (98.4 kg)   BMI 36.11 kg/m    Subjective:    Patient ID: Anna Dickerson, female    DOB: [birth year], 83 y.o.   MRN: [id number]  HPI: Anna Dickerson is a 83 y.o. female presenting on 09/30/2016 for Hypertension (recheck)   HPI Hypertension Patient is currently on Amlodipine and clonidine and lisinopril, and their blood pressure today is 176/93. Her blood pressure has been a challenge for quite some time and especially because of her renal disease we cannot use her medications. She is also seeing nephrology to help manage this and sees them again next week. Patient denies any lightheadedness or dizziness. Patient denies headaches, blurred vision, chest pains, shortness of breath, or weakness. Denies any side effects from medication and is content with current medication.   Relevant past medical, surgical, family and social history reviewed and updated as indicated. Interim medical history since our last visit reviewed. Allergies and medications reviewed and updated.  Review of Systems  Constitutional: Negative for chills and fever.  Eyes: Negative for redness and visual disturbance.  Respiratory: Negative for cough, chest tightness and shortness of breath.   Cardiovascular: Negative for chest pain and leg swelling.  Genitourinary: Negative for difficulty urinating and dysuria.  Musculoskeletal: Negative for back pain and gait problem.  Skin: Negative for rash.  Neurological: Negative for light-headedness and headaches.  Psychiatric/Behavioral: Negative for agitation and behavioral problems.  All other systems reviewed and are negative.   Per HPI unless specifically indicated above        Objective:    BP (!) 176/93   Pulse (!) 104   Temp 97.6 F (36.4 C) (Oral)   Ht 5' 5"  (1.651 m)   Wt 217 lb (98.4 kg)   BMI 36.11 kg/m   Wt Readings from Last 3 Encounters:    09/30/16 217 lb (98.4 kg)  09/01/16 221 lb (100.2 kg)  07/31/16 225 lb (102.1 kg)    Physical Exam  Constitutional: She is oriented to person, place, and time. She appears well-developed and well-nourished. No distress.  Eyes: Conjunctivae are normal.  Neck: Neck supple. No thyromegaly present.  Cardiovascular: Normal rate, regular rhythm, normal heart sounds and intact distal pulses.   No murmur heard. Pulmonary/Chest: Effort normal and breath sounds normal. No respiratory distress. She has no wheezes.  Musculoskeletal: Normal range of motion. She exhibits no edema.  Lymphadenopathy:    She has no cervical adenopathy.  Neurological: She is alert and oriented to person, place, and time. Coordination normal.  Skin: Skin is warm and dry. No rash noted. She is not diaphoretic.  Psychiatric: She has a normal mood and affect. Her behavior is normal.  Nursing note and vitals reviewed.   Results for orders placed or performed in visit on 09/16/16  CMP14+EGFR  Result Value Ref Range   Glucose 128 (H) 65 - 99 mg/dL   BUN 32 (H) 8 - 27 mg/dL   Creatinine, Ser 1.57 (H) 0.57 - 1.00 mg/dL   GFR calc non Af Amer 30 (L) >59 mL/min/1.73   GFR calc Af Amer 35 (L) >59 mL/min/1.73   BUN/Creatinine Ratio 20 12 - 28   Sodium 143 134 - 144 mmol/L   Potassium 4.8 3.5 - 5.2 mmol/L   Chloride 104 96 - 106 mmol/L   CO2 21 20 - 29  mmol/L   Calcium 9.7 8.7 - 10.3 mg/dL   Total Protein 7.4 6.0 - 8.5 g/dL   Albumin 4.2 3.5 - 4.7 g/dL   Globulin, Total 3.2 1.5 - 4.5 g/dL   Albumin/Globulin Ratio 1.3 1.2 - 2.2   Bilirubin Total 0.2 0.0 - 1.2 mg/dL   Alkaline Phosphatase 68 39 - 117 IU/L   AST 20 0 - 40 IU/L   ALT 15 0 - 32 IU/L      Assessment & Plan:   Problem List Items Addressed This Visit      Cardiovascular and Mediastinum   Hypertension associated with diabetes ([place]) - Primary   Relevant Orders   DME Other see comment       Follow up plan: Return in about 2 months (around 11/30/2016),  or if symptoms worsen or fail to improve, for Hypertension recheck.  Counseling provided for all of the vaccine components Orders Placed This Encounter  Procedures  . DME Other see comment    Caryl Pina, MD [facility] 09/30/2016, 5:04 PM

## 2016-10-03 ENCOUNTER — Ambulatory Visit (INDEPENDENT_AMBULATORY_CARE_PROVIDER_SITE_OTHER): Payer: Medicare Other | Admitting: *Deleted

## 2016-10-03 DIAGNOSIS — E538 Deficiency of other specified B group vitamins: Secondary | ICD-10-CM

## 2016-10-03 NOTE — Progress Notes (Signed)
Pt given Cyanocobalamin inj Tolerated well 

## 2016-10-08 ENCOUNTER — Ambulatory Visit (HOSPITAL_COMMUNITY)
Admission: RE | Admit: 2016-10-08 | Discharge: 2016-10-08 | Disposition: A | Discharge status: Home / Self Care | Payer: Medicare Other | Source: Ambulatory Visit | Attending: Oncology | Admitting: Oncology

## 2016-10-08 DIAGNOSIS — C50912 Malignant neoplasm of unspecified site of left female breast: Secondary | ICD-10-CM

## 2016-10-08 DIAGNOSIS — Z170 Estrogen receptor positive status [ER+]: Secondary | ICD-10-CM

## 2016-10-08 DIAGNOSIS — Z1231 Encounter for screening mammogram for malignant neoplasm of breast: Secondary | ICD-10-CM | POA: Diagnosis present

## 2016-10-17 ENCOUNTER — Other Ambulatory Visit: Payer: Self-pay | Admitting: Family Medicine

## 2016-10-17 NOTE — Telephone Encounter (Signed)
Called in.

## 2016-10-17 NOTE — Telephone Encounter (Signed)
Last seen 09/30/16  Dr Dettinger  If approved route to nurse to call into [facility]

## 2016-10-17 NOTE — Telephone Encounter (Signed)
Go ahead and call in refill 

## 2016-10-20 ENCOUNTER — Other Ambulatory Visit: Payer: Self-pay | Admitting: Family Medicine

## 2016-10-20 DIAGNOSIS — I10 Essential (primary) hypertension: Secondary | ICD-10-CM

## 2016-10-28 ENCOUNTER — Other Ambulatory Visit: Payer: Medicare Other

## 2016-10-29 ENCOUNTER — Other Ambulatory Visit: Payer: Medicare Other

## 2016-11-03 ENCOUNTER — Ambulatory Visit (INDEPENDENT_AMBULATORY_CARE_PROVIDER_SITE_OTHER): Payer: Medicare Other | Admitting: *Deleted

## 2016-11-03 DIAGNOSIS — E538 Deficiency of other specified B group vitamins: Secondary | ICD-10-CM

## 2016-11-03 NOTE — Progress Notes (Signed)
Pt given Cyanocobalamin inj Tolerated well 

## 2016-11-07 ENCOUNTER — Encounter: Payer: Self-pay | Admitting: Family Medicine

## 2016-11-07 ENCOUNTER — Other Ambulatory Visit: Payer: Self-pay | Admitting: Family Medicine

## 2016-11-07 DIAGNOSIS — F329 Major depressive disorder, single episode, unspecified: Secondary | ICD-10-CM

## 2016-11-07 DIAGNOSIS — F419 Anxiety disorder, unspecified: Principal | ICD-10-CM

## 2016-11-19 ENCOUNTER — Other Ambulatory Visit: Payer: Medicare Other

## 2016-12-01 ENCOUNTER — Encounter: Payer: Self-pay | Admitting: Family Medicine

## 2016-12-01 ENCOUNTER — Ambulatory Visit: Payer: Medicare Other | Admitting: Family Medicine

## 2016-12-01 VITALS — BP 139/80 | HR 108 | Temp 97.3°F | Ht 65.0 in | Wt 215.0 lb

## 2016-12-01 DIAGNOSIS — Z6836 Body mass index (BMI) 36.0-36.9, adult: Secondary | ICD-10-CM

## 2016-12-01 DIAGNOSIS — E1159 Type 2 diabetes mellitus with other circulatory complications: Secondary | ICD-10-CM | POA: Diagnosis not present

## 2016-12-01 DIAGNOSIS — F339 Major depressive disorder, recurrent, unspecified: Secondary | ICD-10-CM

## 2016-12-01 DIAGNOSIS — I10 Essential (primary) hypertension: Secondary | ICD-10-CM | POA: Diagnosis not present

## 2016-12-01 DIAGNOSIS — N183 Chronic kidney disease, stage 3 (moderate): Secondary | ICD-10-CM | POA: Diagnosis not present

## 2016-12-01 DIAGNOSIS — E538 Deficiency of other specified B group vitamins: Secondary | ICD-10-CM

## 2016-12-01 DIAGNOSIS — E1122 Type 2 diabetes mellitus with diabetic chronic kidney disease: Secondary | ICD-10-CM | POA: Diagnosis not present

## 2016-12-01 DIAGNOSIS — E1139 Type 2 diabetes mellitus with other diabetic ophthalmic complication: Secondary | ICD-10-CM | POA: Diagnosis not present

## 2016-12-01 LAB — BAYER DCA HB A1C WAIVED: HB A1C: 6 % (ref <7.0)

## 2016-12-01 MED ORDER — CYANOCOBALAMIN 1000 MCG/ML IJ SOLN
1000.0000 ug | INTRAMUSCULAR | 11 refills | Status: DC
Start: 2016-12-01 — End: 2017-12-07

## 2016-12-01 NOTE — Progress Notes (Signed)
BP 139/80   Pulse (!) 108   Temp (!) 97.3 F (36.3 C) (Oral)   Ht 5\' 5"  (1.651 m)   Wt 215 lb (97.5 kg)   BMI 35.78 kg/m    Subjective:    Patient ID: Anna Dickerson, female    DOB: 1929-09-02, 83 y.o.   MRN: [id number]  HPI: Anna Dickerson is a 83 y.o. female presenting on 12/01/2016 for Diabetes (2 month follow up) and Hypertension   HPI Hypertension Patient is currently on clonidine and lisinopril and amlodipine, and their blood pressure today is 173/93 and recheck was 139/80. Patient denies any lightheadedness or dizziness. Patient denies headaches, blurred vision, chest pains, shortness of breath, or weakness. Denies any side effects from medication and is content with current medication.   Type 2 diabetes mellitus Patient comes in today for recheck of his diabetes. Patient has been currently taking trulicity and metformin. Patient is currently on an ACE inhibitor/ARB. Patient has not seen an ophthalmologist this year. Patient dennies any issues with their feet.   Depression recheck Patient is coming in for depression recheck and is currently on Effexor.  She says she has been doing very well and denies any major issues currently with it.  She denies any suicidal ideations or thoughts of hurting yourself and says she has good family support.  Vitamin B12 deficiency recheck Recent is coming in for recheck her vitamin B12 and denies any major symptoms from it.  Relevant past medical, surgical, family and social history reviewed and updated as indicated. Interim medical history since our last visit reviewed. Allergies and medications reviewed and updated.  Review of Systems  Constitutional: Negative for chills and fever.  Eyes: Negative for visual disturbance.  Respiratory: Negative for chest tightness and shortness of breath.   Cardiovascular: Negative for chest pain and leg swelling.  Genitourinary: Negative for difficulty urinating.  Musculoskeletal: Negative  for back pain and gait problem.  Skin: Negative for rash.  Neurological: Negative for light-headedness and headaches.  Psychiatric/Behavioral: Negative for agitation, behavioral problems, decreased concentration, dysphoric mood, self-injury, sleep disturbance and suicidal ideas. The patient is not nervous/anxious.   All other systems reviewed and are negative.   Per HPI unless specifically indicated above     Objective:    BP (!) 173/93   Pulse (!) 108   Temp (!) 97.3 F (36.3 C) (Oral)   Ht 5\' 5"  (1.651 m)   Wt 215 lb (97.5 kg)   BMI 35.78 kg/m   Wt Readings from Last 3 Encounters:  12/01/16 215 lb (97.5 kg)  09/30/16 217 lb (98.4 kg)  09/01/16 221 lb (100.2 kg)    Physical Exam  Constitutional: She is oriented to person, place, and time. She appears well-developed and well-nourished. No distress.  Eyes: Conjunctivae are normal.  Neck: Neck supple. No thyromegaly present.  Cardiovascular: Normal rate, regular rhythm, normal heart sounds and intact distal pulses.  No murmur heard. Pulmonary/Chest: Effort normal and breath sounds normal. No respiratory distress. She has no wheezes. She has no rales.  Musculoskeletal: Normal range of motion. She exhibits no edema (trace).  Lymphadenopathy:    She has no cervical adenopathy.  Neurological: She is alert and oriented to person, place, and time. Coordination normal.  Skin: Skin is warm and dry. No rash noted. She is not diaphoretic.  Psychiatric: She has a normal mood and affect. Her behavior is normal. Judgment normal. Her mood appears not anxious. She does not exhibit a depressed mood. She  expresses no suicidal ideation. She expresses no suicidal plans.  Nursing note and vitals reviewed.     Assessment & Plan:   Problem List Items Addressed This Visit      Cardiovascular and Mediastinum   Hypertension associated with diabetes ([place]) - Primary     Endocrine   DM (diabetes mellitus) ([place])   Relevant Orders   Bayer DCA Hb  A1c Waived (Completed)   CKD stage 3 due to type 2 diabetes mellitus (H[facility])     Other   Obesity   Depression, recurrent ([place])    Other Visit Diagnoses    Vitamin B12 deficiency       Relevant Medications   cyanocobalamin (,VITAMIN B-12,) 1000 MCG/ML injection       Follow up plan: Return in about 3 months (around 03/03/2017), or if symptoms worsen or fail to improve, for Hypertension follow-up.  Counseling provided for all of the vaccine components No orders of the defined types were placed in this encounter.   Caryl Pina, MD [facility] 12/01/2016, 11:52 AM

## 2016-12-05 ENCOUNTER — Ambulatory Visit: Payer: Medicare Other

## 2017-01-08 ENCOUNTER — Ambulatory Visit (INDEPENDENT_AMBULATORY_CARE_PROVIDER_SITE_OTHER): Payer: Medicare Other | Admitting: *Deleted

## 2017-01-08 VITALS — BP 148/77 | HR 79 | Temp 98.1°F | Ht 64.0 in | Wt 219.4 lb

## 2017-01-08 DIAGNOSIS — Z0000 Encounter for general adult medical examination without abnormal findings: Secondary | ICD-10-CM

## 2017-01-08 DIAGNOSIS — Z23 Encounter for immunization: Secondary | ICD-10-CM | POA: Diagnosis not present

## 2017-01-08 NOTE — Progress Notes (Signed)
Subjective:   Anna Dickerson is a 83 y.o. female who presents for Medicare Annual (Subsequent) preventive examination. Patient is currently retired. 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 She is widowed. She has 4 adult children. She has a granddaughter who currently lives with her. She does have any pets or rugs in the home. She states that her health is about the same as it was last year at this time. She has not had any recent hospitalizations or surgeries. Fall hazards were discussed with patient.  Review of Systems:   Cardiac Risk Factors include: advanced age (>45men, >65 women);hypertension;obesity (BMI >30kg/m2);diabetes mellitus     Objective:     BP (!) 149/75   Pulse 77   Temp 98.1 F (36.7 C) (Oral)   Ht 5\' 4"  (1.626 m)   Wt 219 lb 6.4 oz (99.5 kg)   BMI 37.66 kg/m    Advanced Directives 01/08/2017 05/13/2016 02/06/2016 03/22/2015 02/22/2015 02/19/2015 02/15/2015  Does Patient Have a Medical Advance Directive? Yes No Yes Yes Yes Yes No;Yes  Type of Academic librarian - Healthcare Power of Attorney Living will Living will Living will Living will  Does patient want to make changes to medical advance directive? - - No - Patient declined No - Patient declined - No - Patient declined -  Copy of [place] in Chart? No - copy requested - No - copy requested Yes Yes Yes Yes  Would patient like information on creating a medical advance directive? - Yes (MAU/Ambulatory/Procedural Areas - Information given) - No - patient declined information No - patient declined information No - patient declined information No - patient declined information    Tobacco Social History   Tobacco Use  Smoking Status Never Smoker  Smokeless Tobacco Never Used     Counseling given: Not Answered  Patient has never smoked  Clinical Intake:   Past Medical History:    Diagnosis Date  . B12 deficiency 02/08/2015  . Cancer [facility]) [id number]   breast  . Chronic kidney disease   . Diabetes mellitus   . DM (diabetes mellitus) ([place]) 12/31/2010  . HTN (hypertension) 12/31/2010  . Hypertension   . Iron deficiency 02/24/2012  . Macular degeneration 12/31/2010  . Macular degeneration, left eye   . OAB (overactive bladder)   . Obesity 12/31/2010  . Stage I left-sided Breast cancer 12/31/2010   Past Surgical History:  Procedure Laterality Date  . bilateral cataract with intraocular lens implant    . Bladder tacked-up    . BREAST SURGERY  left    . REPLACEMENT TOTAL KNEE BILATERAL Bilateral    TKR were ar different times about 2 yeras apart.  Marland Kitchen SKIN CANCER EXCISION     squamous cell right ear and calf right leg   Family History  Problem Relation Age of Onset  . Cancer Mother    Social History   Socioeconomic History  . Marital status: Married    Spouse name: None  . Number of children: 4  . Years of education: None  . Highest education level: None  Social Needs  . Financial resource strain: Not hard at all  . Food insecurity - worry: Never true  . Food insecurity - inability: Never true  . Transportation needs - medical: No  . Transportation needs - non-medical: No  Occupational History  . None  Tobacco Use  .  Smoking status: Never Smoker  . Smokeless tobacco: Never Used  Substance and Sexual Activity  . Alcohol use: No  . Drug use: No  . Sexual activity: No  Other Topics Concern  . None  Social History Narrative  . None    Outpatient Encounter Medications as of 01/08/2017  Medication Sig  . amLODipine (NORVASC) 10 MG tablet Take 1 Tablet by mouth once daily  . aspirin 81 MG tablet Take 81 mg by mouth daily.    Marland Kitchen atorvastatin (LIPITOR) 20 MG tablet TAKE 1 TABLET (20 MG TOTAL) BY MOUTH AT BEDTIME.  . cloNIDine (CATAPRES) 0.1 MG tablet Take 1 Tablet by mouth 2 times a day  . cyanocobalamin (,VITAMIN B-12,) 1000 MCG/ML injection  Inject 1 mL (1,000 mcg total) every 30 (thirty) days into the muscle.  . Dulaglutide (TRULICITY) 8.67 JQ/4.9EE SOPN Inject 0.75 mg into the skin once a week.  . fluticasone (FLONASE) 50 MCG/ACT nasal spray Place 1 spray into both nostrils 2 (two) times daily as needed for allergies or rhinitis.  . furosemide (LASIX) 20 MG tablet Take 1 tablet (20 mg total) by mouth daily.  Marland Kitchen LORazepam (ATIVAN) 0.5 MG tablet Take 1 Tablet by mouth 2 times a day as needed  . Melatonin 5 MG TABS Take 1 tablet (5 mg total) by mouth at bedtime.  . metFORMIN (GLUCOPHAGE) 1000 MG tablet Take 1 Tablet by mouth once daily with breakfast  . Multiple Vitamins-Minerals (PRESERVISION AREDS 2) CAPS Take 2 capsules by mouth 2 (two) times daily.  . Omega-3 Fatty Acids (FISH OIL) 1000 MG CAPS Take 1,000 mg by mouth 2 (two) times daily.  Marland Kitchen venlafaxine XR (EFFEXOR-XR) 150 MG 24 hr capsule Take 1 Capsule by mouth once daily with BREAKFAST  . Vitamin D, Ergocalciferol, (DRISDOL) 50000 units CAPS capsule Take 1 capsule (50,000 Units total) by mouth every 7 (seven) days.  . [DISCONTINUED] lisinopril (PRINIVIL,ZESTRIL) 10 MG tablet Take 1 tablet (10 mg total) by mouth daily.   No facility-administered encounter medications on file as of 01/08/2017.     Activities of Daily Living In your present state of health, do you have any difficulty performing the following activities: 01/08/2017 05/13/2016  Hearing? Tempie Donning  Comment Patient wears hearing aids  usually wears hearing aids but is not wearing today  Vision? Y Y  Comment Wears glasses and has macular degeneration  wears glasses and has magular degeneration  Difficulty concentrating or making decisions? N N  Walking or climbing stairs? Y N  Comment has trouble at times due to knee pain at time  -  Dressing or bathing? N N  Doing errands, shopping? N N  Preparing Food and eating ? N N  Using the Toilet? N N  In the past six months, have you accidently leaked urine? N Y  Do you have  problems with loss of bowel control? N N  Managing your Medications? N N  Managing your Finances? N N  Housekeeping or managing your Housekeeping? N N  Some recent data might be hidden  Patient does have hearing aids that she wears for decreased hearing.  Patient has trouble with vision due to macular degeneration.  Patient does have trouble walking at times and climbing steps due to knee pain   Patient Care Team: Dettinger, Fransisca Kaufmann, MD as PCP - General (Family Medicine) Sherlynn Stalls, MD as Consulting Physician (Ophthalmology) Rolm Bookbinder, MD as Consulting Physician (Dermatology) Fran Lowes, MD as Consulting Physician (Nephrology) Sherlynn Stalls, MD as Consulting  Physician (Ophthalmology)    Assessment:   This is a routine wellness examination for [place].  Exercise Activities and Dietary recommendations Current Exercise Habits: Home exercise routine, Type of exercise: walking, Time (Minutes): 30, Frequency (Times/Week): 4, Weekly Exercise (Minutes/Week): 120, Intensity: Mild, Exercise limited by: orthopedic condition(s)  Goals    . Exercise 3x per week (30 min per time)       Fall Risk Fall Risk  01/08/2017 12/01/2016 09/30/2016 09/01/2016 07/31/2016  Falls in the past year? Yes No Yes Yes Yes  Number falls in past yr: 1 - 2 or more 1 1  Injury with Fall? No - Yes Yes Yes  Comment - - - muscular muscular  Risk Factor Category  - - - - -  Risk for fall due to : - - - - -  Follow up Falls prevention discussed - - - -   Is the patient's home free of loose throw rugs in walkways, pet beds, electrical cords, etc?   yes      Adequate lighting?   yes Discussed fall risk and hazards with patient.    Depression Screen PHQ 2/9 Scores 01/08/2017 12/01/2016 09/30/2016 09/01/2016  PHQ - 2 Score 0 1 2 2   PHQ- 9 Score - - 4 6     Cognitive Function MMSE - Mini Mental State Exam 01/08/2017 05/13/2016  Orientation to time 4 4  Orientation to Place 4 5  Registration 3 3    Attention/ Calculation 3 5  Recall 2 2  Language- name 2 objects 2 2  Language- repeat 1 1  Language- follow 3 step command 3 3  Language- read & follow direction 1 1  Write a sentence 1 1  Copy design 1 1  Total score 25 28    Patient scored a 25 out of 30    Immunization History  Administered Date(s) Administered  . Influenza, High Dose Seasonal PF 01/08/2017  . Influenza,inj,Quad PF,6+ Mos 02/01/2014, 12/03/2015  . Pneumococcal Conjugate-13 01/08/2017  . Tdap 05/13/2016  . Zoster Recombinat (Shingrix) 05/13/2016    Qualifies for Shingles Vaccine?Yes patient is due for second shingrix and she will receive at her February appointment.   Screening Tests Health Maintenance  Topic Date Due  . OPHTHALMOLOGY EXAM  04/02/2016  . FOOT EXAM  08/26/2016  . PNA vac Low Risk Adult (1 of 2 - PCV13) 12/01/2017 (Originally 12/05/1996)  . HEMOGLOBIN A1C  05/31/2017  . DEXA SCAN  02/04/2018  . TETANUS/TDAP  05/14/2026    Cancer Screenings: Lung: Low Dose CT Chest recommended if Age 35-80 years, 30 pack-year currently smoking OR have quit w/in 15years. Patient does not qualify. Breast:  Up to date on Mammogram? Yes   Up to date of Bone Density/Dexa? Yes Colorectal:    Additional Screenings Hepatitis B/HIV/Syphillis: Hepatitis C Screening:      Plan:  Patient is scheduled to follow up with Dr. Warrick Parisian in February Patient will receive 2nd Shingrix at that time Patient will keep follow up appointment with [facility] and Oncologist  I have personally reviewed and noted the following in the patient's chart:   . Medical and social history . Use of alcohol, tobacco or illicit drugs  . Current medications and supplements . Functional ability and status . Nutritional status . Physical activity . Advanced directives . List of other physicians . Hospitalizations, surgeries, and ER visits in previous 12 months . Vitals . Screenings to include cognitive, depression, and  falls . Referrals and appointments  In addition,  I have reviewed and discussed with patient certain preventive protocols, quality metrics, and best practice recommendations. A written personalized care plan for preventive services as well as general preventive health recommendations were provided to patient.     Gareth Morgan, LPN  [id number]   I have reviewed and agree with the above AWV documentation.   Evelina Dun, FNP

## 2017-01-08 NOTE — Patient Instructions (Signed)
  Anna Dickerson , Thank you for taking time to come for your Medicare Wellness Visit. I appreciate your ongoing commitment to your health goals. Please review the following plan we discussed and let me know if I can assist you in the future.   These are the goals we discussed: Goals    . Exercise 3x per week (30 min per time)       This is a list of the screening recommended for you and due dates:  Health Maintenance  Topic Date Due  . Eye exam for diabetics  04/02/2016  . Complete foot exam   08/26/2016  . Pneumonia vaccines (1 of 2 - PCV13) 12/01/2017*  . Hemoglobin A1C  05/31/2017  . DEXA scan (bone density measurement)  02/04/2018  . Tetanus Vaccine  05/14/2026  *Topic was postponed. The date shown is not the original due date.   Pneumonia vaccine given today  Flu Vaccine given today  Shingrix to be given in February at appointment

## 2017-01-15 ENCOUNTER — Other Ambulatory Visit: Payer: Medicare Other

## 2017-02-02 ENCOUNTER — Other Ambulatory Visit: Payer: Self-pay | Admitting: Family Medicine

## 2017-02-02 NOTE — Telephone Encounter (Signed)
Last Vit D 05/13/16  38.3 

## 2017-02-03 ENCOUNTER — Encounter (HOSPITAL_COMMUNITY): Payer: Self-pay | Admitting: Oncology

## 2017-02-03 ENCOUNTER — Other Ambulatory Visit: Payer: Self-pay

## 2017-02-03 ENCOUNTER — Inpatient Hospital Stay (HOSPITAL_COMMUNITY): Payer: Medicare Other

## 2017-02-03 ENCOUNTER — Inpatient Hospital Stay (HOSPITAL_COMMUNITY): Payer: Medicare Other | Attending: Oncology | Admitting: Oncology

## 2017-02-03 DIAGNOSIS — E669 Obesity, unspecified: Secondary | ICD-10-CM | POA: Diagnosis not present

## 2017-02-03 DIAGNOSIS — Z170 Estrogen receptor positive status [ER+]: Principal | ICD-10-CM

## 2017-02-03 DIAGNOSIS — K5900 Constipation, unspecified: Secondary | ICD-10-CM | POA: Diagnosis not present

## 2017-02-03 DIAGNOSIS — E538 Deficiency of other specified B group vitamins: Secondary | ICD-10-CM

## 2017-02-03 DIAGNOSIS — N189 Chronic kidney disease, unspecified: Secondary | ICD-10-CM | POA: Diagnosis not present

## 2017-02-03 DIAGNOSIS — Z9223 Personal history of estrogen therapy: Secondary | ICD-10-CM | POA: Diagnosis not present

## 2017-02-03 DIAGNOSIS — Z853 Personal history of malignant neoplasm of breast: Secondary | ICD-10-CM | POA: Insufficient documentation

## 2017-02-03 DIAGNOSIS — D509 Iron deficiency anemia, unspecified: Secondary | ICD-10-CM | POA: Insufficient documentation

## 2017-02-03 DIAGNOSIS — H3530 Unspecified macular degeneration: Secondary | ICD-10-CM | POA: Insufficient documentation

## 2017-02-03 DIAGNOSIS — C50912 Malignant neoplasm of unspecified site of left female breast: Secondary | ICD-10-CM

## 2017-02-03 DIAGNOSIS — E1122 Type 2 diabetes mellitus with diabetic chronic kidney disease: Secondary | ICD-10-CM | POA: Insufficient documentation

## 2017-02-03 DIAGNOSIS — I129 Hypertensive chronic kidney disease with stage 1 through stage 4 chronic kidney disease, or unspecified chronic kidney disease: Secondary | ICD-10-CM | POA: Diagnosis not present

## 2017-02-03 DIAGNOSIS — Z85828 Personal history of other malignant neoplasm of skin: Secondary | ICD-10-CM | POA: Diagnosis not present

## 2017-02-03 DIAGNOSIS — Z923 Personal history of irradiation: Secondary | ICD-10-CM | POA: Insufficient documentation

## 2017-02-03 DIAGNOSIS — E611 Iron deficiency: Secondary | ICD-10-CM

## 2017-02-03 DIAGNOSIS — Z809 Family history of malignant neoplasm, unspecified: Secondary | ICD-10-CM | POA: Insufficient documentation

## 2017-02-03 DIAGNOSIS — N3281 Overactive bladder: Secondary | ICD-10-CM

## 2017-02-03 DIAGNOSIS — Z79899 Other long term (current) drug therapy: Secondary | ICD-10-CM | POA: Diagnosis not present

## 2017-02-03 LAB — CBC WITH DIFFERENTIAL/PLATELET
Basophils Absolute: 0 10*3/uL (ref 0.0–0.1)
Basophils Relative: 0 %
EOS PCT: 4 %
Eosinophils Absolute: 0.4 10*3/uL (ref 0.0–0.7)
HEMATOCRIT: 37.6 % (ref 36.0–46.0)
Hemoglobin: 12 g/dL (ref 12.0–15.0)
LYMPHS ABS: 2.6 10*3/uL (ref 0.7–4.0)
LYMPHS PCT: 28 %
MCH: 29.3 pg (ref 26.0–34.0)
MCHC: 31.9 g/dL (ref 30.0–36.0)
MCV: 91.7 fL (ref 78.0–100.0)
MONO ABS: 0.5 10*3/uL (ref 0.1–1.0)
MONOS PCT: 6 %
NEUTROS ABS: 5.7 10*3/uL (ref 1.7–7.7)
Neutrophils Relative %: 62 %
PLATELETS: 220 10*3/uL (ref 150–400)
RBC: 4.1 MIL/uL (ref 3.87–5.11)
RDW: 13.6 % (ref 11.5–15.5)
WBC: 9.3 10*3/uL (ref 4.0–10.5)

## 2017-02-03 LAB — COMPREHENSIVE METABOLIC PANEL
ALT: 16 U/L (ref 14–54)
AST: 23 U/L (ref 15–41)
Albumin: 3.9 g/dL (ref 3.5–5.0)
Alkaline Phosphatase: 76 U/L (ref 38–126)
Anion gap: 12 (ref 5–15)
BILIRUBIN TOTAL: 0.5 mg/dL (ref 0.3–1.2)
BUN: 27 mg/dL — AB (ref 6–20)
CHLORIDE: 100 mmol/L — AB (ref 101–111)
CO2: 27 mmol/L (ref 22–32)
Calcium: 9.5 mg/dL (ref 8.9–10.3)
Creatinine, Ser: 1.56 mg/dL — ABNORMAL HIGH (ref 0.44–1.00)
GFR calc non Af Amer: 29 mL/min — ABNORMAL LOW (ref >60)
GFR, EST AFRICAN AMERICAN: 34 mL/min — AB (ref >60)
Glucose, Bld: 221 mg/dL — ABNORMAL HIGH (ref 65–99)
POTASSIUM: 4.2 mmol/L (ref 3.5–5.1)
Sodium: 139 mmol/L (ref 135–145)
TOTAL PROTEIN: 7.9 g/dL (ref 6.5–8.1)

## 2017-02-03 LAB — IRON AND TIBC
IRON: 93 ug/dL (ref 28–170)
SATURATION RATIOS: 27 % (ref 10.4–31.8)
TIBC: 347 ug/dL (ref 250–450)
UIBC: 254 ug/dL

## 2017-02-03 LAB — FERRITIN: FERRITIN: 51 ng/mL (ref 11–307)

## 2017-02-03 NOTE — Progress Notes (Signed)
Dickerson, Anna Kaufmann, MD [place] [address]  Malignant neoplasm of left breast in female, estrogen receptor positive, unspecified site of breast ([place]) - Plan: MM SCREENING BREAST TOMO BILATERAL, CBC with Differential/Platelet, Comprehensive metabolic panel  CURRENT THERAPY: Surveillance per NCCN guidelines and monthly B12 injections.  INTERVAL HISTORY: Anna Dickerson 84 y.o. female returns for followup of Stage I (T1A NO MO) left sided breast invasive carcinoma, status post 5 years worth of tamoxifen, lumpectomy, and adjuvant radiation therapy. AND B12 deficiency, on monthly B12 injections. AND Iron deficiency anemia  Patient states today she is doing well.  Most recent mammogram is from September 2018 as BI-RADS Category 2: Benign. She continues to conduct self breast exams does not report any abnormal findings.  She denies any blood in stool or dark stools.  She continues to get her prescription for B12 injections through her primary care provider monthly.  Her daughter gives them to her at home.  She notes her blood pressure to be elevated today.  She checks it daily and it has remained elevated for the past several months.  She is working with her kidney doctor with several different medications one which she began on Sunday.  This appears to be Coreg 12.5 mg tablets daily.  She continues her amlodipine daily.   She denies any symptoms from high blood pressure.  She notes mild constipation daily.  She is currently not taking any OTC medication.  Review of Systems  Constitutional: Negative.  Negative for chills, fever and weight loss.  HENT: Negative.   Eyes: Negative.   Respiratory: Negative.  Negative for cough.   Cardiovascular: Negative.  Negative for chest pain.  Gastrointestinal: Negative.  Negative for constipation, diarrhea, nausea and vomiting.  Genitourinary: Negative.   Musculoskeletal: Negative.   Skin: Negative.   Neurological:  Negative.  Negative for weakness.  Endo/Heme/Allergies: Negative.   Psychiatric/Behavioral: Negative.     Past Medical History:  Diagnosis Date  . B12 deficiency 02/08/2015  . Cancer [facility]) [id number]   breast  . Chronic kidney disease   . Diabetes mellitus   . DM (diabetes mellitus) ([place]) 12/31/2010  . HTN (hypertension) 12/31/2010  . Hypertension   . Iron deficiency 02/24/2012  . Macular degeneration 12/31/2010  . Macular degeneration, left eye   . OAB (overactive bladder)   . Obesity 12/31/2010  . Stage I left-sided Breast cancer 12/31/2010    Past Surgical History:  Procedure Laterality Date  . bilateral cataract with intraocular lens implant    . Bladder tacked-up    . BREAST SURGERY  left    . REPLACEMENT TOTAL KNEE BILATERAL Bilateral    TKR were ar different times about 2 yeras apart.  Marland Kitchen SKIN CANCER EXCISION     squamous cell right ear and calf right leg    Family History  Problem Relation Age of Onset  . Cancer Mother     Social History   Socioeconomic History  . Marital status: Married    Spouse name: None  . Number of children: 4  . Years of education: None  . Highest education level: None  Social Needs  . Financial resource strain: Not hard at all  . Food insecurity - worry: Never true  . Food insecurity - inability: Never true  . Transportation needs - medical: No  . Transportation needs - non-medical: No  Occupational History  . None  Tobacco Use  . Smoking status: Never Smoker  .  Smokeless tobacco: Never Used  Substance and Sexual Activity  . Alcohol use: No  . Drug use: No  . Sexual activity: No  Other Topics Concern  . None  Social History Narrative  . None     PHYSICAL EXAMINATION  ECOG PERFORMANCE STATUS: 1 - Symptomatic but completely ambulatory  Vitals:   02/03/17 1011  BP: (!) 194/83  Pulse: 92  Resp: 18  Temp: 97.7 F (36.5 C)  SpO2: 99%     GENERAL:alert, no distress, well nourished, well developed, comfortable,  cooperative, obese, smiling and unaccompanied  SKIN: skin color, texture, turgor are normal, no rashes or significant lesions HEAD: Normocephalic, No masses, lesions, tenderness or abnormalities EYES: normal, EOMI, Conjunctiva are pink and non-injected EARS: External ears normal OROPHARYNX:lips, buccal mucosa, and tongue normal and mucous membranes are moist  NECK: supple, no adenopathy, trachea midline LYMPH:  no palpable lymphadenopathy, no hepatosplenomegaly BREAST:right breast normal without mass, skin or nipple changes or axillary nodes, post-lumpectomy site well healed and free of suspicious changes.  Chronic B/L nipple inversion LUNGS: clear to auscultation and percussion HEART: regular rate & rhythm, no murmurs, no gallops, S1 normal and S2 normal ABDOMEN:abdomen soft, non-tender, obese, normal bowel sounds and no masses or organomegaly BACK: Back symmetric, no curvature., No CVA tenderness EXTREMITIES:less then 2 second capillary refill, no joint deformities, effusion, or inflammation, no edema, no skin discoloration, no cyanosis  NEURO: alert & oriented x 3 with fluent speech, no focal motor/sensory deficits, gait normal   LABORATORY DATA: CBC    Component Value Date/Time   WBC 9.3 02/03/2017 0935   RBC 4.10 02/03/2017 0935   HGB 12.0 02/03/2017 0935   HCT 37.6 02/03/2017 0935   PLT 220 02/03/2017 0935   MCV 91.7 02/03/2017 0935   MCH 29.3 02/03/2017 0935   MCHC 31.9 02/03/2017 0935   RDW 13.6 02/03/2017 0935   LYMPHSABS 2.6 02/03/2017 0935   MONOABS 0.5 02/03/2017 0935   EOSABS 0.4 02/03/2017 0935   BASOSABS 0.0 02/03/2017 0935      Chemistry      Component Value Date/Time   NA 139 02/03/2017 0935   NA 143 09/16/2016 1001   K 4.2 02/03/2017 0935   CL 100 (L) 02/03/2017 0935   CO2 27 02/03/2017 0935   BUN 27 (H) 02/03/2017 0935   BUN 32 (H) 09/16/2016 1001   CREATININE 1.56 (H) 02/03/2017 0935      Component Value Date/Time   CALCIUM 9.5 02/03/2017 0935    ALKPHOS 76 02/03/2017 0935   AST 23 02/03/2017 0935   ALT 16 02/03/2017 0935   BILITOT 0.5 02/03/2017 0935   BILITOT 0.2 09/16/2016 1001      Lab Results  Component Value Date   IRON 93 02/03/2017   TIBC 347 02/03/2017   FERRITIN 51 02/03/2017   Lab Results  Component Value Date   VITAMINB12 240 02/06/2016    PENDING LABS:   RADIOGRAPHIC STUDIES:  No results found.   PATHOLOGY:    ASSESSMENT AND PLAN:  Stage I left-sided Breast cancer Stage I (T1A N0 M0) left sided breast carcinoma, invasive disease, associated with some DCIS, status post lumpectomy, followed her radiation therapy was surgery on August 2007. She had an ER positive tumor and 93% PR +90% HER-2/neu negative, Ki-67 marker low 8%, high grade DCIS however was seen and a little microinvasion was also found. The maximum size of the invasive compound was 4 mm. She is status post 5 years of tamoxifen 20 mg daily  in 2012 (starting on 12/23/2005). She tolerated this well.   Labs today: CBC diff, CMET, Iron studies, ferritin.  Iron stores and Ferritin pending during dictation.  I personally reviewed and went over laboratory results with the patient.  The results are noted within this dictation.  Labs in 6 months: CBC diff, CMET, and ferritin.  Hypertension: Patient's blood pressure significantly elevated today.  We will let her continue follow-up and medication managment with her nephrologist.  On initial presentation BP 194/83. On recheck patient's blood pressure was 181/80.   Constipation: Recommend OTC MiraLAX in the morning.  Patient states she thinks she already has this at home.  I personally reviewed and went over radiographic studies with the patient.  Results are dictated in this note.  Her mammogram is up-to-date and was last performed in September 2018 indicating BI-RADS 2: Benign.  Order placed for mammogram in September 2019.  Return in 6 months for follow-up and breast exam.      ORDERS PLACED FOR  THIS ENCOUNTER: Orders Placed This Encounter  Procedures  . MM SCREENING BREAST TOMO BILATERAL  . CBC with Differential/Platelet  . Comprehensive metabolic panel    MEDICATIONS PRESCRIBED THIS ENCOUNTER: No orders of the defined types were placed in this encounter.   THERAPY PLAN:  Continue B12 injections monthly with primary care provider.  NCCN guidelines recommends the following surveillance for invasive breast cancer (2.2017):  A. History and Physical exam 1-4 times per year as clinically appropriate for 5 years, then annually.  B. Periodic screening for changes in family history and referral to genetics counseling as indicated  C. Educate, monitor, and refer to lymphedema management.  D. Mammography every 12 months  E. Routine imaging of reconstructed breast is not indicated.  F. In the absence of clinical signs and symptoms suggestive of recurrent disease, there is no indication for laboratory or imaging studies for metastases screening.  G. Women on Tamoxifen: annual gynecologic assessment every 12 months if uterus is present.  H. Women on aromatase inhibitor or who experience ovarian failure secondary to treatment should have monitoring of bone health with a bone mineral density determination at baseline and periodically thereafter.  I. Assess and encourage adherence to adjuvant endocrine therapy.  J. Evidence suggests that active lifestyle, healthy diet, limited alcohol intake, and achieving and maintaining an ideal body weight (20-25 BMI) may lead to optimal breast cancer outcomes.   All questions were answered. The patient knows to call the clinic with any problems, questions or concerns. We can certainly see the patient much sooner if necessary.  This note is electronically signed by: Jacquelin Hawking, NP 02/03/2017 1:43 PM

## 2017-02-03 NOTE — Assessment & Plan Note (Addendum)
Stage I (T1A N0 M0) left sided breast carcinoma, invasive disease, associated with some DCIS, status post lumpectomy, followed her radiation therapy was surgery on August 2007. She had an ER positive tumor and 93% PR +90% HER-2/neu negative, Ki-67 marker low 8%, high grade DCIS however was seen and a little microinvasion was also found. The maximum size of the invasive compound was 4 mm. She is status post 5 years of tamoxifen 20 mg daily in 2012 (starting on 12/23/2005). She tolerated this well.   Labs today: CBC diff, CMET, Iron studies, ferritin.  Iron stores and Ferritin pending during dictation.  I personally reviewed and went over laboratory results with the patient.  The results are noted within this dictation.  Labs in 6 months: CBC diff, CMET, and ferritin.  Hypertension: Patient's blood pressure significantly elevated today.  We will let her continue follow-up and medication managment with her nephrologist.  On initial presentation BP 194/83. On recheck patient's blood pressure was 181/80.   Constipation: Recommend OTC MiraLAX in the morning.  Patient states she thinks she already has this at home.  I personally reviewed and went over radiographic studies with the patient.  Results are dictated in this note.  Her mammogram is up-to-date and was last performed in September 2018 indicating BI-RADS 2: Benign.  Order placed for mammogram in September 2019.  Return in 6 months for follow-up and breast exam.

## 2017-02-09 ENCOUNTER — Other Ambulatory Visit: Payer: Self-pay

## 2017-02-09 ENCOUNTER — Other Ambulatory Visit: Payer: Self-pay | Admitting: Family Medicine

## 2017-02-09 DIAGNOSIS — I10 Essential (primary) hypertension: Secondary | ICD-10-CM

## 2017-02-09 MED ORDER — METFORMIN HCL 1000 MG PO TABS: ORAL_TABLET | ORAL | 0 refills | Status: DC

## 2017-02-10 ENCOUNTER — Ambulatory Visit: Payer: Medicare Other

## 2017-02-16 ENCOUNTER — Other Ambulatory Visit: Payer: Self-pay | Admitting: Family Medicine

## 2017-02-16 DIAGNOSIS — F419 Anxiety disorder, unspecified: Principal | ICD-10-CM

## 2017-02-16 DIAGNOSIS — F329 Major depressive disorder, single episode, unspecified: Secondary | ICD-10-CM

## 2017-03-02 ENCOUNTER — Other Ambulatory Visit: Payer: Self-pay | Admitting: Family Medicine

## 2017-03-02 NOTE — Telephone Encounter (Signed)
Last Vit D 05/13/16  38.3 

## 2017-03-05 ENCOUNTER — Ambulatory Visit: Payer: Medicare Other | Admitting: Family Medicine

## 2017-03-05 ENCOUNTER — Encounter: Payer: Self-pay | Admitting: Family Medicine

## 2017-03-05 VITALS — BP 161/79 | HR 87 | Temp 97.0°F | Ht 64.0 in | Wt 219.0 lb

## 2017-03-05 DIAGNOSIS — I10 Essential (primary) hypertension: Secondary | ICD-10-CM

## 2017-03-05 DIAGNOSIS — L989 Disorder of the skin and subcutaneous tissue, unspecified: Secondary | ICD-10-CM | POA: Diagnosis not present

## 2017-03-05 DIAGNOSIS — N183 Chronic kidney disease, stage 3 (moderate): Secondary | ICD-10-CM | POA: Diagnosis not present

## 2017-03-05 DIAGNOSIS — E1159 Type 2 diabetes mellitus with other circulatory complications: Secondary | ICD-10-CM

## 2017-03-05 DIAGNOSIS — E1122 Type 2 diabetes mellitus with diabetic chronic kidney disease: Secondary | ICD-10-CM

## 2017-03-05 DIAGNOSIS — F339 Major depressive disorder, recurrent, unspecified: Secondary | ICD-10-CM | POA: Diagnosis not present

## 2017-03-05 DIAGNOSIS — E785 Hyperlipidemia, unspecified: Secondary | ICD-10-CM | POA: Diagnosis not present

## 2017-03-05 DIAGNOSIS — E1139 Type 2 diabetes mellitus with other diabetic ophthalmic complication: Secondary | ICD-10-CM | POA: Diagnosis not present

## 2017-03-05 DIAGNOSIS — E1169 Type 2 diabetes mellitus with other specified complication: Secondary | ICD-10-CM | POA: Diagnosis not present

## 2017-03-05 MED ORDER — FLUTICASONE PROPIONATE 50 MCG/ACT NA SUSP: 1.0000 | Freq: Two times a day (BID) | NASAL | 6 refills | Status: DC | PRN

## 2017-03-05 MED ORDER — CEPHALEXIN 500 MG PO CAPS: 500.0000 mg | ORAL_CAPSULE | Freq: Four times a day (QID) | ORAL | 0 refills | Status: DC

## 2017-03-05 MED ORDER — MELATONIN 5 MG PO TABS: 1.5000 | ORAL_TABLET | Freq: Every day | ORAL | 6 refills | Status: AC

## 2017-03-05 NOTE — Progress Notes (Signed)
BP (!) 170/92   Pulse 87   Temp (!) 97 F (36.1 C) (Oral)   Ht _0  (1.626 m)   Wt 219 lb (99.3 kg)   BMI 37.59 kg/m    Subjective:    Patient ID: Anna Dickerson, female    DOB: 23-Apr-1929, 84 y.o.   MRN: [id number]  HPI: Anna Dickerson is a 84 y.o. female presenting on 03/05/2017 for Diabetes; Hypertension; and Lesion on scalp   HPI Type 2 diabetes mellitus Patient comes in today for recheck of his diabetes. Patient has been currently taking metformin and Trulicity. Patient is currently on an ACE inhibitor/ARB because of renal issues. Patient has not seen an ophthalmologist this year. Patient denies any issues with their feet.  Has been diagnosed with some retinopathy in her eyes and stage III CKD, she has an ophthalmologist and an nephrologist that she sees.  Hypertension Patient is currently on amlodipine and Coreg, and their blood pressure today is 170/92, it has been historically difficult to try to control her blood pressures and right now that major change her medications is been nephrology and we will defer to them to continue to manage. Patient denies any lightheadedness or dizziness. Patient denies headaches, blurred vision, chest pains, shortness of breath, or weakness. Denies any side effects from medication and is content with current medication.   Hyperlipidemia Patient is coming in for recheck of his hyperlipidemia. The patient is currently taking fish oil and Lipitor. They deny any issues with myalgias or history of liver damage from it. They deny any focal numbness or weakness or chest pain.   Depression" Patient is coming in today for depression recheck.  She says she is doing very well on her current medication of the Effexor and denies any suicidal ideations and is very happy with the medication currently.  Nonhealing skin lesion  patient is coming in with complaints of a nonhealing skin lesion on the top of her scalp that actually has 3 small lesions and  she said sometimes it will get scratched up and then will bleed a little bit but has not noticed any redness or warmth or drainage.  She says she has had these before and been seen by dermatology before and had them removed but they are coming back again this time.  She denies any fevers or chills or pain associated with them.  Relevant past medical, surgical, family and social history reviewed and updated as indicated. Interim medical history since our last visit reviewed. Allergies and medications reviewed and updated.  Review of Systems  Constitutional: Negative for chills and fever.  Eyes: Negative for visual disturbance.  Respiratory: Negative for chest tightness and shortness of breath.   Cardiovascular: Negative for chest pain and leg swelling.  Musculoskeletal: Negative for back pain and gait problem.  Skin: Positive for color change. Negative for rash.  Neurological: Negative for dizziness, light-headedness and headaches.  Psychiatric/Behavioral: Positive for dysphoric mood. Negative for agitation, behavioral problems, self-injury, sleep disturbance and suicidal ideas. The patient is nervous/anxious.   All other systems reviewed and are negative.   Per HPI unless specifically indicated above   Allergies as of 03/05/2017      Reactions   Sulfa Antibiotics    Mouth ulcers      Medication List        Accurate as of 03/05/17 11:43 AM. Always use your most recent med list.          amLODipine 10 MG tablet Commonly  known as:  NORVASC Take 1 Tablet by mouth once daily   aspirin 81 MG tablet Take 81 mg by mouth daily.   atorvastatin 20 MG tablet Commonly known as:  LIPITOR TAKE 1 TABLET (20 MG TOTAL) BY MOUTH AT BEDTIME.   carvedilol 12.5 MG tablet Commonly known as:  COREG Take 12.5 mg by mouth 2 (two) times daily with a meal.   cephALEXin 500 MG capsule Commonly known as:  KEFLEX Take 1 capsule (500 mg total) by mouth 4 (four) times daily.   cyanocobalamin 1000  MCG/ML injection Commonly known as:  (VITAMIN B-12) Inject 1 mL (1,000 mcg total) every 30 (thirty) days into the muscle.   Fish Oil 1000 MG Caps Take 1,000 mg by mouth 2 (two) times daily.   fluticasone 50 MCG/ACT nasal spray Commonly known as:  FLONASE Place 1 spray into both nostrils 2 (two) times daily as needed for allergies or rhinitis.   furosemide 20 MG tablet Commonly known as:  LASIX Take 1 tablet (20 mg total) by mouth daily.   LORazepam 0.5 MG tablet Commonly known as:  ATIVAN Take 1 Tablet by mouth 2 times a day as needed   Melatonin 5 MG Tabs Take 1.5 tablets (7.5 mg total) by mouth at bedtime.   metFORMIN 1000 MG tablet Commonly known as:  GLUCOPHAGE Take 1 Tablet by mouth once daily with breakfast   PRESERVISION AREDS 2 Caps Take 2 capsules by mouth 2 (two) times daily.   TRULICITY 9.51 OA/4.1YS Sopn Generic drug:  Dulaglutide INJECT 0.75 MG ONCE A WEEK   venlafaxine XR 150 MG 24 hr capsule Commonly known as:  EFFEXOR-XR TAKE 1 CAPSULE ONCE DAILY WITH BREAKFAST   Vitamin D (Ergocalciferol) 50000 units Caps capsule Commonly known as:  DRISDOL TAKE 1 CAPSULE ONCE A WEEK          Objective:    BP (!) 170/92   Pulse 87   Temp (!) 97 F (36.1 C) (Oral)   Ht _0  (1.626 m)   Wt 219 lb (99.3 kg)   BMI 37.59 kg/m   Wt Readings from Last 3 Encounters:  03/05/17 219 lb (99.3 kg)  02/03/17 221 lb (100.2 kg)  01/08/17 219 lb 6.4 oz (99.5 kg)    Physical Exam  Constitutional: She is oriented to person, place, and time. She appears well-developed and well-nourished. No distress.  Eyes: Conjunctivae are normal.  Neck: Neck supple. No thyromegaly present.  Cardiovascular: Normal rate, regular rhythm, normal heart sounds and intact distal pulses.  No murmur heard. Pulmonary/Chest: Effort normal and breath sounds normal. No respiratory distress. She has no wheezes.  Musculoskeletal: Normal range of motion. She exhibits no edema or tenderness.    Lymphadenopathy:    She has no cervical adenopathy.  Neurological: She is alert and oriented to person, place, and time. Coordination normal.  Skin: Skin is warm and dry. No rash noted. She is not diaphoretic.  3 elevated papular lesions that are filled with thick cheesy white semisolid material.,  Nontender to palpation, slightly pigmented on the top of them.  Psychiatric: Her behavior is normal. Judgment normal. Her mood appears anxious. She exhibits a depressed mood. She expresses no suicidal ideation. She expresses no suicidal plans.  Nursing note and vitals reviewed.       Assessment & Plan:   Problem List Items Addressed This Visit      Cardiovascular and Mediastinum   Hypertension associated with diabetes ([place])   Relevant Orders   CMP14+EGFR  Endocrine   DM (diabetes mellitus) ([place]) - Primary   Relevant Orders   Bayer DCA Hb A1c Waived   CMP14+EGFR   CKD stage 3 due to type 2 diabetes mellitus ([facility])   Relevant Orders   CMP14+EGFR     Other   Depression, recurrent ([facility])   Relevant Medications   Melatonin 5 MG TABS    Other Visit Diagnoses    Dyslipidemia associated with type 2 diabetes mellitus ([place])       Relevant Orders   Lipid panel   Non-healing skin lesion       Mild erythema, will give antibiotic, recommended for her to go back to dermatology   Relevant Medications   cephALEXin (KEFLEX) 500 MG capsule       Follow up plan: Return in about 3 months (around 06/02/2017), or if symptoms worsen or fail to improve, for Recheck diabetes and hypertension.  Counseling provided for all of the vaccine components Orders Placed This Encounter  Procedures  . Bayer DCA Hb A1c Waived  . CMP14+EGFR  . Lipid panel    Caryl Pina, MD [facility] 03/05/2017, 11:43 AM

## 2017-03-09 ENCOUNTER — Other Ambulatory Visit: Payer: Self-pay | Admitting: Family Medicine

## 2017-03-09 DIAGNOSIS — I10 Essential (primary) hypertension: Secondary | ICD-10-CM

## 2017-03-23 ENCOUNTER — Other Ambulatory Visit: Payer: Self-pay | Admitting: Family Medicine

## 2017-03-23 DIAGNOSIS — E785 Hyperlipidemia, unspecified: Secondary | ICD-10-CM

## 2017-03-23 DIAGNOSIS — F32A Depression, unspecified: Secondary | ICD-10-CM

## 2017-03-23 DIAGNOSIS — F329 Major depressive disorder, single episode, unspecified: Secondary | ICD-10-CM

## 2017-03-23 DIAGNOSIS — F419 Anxiety disorder, unspecified: Secondary | ICD-10-CM

## 2017-03-30 ENCOUNTER — Other Ambulatory Visit: Payer: Self-pay | Admitting: Family Medicine

## 2017-04-01 ENCOUNTER — Other Ambulatory Visit: Payer: Medicare Other

## 2017-04-22 ENCOUNTER — Other Ambulatory Visit: Payer: Self-pay | Admitting: Family Medicine

## 2017-04-22 DIAGNOSIS — I10 Essential (primary) hypertension: Secondary | ICD-10-CM

## 2017-04-22 NOTE — Telephone Encounter (Signed)
Last seen 03/05/17  Last Vit D 05/13/16   38.3

## 2017-04-25 ENCOUNTER — Other Ambulatory Visit: Payer: Self-pay | Admitting: Family Medicine

## 2017-05-19 ENCOUNTER — Other Ambulatory Visit: Payer: Self-pay | Admitting: Family Medicine

## 2017-05-19 DIAGNOSIS — I10 Essential (primary) hypertension: Secondary | ICD-10-CM

## 2017-05-27 ENCOUNTER — Other Ambulatory Visit: Payer: Self-pay | Admitting: Family Medicine

## 2017-05-27 NOTE — Telephone Encounter (Signed)
Last seen 03/05/17  Dr Dettinger

## 2017-06-04 ENCOUNTER — Encounter: Payer: Self-pay | Admitting: Family Medicine

## 2017-06-04 ENCOUNTER — Ambulatory Visit: Payer: Medicare Other | Admitting: Family Medicine

## 2017-06-04 VITALS — BP 149/78 | HR 80 | Temp 97.0°F | Ht 64.0 in | Wt 230.0 lb

## 2017-06-04 DIAGNOSIS — N183 Chronic kidney disease, stage 3 unspecified: Secondary | ICD-10-CM

## 2017-06-04 DIAGNOSIS — E1139 Type 2 diabetes mellitus with other diabetic ophthalmic complication: Secondary | ICD-10-CM | POA: Diagnosis not present

## 2017-06-04 DIAGNOSIS — E1159 Type 2 diabetes mellitus with other circulatory complications: Secondary | ICD-10-CM

## 2017-06-04 DIAGNOSIS — E1140 Type 2 diabetes mellitus with diabetic neuropathy, unspecified: Secondary | ICD-10-CM

## 2017-06-04 DIAGNOSIS — E1122 Type 2 diabetes mellitus with diabetic chronic kidney disease: Secondary | ICD-10-CM | POA: Diagnosis not present

## 2017-06-04 DIAGNOSIS — I10 Essential (primary) hypertension: Secondary | ICD-10-CM

## 2017-06-04 LAB — BAYER DCA HB A1C WAIVED: HB A1C: 5.8 % (ref <7.0)

## 2017-06-04 MED ORDER — CLONIDINE HCL 0.1 MG PO TABS: 0.1000 mg | ORAL_TABLET | Freq: Three times a day (TID) | ORAL | 3 refills | Status: DC

## 2017-06-04 NOTE — Progress Notes (Signed)
BP (!) 149/78   Pulse 80   Temp (!) 97 F (36.1 C) (Oral)   Ht 5\' 4"  (1.626 m)   Wt 230 lb (104.3 kg)   BMI 39.48 kg/m    Subjective:    Patient ID: Anna Dickerson, female    DOB: [birth year], 84 y.o.   MRN: [id number]  HPI: Anna Dickerson is a 84 y.o. female presenting on 06/04/2017 for Diabetes and Hypertension   HPI Hypertension Patient is currently on amlodipine and carvedilol, and their blood pressure today is 149/78. Patient denies any lightheadedness or dizziness. Patient denies headaches, blurred vision, chest pains, shortness of breath, or weakness. Denies any side effects from medication and is content with current medication.  With stage III renal disease at limits or medication use and we will try clonidine at a low dose.  Type 2 diabetes mellitus Patient comes in today for recheck of his diabetes. Patient has been currently taking metformin and Trulicity. Patient is not currently on an ACE inhibitor/ARB because of renal disease. Patient has not seen an ophthalmologist this year. Patient denies any new issues with their feet but does feel like she is got burning and numbness in both of her feet and wants to know if she can get diabetic shoes to help prevent sores and infections..  Patient has known stage III CKD and does see nephrology for this.  Patient is morbidly obese and has gained some weight just recently we will discuss weight loss options including being more physically active and finding out if she has silver sneakers to go to a gym.  Relevant past medical, surgical, family and social history reviewed and updated as indicated. Interim medical history since our last visit reviewed. Allergies and medications reviewed and updated.  Review of Systems  Constitutional: Positive for unexpected weight change. Negative for chills and fever.  Eyes: Negative for visual disturbance.  Respiratory: Negative for chest tightness and shortness of breath.   Cardiovascular:  Negative for chest pain and leg swelling.  Genitourinary: Negative for difficulty urinating and dysuria.  Musculoskeletal: Negative for back pain and gait problem.  Skin: Negative for rash.  Neurological: Positive for numbness. Negative for dizziness, weakness, light-headedness and headaches.  Psychiatric/Behavioral: Negative for agitation and behavioral problems.  All other systems reviewed and are negative.   Per HPI unless specifically indicated above   Allergies as of 06/04/2017      Reactions   Sulfa Antibiotics    Mouth ulcers      Medication List        Accurate as of 06/04/17  2:14 PM. Always use your most recent med list.          amLODipine 10 MG tablet Commonly known as:  NORVASC TAKE 1 TABLET DAILY   aspirin 81 MG tablet Take 81 mg by mouth daily.   atorvastatin 20 MG tablet Commonly known as:  LIPITOR TAKE (1) TABLET DAILY AT BEDTIME.   carvedilol 12.5 MG tablet Commonly known as:  COREG Take 12.5 mg by mouth 2 (two) times daily with a meal.   cephALEXin 500 MG capsule Commonly known as:  KEFLEX Take 1 capsule (500 mg total) by mouth 4 (four) times daily.   cyanocobalamin 1000 MCG/ML injection Commonly known as:  (VITAMIN B-12) Inject 1 mL (1,000 mcg total) every 30 (thirty) days into the muscle.   Fish Oil 1000 MG Caps Take 1,000 mg by mouth 2 (two) times daily.   fluticasone 50 MCG/ACT nasal spray Commonly known  as:  FLONASE Place 1 spray into both nostrils 2 (two) times daily as needed for allergies or rhinitis.   furosemide 20 MG tablet Commonly known as:  LASIX Take 1 tablet (20 mg total) by mouth daily.   LORazepam 0.5 MG tablet Commonly known as:  ATIVAN Take 1 Tablet by mouth 2 times a day as needed   LORazepam 0.5 MG tablet Commonly known as:  ATIVAN TAKE (1) TABLET TWICE A DAY AS NEEDED.   Melatonin 5 MG Tabs Take 1.5 tablets (7.5 mg total) by mouth at bedtime.   MELATONIN/VITAMIN B-6 EX ST 5-1 MG Tabs Generic drug:   Melatonin-Pyridoxine TAKE ONE TABLET AT BEDTIME   metFORMIN 1000 MG tablet Commonly known as:  GLUCOPHAGE Take 1 Tablet by mouth once daily with breakfast   PRESERVISION AREDS 2 Caps Take 2 capsules by mouth 2 (two) times daily.   TRULICITY 8.93 YB/0.1BP Sopn Generic drug:  Dulaglutide INJECT 0.75 MG ONCE A WEEK   venlafaxine XR 150 MG 24 hr capsule Commonly known as:  EFFEXOR-XR TAKE 1 CAPSULE ONCE DAILY WITH BREAKFAST   Vitamin D (Ergocalciferol) 50000 units Caps capsule Commonly known as:  DRISDOL TAKE 1 CAPSULE ONCE A WEEK          Objective:    BP (!) 149/78   Pulse 80   Temp (!) 97 F (36.1 C) (Oral)   Ht 5\' 4"  (1.626 m)   Wt 230 lb (104.3 kg)   BMI 39.48 kg/m   Wt Readings from Last 3 Encounters:  06/04/17 230 lb (104.3 kg)  03/05/17 219 lb (99.3 kg)  02/03/17 221 lb (100.2 kg)    Physical Exam  Constitutional: She is oriented to person, place, and time. She appears well-developed and well-nourished. No distress.  Eyes: Pupils are equal, round, and reactive to light. Conjunctivae and EOM are normal.  Neck: Neck supple. No thyromegaly present.  Cardiovascular: Normal rate, regular rhythm, normal heart sounds and intact distal pulses.  No murmur heard. Pulmonary/Chest: Effort normal and breath sounds normal. No respiratory distress. She has no wheezes.  Musculoskeletal: Normal range of motion. She exhibits no edema or tenderness.  Lymphadenopathy:    She has no cervical adenopathy.  Neurological: She is alert and oriented to person, place, and time. Coordination normal.  Skin: Skin is warm and dry. No rash noted. She is not diaphoretic.  Psychiatric: She has a normal mood and affect. Her behavior is normal.  Nursing note and vitals reviewed.       Assessment & Plan:   Problem List Items Addressed This Visit      Cardiovascular and Mediastinum   Hypertension associated with diabetes ([place])   Relevant Medications   cloNIDine (CATAPRES) 0.1 MG tablet      Endocrine   DM (diabetes mellitus) ([place]) - Primary   Relevant Orders   DME Other see comment   Microalbumin/Creatinine Ratio, Urine   Bayer DCA Hb A1c Waived   DME Other see comment   CKD stage 3 due to type 2 diabetes mellitus ([place])   Relevant Orders   DME Other see comment   Type 2 diabetes mellitus with diabetic neuropathy, without long-term current use of insulin ([facility])   Relevant Orders   DME Other see comment     Other   Morbid obesity ([place])      BMI of 36 with diabetes and hypertension  Patient has stage III CKD which is being managed by nephrology  Patient's blood pressure continues to remain on  the higher end and will try clonidine although I am resistant towards it and reinforce the patient's need to take it consistently throughout the day. Follow up plan: Return in about 3 months (around 09/04/2017), or if symptoms worsen or fail to improve, for Diabetes hypertension recheck.  Counseling provided for all of the vaccine components Orders Placed This Encounter  Procedures  . Microalbumin/Creatinine Ratio, Urine  . Bayer [facility] Hb A1c [place], MD [facility] 06/04/2017, 2:14 PM

## 2017-06-05 LAB — MICROALBUMIN / CREATININE URINE RATIO
CREATININE, UR: 72.5 mg/dL
Microalb/Creat Ratio: 1973.2 mg/g creat — ABNORMAL HIGH (ref 0.0–30.0)
Microalbumin, Urine: 1430.6 ug/mL

## 2017-06-15 ENCOUNTER — Other Ambulatory Visit: Payer: Self-pay | Admitting: Family Medicine

## 2017-06-15 DIAGNOSIS — F329 Major depressive disorder, single episode, unspecified: Secondary | ICD-10-CM

## 2017-06-15 DIAGNOSIS — F419 Anxiety disorder, unspecified: Principal | ICD-10-CM

## 2017-06-15 LAB — HM DIABETES EYE EXAM

## 2017-06-23 ENCOUNTER — Other Ambulatory Visit: Payer: Self-pay | Admitting: Family Medicine

## 2017-06-23 DIAGNOSIS — I10 Essential (primary) hypertension: Secondary | ICD-10-CM

## 2017-06-25 ENCOUNTER — Other Ambulatory Visit: Payer: Self-pay | Admitting: Family Medicine

## 2017-07-08 ENCOUNTER — Other Ambulatory Visit: Payer: Medicare Other

## 2017-07-14 ENCOUNTER — Other Ambulatory Visit: Payer: Self-pay | Admitting: Family Medicine

## 2017-07-14 NOTE — Telephone Encounter (Signed)
Last Vit D 05/13/16  38.3

## 2017-07-24 ENCOUNTER — Other Ambulatory Visit: Payer: Self-pay | Admitting: Family Medicine

## 2017-07-24 ENCOUNTER — Telehealth: Payer: Self-pay | Admitting: *Deleted

## 2017-07-24 NOTE — Telephone Encounter (Signed)
Last seen 06/04/17 

## 2017-07-24 NOTE — Telephone Encounter (Signed)
VM from NP with [place] saw patient this morning Performed quanti flow  0.70 left lower extremity 0.84 right lower extremity Patient has no symptoms and is on aspirin She suggests follow up on her next OV with you

## 2017-07-29 ENCOUNTER — Other Ambulatory Visit (HOSPITAL_COMMUNITY): Payer: Medicare Other

## 2017-08-05 ENCOUNTER — Other Ambulatory Visit (HOSPITAL_COMMUNITY): Payer: Medicare Other

## 2017-08-05 ENCOUNTER — Ambulatory Visit (HOSPITAL_COMMUNITY): Payer: Medicare Other | Admitting: Internal Medicine

## 2017-08-12 ENCOUNTER — Other Ambulatory Visit: Payer: Self-pay | Admitting: Family Medicine

## 2017-08-12 DIAGNOSIS — I10 Essential (primary) hypertension: Secondary | ICD-10-CM

## 2017-08-13 ENCOUNTER — Telehealth: Payer: Self-pay | Admitting: *Deleted

## 2017-08-13 MED ORDER — ACCU-CHEK AVIVA PLUS W/DEVICE KIT: PACK | 0 refills | Status: DC

## 2017-08-13 MED ORDER — GLUCOSE BLOOD VI STRP: ORAL_STRIP | 0 refills | Status: DC

## 2017-08-13 MED ORDER — ACCU-CHEK SOFTCLIX LANCET DEV KIT: PACK | 0 refills | Status: DC

## 2017-08-13 MED ORDER — ACCU-CHEK SOFTCLIX LANCETS MISC: 0 refills | Status: DC

## 2017-08-13 NOTE — Telephone Encounter (Signed)
TC to [facility] rep for list of covered glucometers Rep will let patient know meter will be sent to [place] today

## 2017-08-24 ENCOUNTER — Other Ambulatory Visit: Payer: Self-pay | Admitting: Family Medicine

## 2017-08-24 DIAGNOSIS — E785 Hyperlipidemia, unspecified: Secondary | ICD-10-CM

## 2017-08-24 NOTE — Telephone Encounter (Signed)
Last seen 06/04/17  Dr Dettinger  Last lipid 03/05/17  Last Vit D 05/13/16  38.3

## 2017-09-04 ENCOUNTER — Ambulatory Visit: Payer: Medicare Other | Admitting: Family Medicine

## 2017-09-04 ENCOUNTER — Encounter: Payer: Self-pay | Admitting: Family Medicine

## 2017-09-04 VITALS — BP 172/77 | HR 75 | Temp 96.9°F | Ht 64.0 in | Wt 229.0 lb

## 2017-09-04 DIAGNOSIS — E1122 Type 2 diabetes mellitus with diabetic chronic kidney disease: Secondary | ICD-10-CM

## 2017-09-04 DIAGNOSIS — I10 Essential (primary) hypertension: Secondary | ICD-10-CM

## 2017-09-04 DIAGNOSIS — E1139 Type 2 diabetes mellitus with other diabetic ophthalmic complication: Secondary | ICD-10-CM

## 2017-09-04 DIAGNOSIS — N183 Chronic kidney disease, stage 3 unspecified: Secondary | ICD-10-CM

## 2017-09-04 DIAGNOSIS — E1140 Type 2 diabetes mellitus with diabetic neuropathy, unspecified: Secondary | ICD-10-CM

## 2017-09-04 DIAGNOSIS — E1159 Type 2 diabetes mellitus with other circulatory complications: Secondary | ICD-10-CM

## 2017-09-04 DIAGNOSIS — E538 Deficiency of other specified B group vitamins: Secondary | ICD-10-CM | POA: Diagnosis not present

## 2017-09-04 DIAGNOSIS — E1169 Type 2 diabetes mellitus with other specified complication: Secondary | ICD-10-CM

## 2017-09-04 DIAGNOSIS — E785 Hyperlipidemia, unspecified: Secondary | ICD-10-CM

## 2017-09-04 DIAGNOSIS — L84 Corns and callosities: Secondary | ICD-10-CM

## 2017-09-04 LAB — BAYER DCA HB A1C WAIVED: HB A1C (BAYER DCA - WAIVED): 6.1 % (ref <7.0)

## 2017-09-04 NOTE — Progress Notes (Signed)
BP (!) 172/77   Pulse 75   Temp (!) 96.9 F (36.1 C) (Oral)   Ht 5' 4"  (1.626 m)   Wt 229 lb (103.9 kg)   BMI 39.31 kg/m    Subjective:    Patient ID: Anna Dickerson, female    DOB: 1929/08/19, 84 y.o.   MRN: [id number]  HPI: Anna Dickerson is a 84 y.o. female presenting on 09/04/2017 for Diabetes (3 mo); Hyperlipidemia; and Hypertension   HPI Type 2 diabetes mellitus Patient comes in today for recheck of his diabetes. Patient has been currently taking metformin and Trulicity. Patient is not currently on an ACE inhibitor/ARB because of renal function. Patient has not seen an ophthalmologist this year. Patient does have neuropathy in her feet and also has calluses and some skin breakdown in the center of her right foot that she does not know about.  Patient also has known stage III CKD  Hypertension Patient is currently on amlodipine and clonidine and carvedilol, her nephrologist has been managing this and just increase her medications but it does not seem to make a huge difference., and their blood pressure today is 173/72. Patient denies any lightheadedness or dizziness. Patient denies headaches, blurred vision, chest pains, shortness of breath, or weakness. Denies any side effects from medication and is content with current medication.   Hyperlipidemia Patient is coming in for recheck of his hyperlipidemia. The patient is currently taking Lipitor. They deny any issues with myalgias or history of liver damage from it. They deny any focal numbness or weakness or chest pain.   Relevant past medical, surgical, family and social history reviewed and updated as indicated. Interim medical history since our last visit reviewed. Allergies and medications reviewed and updated.  Review of Systems  Constitutional: Negative for chills and fever.  Eyes: Negative for visual disturbance.  Respiratory: Negative for chest tightness and shortness of breath.   Cardiovascular: Negative for  chest pain and leg swelling.  Musculoskeletal: Negative for back pain and gait problem.  Skin: Negative for rash.  Neurological: Negative for dizziness, weakness, light-headedness, numbness and headaches.  Psychiatric/Behavioral: Negative for agitation and behavioral problems.  All other systems reviewed and are negative.   Per HPI unless specifically indicated above   Allergies as of 09/04/2017      Reactions   Sulfa Antibiotics    Mouth ulcers      Medication List        Accurate as of 09/04/17  2:53 PM. Always use your most recent med list.          ACCU-CHEK AVIVA PLUS w/Device Kit Check BS up to three times daily   ACCU-CHEK SOFTCLIX LANCETS lancets Check BS up to three times daily   amLODipine 10 MG tablet Commonly known as:  NORVASC TAKE 1 TABLET DAILY   aspirin 81 MG tablet Take 81 mg by mouth daily.   atorvastatin 20 MG tablet Commonly known as:  LIPITOR TAKE (1) TABLET DAILY AT BEDTIME.   carvedilol 12.5 MG tablet Commonly known as:  COREG Take 12.5 mg by mouth 2 (two) times daily with a meal.   cloNIDine 0.1 MG tablet Commonly known as:  CATAPRES Take 1 tablet (0.1 mg total) by mouth 3 (three) times daily.   cyanocobalamin 1000 MCG/ML injection Commonly known as:  (VITAMIN B-12) Inject 1 mL (1,000 mcg total) every 30 (thirty) days into the muscle.   Fish Oil 1000 MG Caps Take 1,000 mg by mouth 2 (two) times daily.  fluticasone 50 MCG/ACT nasal spray Commonly known as:  FLONASE Place 1 spray into both nostrils 2 (two) times daily as needed for allergies or rhinitis.   furosemide 20 MG tablet Commonly known as:  LASIX TAKE 1 TABLET DAILY   glucose blood test strip Check BS up to three times daily   LORazepam 0.5 MG tablet Commonly known as:  ATIVAN Take 1 Tablet by mouth 2 times a day as needed   LORazepam 0.5 MG tablet Commonly known as:  ATIVAN TAKE (1) TABLET TWICE A DAY AS NEEDED.   Melatonin 5 MG Tabs Take 1.5 tablets (7.5 mg  total) by mouth at bedtime.   MELATONIN/VITAMIN B-6 EX ST 5-1 MG Tabs Generic drug:  Melatonin-Pyridoxine TAKE ONE TABLET AT BEDTIME   metFORMIN 1000 MG tablet Commonly known as:  GLUCOPHAGE Take 1 Tablet by mouth once daily with breakfast   PRESERVISION AREDS 2 Caps Take 2 capsules by mouth 2 (two) times daily.   TRULICITY 9.67 EL/3.8BO Sopn Generic drug:  Dulaglutide INJECT 0.75 MG ONCE A WEEK   venlafaxine XR 150 MG 24 hr capsule Commonly known as:  EFFEXOR-XR TAKE 1 CAPSULE ONCE DAILY WITH BREAKFAST   Vitamin D (Ergocalciferol) 50000 units Caps capsule Commonly known as:  DRISDOL TAKE 1 CAPSULE ONCE A WEEK          Objective:    BP (!) 173/72 (BP Location: Left Arm)   Pulse 75   Temp (!) 96.9 F (36.1 C) (Oral)   Ht 5' 4"  (1.626 m)   Wt 229 lb (103.9 kg)   BMI 39.31 kg/m   Wt Readings from Last 3 Encounters:  09/04/17 229 lb (103.9 kg)  06/04/17 230 lb (104.3 kg)  03/05/17 219 lb (99.3 kg)    Physical Exam  Constitutional: She is oriented to person, place, and time. She appears well-developed and well-nourished. No distress.  Eyes: Conjunctivae are normal.  Neck: Neck supple. No thyromegaly present.  Cardiovascular: Normal rate, regular rhythm, normal heart sounds and intact distal pulses.  No murmur heard. Pulmonary/Chest: Effort normal and breath sounds normal. No respiratory distress. She has no wheezes.  Musculoskeletal: Normal range of motion. She exhibits no edema.  Lymphadenopathy:    She has no cervical adenopathy.  Neurological: She is alert and oriented to person, place, and time. Coordination normal.  Skin: Skin is warm and dry. No rash noted. She is not diaphoretic.  Psychiatric: She has a normal mood and affect. Her behavior is normal.  Nursing note and vitals reviewed.   Diabetic Foot Exam - Simple   Simple Foot Form Diabetic Foot exam was performed with the following findings:  Yes 09/04/2017  2:46 PM  Visual Inspection Sensation  Testing Intact to touch and monofilament testing bilaterally:  Yes Pulse Check Posterior Tibialis and Dorsalis pulse intact bilaterally:  Yes Comments Small area of skin breakdown, very superficial in the center of the bottom of her left foot, patient also has calluses that are small on the lateral aspect of both of her feet just proximal to the fifth toe.        Assessment & Plan:   Problem List Items Addressed This Visit      Cardiovascular and Mediastinum   Hypertension associated with diabetes ([place])   Relevant Orders   CMP14+EGFR     Endocrine   DM (diabetes mellitus) ([place]) - Primary   Relevant Orders   Bayer DCA Hb A1c Waived   CKD stage 3 due to type 2 diabetes mellitus (  [place])   Relevant Orders   CBC with Differential/Platelet   Type 2 diabetes mellitus with diabetic neuropathy, without long-term current use of insulin ([place])    Other Visit Diagnoses    Vitamin B12 deficiency       Dyslipidemia associated with type 2 diabetes mellitus ([place])       Relevant Orders   Lipid panel   Callus of foot        Patient has some early peripheral neuropathy with calluses on both of her feet and wants to get diabetic shoes.  We will do the order for that.  Follow up plan: Return in about 3 months (around 12/05/2017), or if symptoms worsen or fail to improve, for Recheck diabetes and blood pressure.  Counseling provided for all of the vaccine components Orders Placed This Encounter  Procedures  . Bayer DCA Hb A1c Waived  . CBC with Differential/Platelet  . CMP14+EGFR  . Lipid panel    Caryl Pina, MD [facility] 09/04/2017, 2:53 PM

## 2017-09-05 LAB — LIPID PANEL
CHOL/HDL RATIO: 3 ratio (ref 0.0–4.4)
CHOLESTEROL TOTAL: 121 mg/dL (ref 100–199)
HDL: 40 mg/dL (ref >39)
LDL Calculated: 42 mg/dL (ref 0–99)
TRIGLYCERIDES: 194 mg/dL — AB (ref 0–149)
VLDL Cholesterol Cal: 39 mg/dL (ref 5–40)

## 2017-09-05 LAB — CMP14+EGFR
A/G RATIO: 1.3 (ref 1.2–2.2)
ALK PHOS: 100 IU/L (ref 39–117)
ALT: 14 IU/L (ref 0–32)
AST: 16 IU/L (ref 0–40)
Albumin: 4 g/dL (ref 3.5–4.7)
BUN/Creatinine Ratio: 19 (ref 12–28)
BUN: 35 mg/dL — ABNORMAL HIGH (ref 8–27)
Bilirubin Total: 0.2 mg/dL (ref 0.0–1.2)
CO2: 21 mmol/L (ref 20–29)
Calcium: 9.2 mg/dL (ref 8.7–10.3)
Chloride: 106 mmol/L (ref 96–106)
Creatinine, Ser: 1.8 mg/dL — ABNORMAL HIGH (ref 0.57–1.00)
GFR calc Af Amer: 29 mL/min/{1.73_m2} — ABNORMAL LOW (ref >59)
GFR calc non Af Amer: 25 mL/min/{1.73_m2} — ABNORMAL LOW (ref >59)
GLOBULIN, TOTAL: 3.1 g/dL (ref 1.5–4.5)
Glucose: 103 mg/dL — ABNORMAL HIGH (ref 65–99)
Potassium: 4 mmol/L (ref 3.5–5.2)
SODIUM: 145 mmol/L — AB (ref 134–144)
Total Protein: 7.1 g/dL (ref 6.0–8.5)

## 2017-09-05 LAB — CBC WITH DIFFERENTIAL/PLATELET
BASOS: 0 %
Basophils Absolute: 0 10*3/uL (ref 0.0–0.2)
EOS (ABSOLUTE): 0.4 10*3/uL (ref 0.0–0.4)
EOS: 4 %
HEMATOCRIT: 34.1 % (ref 34.0–46.6)
Hemoglobin: 11.4 g/dL (ref 11.1–15.9)
Immature Grans (Abs): 0 10*3/uL (ref 0.0–0.1)
Immature Granulocytes: 0 %
LYMPHS ABS: 3.2 10*3/uL — AB (ref 0.7–3.1)
Lymphs: 34 %
MCH: 29.7 pg (ref 26.6–33.0)
MCHC: 33.4 g/dL (ref 31.5–35.7)
MCV: 89 fL (ref 79–97)
MONOS ABS: 0.6 10*3/uL (ref 0.1–0.9)
Monocytes: 6 %
Neutrophils Absolute: 5.3 10*3/uL (ref 1.4–7.0)
Neutrophils: 56 %
PLATELETS: 213 10*3/uL (ref 150–450)
RBC: 3.84 x10E6/uL (ref 3.77–5.28)
RDW: 14.6 % (ref 12.3–15.4)
WBC: 9.5 10*3/uL (ref 3.4–10.8)

## 2017-09-09 ENCOUNTER — Other Ambulatory Visit: Payer: Self-pay | Admitting: Family Medicine

## 2017-09-09 DIAGNOSIS — F329 Major depressive disorder, single episode, unspecified: Secondary | ICD-10-CM

## 2017-09-09 DIAGNOSIS — F419 Anxiety disorder, unspecified: Principal | ICD-10-CM

## 2017-09-16 ENCOUNTER — Other Ambulatory Visit: Payer: Medicare Other

## 2017-09-21 ENCOUNTER — Other Ambulatory Visit: Payer: Self-pay | Admitting: Family Medicine

## 2017-09-21 DIAGNOSIS — I10 Essential (primary) hypertension: Secondary | ICD-10-CM

## 2017-09-28 ENCOUNTER — Other Ambulatory Visit: Payer: Self-pay | Admitting: Family Medicine

## 2017-09-28 DIAGNOSIS — E785 Hyperlipidemia, unspecified: Secondary | ICD-10-CM

## 2017-09-29 NOTE — Telephone Encounter (Signed)
Last Vit D 05/13/16  38.3

## 2017-10-12 ENCOUNTER — Ambulatory Visit (HOSPITAL_COMMUNITY)
Admission: RE | Admit: 2017-10-12 | Discharge: 2017-10-12 | Disposition: A | Discharge status: Home / Self Care | Payer: Medicare Other | Source: Ambulatory Visit | Attending: Oncology | Admitting: Oncology

## 2017-10-12 ENCOUNTER — Inpatient Hospital Stay (HOSPITAL_COMMUNITY): Payer: Medicare Other | Attending: Hematology

## 2017-10-12 DIAGNOSIS — Z170 Estrogen receptor positive status [ER+]: Secondary | ICD-10-CM

## 2017-10-12 DIAGNOSIS — C50912 Malignant neoplasm of unspecified site of left female breast: Secondary | ICD-10-CM

## 2017-10-12 DIAGNOSIS — Z853 Personal history of malignant neoplasm of breast: Secondary | ICD-10-CM | POA: Insufficient documentation

## 2017-10-12 DIAGNOSIS — Z1231 Encounter for screening mammogram for malignant neoplasm of breast: Secondary | ICD-10-CM | POA: Insufficient documentation

## 2017-10-12 LAB — COMPREHENSIVE METABOLIC PANEL
ALBUMIN: 3.7 g/dL (ref 3.5–5.0)
ALT: 17 U/L (ref 0–44)
ANION GAP: 9 (ref 5–15)
AST: 20 U/L (ref 15–41)
Alkaline Phosphatase: 78 U/L (ref 38–126)
BUN: 33 mg/dL — ABNORMAL HIGH (ref 8–23)
CALCIUM: 9.1 mg/dL (ref 8.9–10.3)
CHLORIDE: 106 mmol/L (ref 98–111)
CO2: 25 mmol/L (ref 22–32)
Creatinine, Ser: 1.66 mg/dL — ABNORMAL HIGH (ref 0.44–1.00)
GFR calc Af Amer: 31 mL/min — ABNORMAL LOW (ref >60)
GFR calc non Af Amer: 27 mL/min — ABNORMAL LOW (ref >60)
GLUCOSE: 147 mg/dL — AB (ref 70–99)
POTASSIUM: 4.1 mmol/L (ref 3.5–5.1)
SODIUM: 140 mmol/L (ref 135–145)
Total Bilirubin: 0.6 mg/dL (ref 0.3–1.2)
Total Protein: 7.7 g/dL (ref 6.5–8.1)

## 2017-10-12 LAB — CBC WITH DIFFERENTIAL/PLATELET
BASOS PCT: 1 %
Basophils Absolute: 0.1 10*3/uL (ref 0.0–0.1)
EOS ABS: 0.4 10*3/uL (ref 0.0–0.7)
EOS PCT: 4 %
HCT: 37 % (ref 36.0–46.0)
Hemoglobin: 12 g/dL (ref 12.0–15.0)
Lymphocytes Relative: 28 %
Lymphs Abs: 2.6 10*3/uL (ref 0.7–4.0)
MCH: 29.7 pg (ref 26.0–34.0)
MCHC: 32.4 g/dL (ref 30.0–36.0)
MCV: 91.6 fL (ref 78.0–100.0)
MONOS PCT: 5 %
Monocytes Absolute: 0.5 10*3/uL (ref 0.1–1.0)
NEUTROS PCT: 62 %
Neutro Abs: 5.7 10*3/uL (ref 1.7–7.7)
PLATELETS: 201 10*3/uL (ref 150–400)
RBC: 4.04 MIL/uL (ref 3.87–5.11)
RDW: 13.8 % (ref 11.5–15.5)
WBC: 9.2 10*3/uL (ref 4.0–10.5)

## 2017-10-19 ENCOUNTER — Other Ambulatory Visit: Payer: Self-pay

## 2017-10-19 ENCOUNTER — Encounter (HOSPITAL_COMMUNITY): Payer: Self-pay | Admitting: Internal Medicine

## 2017-10-19 ENCOUNTER — Other Ambulatory Visit (HOSPITAL_COMMUNITY): Payer: Self-pay | Admitting: Internal Medicine

## 2017-10-19 ENCOUNTER — Inpatient Hospital Stay (HOSPITAL_COMMUNITY): Payer: Medicare Other | Attending: Hematology | Admitting: Internal Medicine

## 2017-10-19 VITALS — BP 223/81 | HR 87 | Temp 97.9°F | Resp 18 | Wt 230.1 lb

## 2017-10-19 DIAGNOSIS — Z853 Personal history of malignant neoplasm of breast: Secondary | ICD-10-CM | POA: Diagnosis not present

## 2017-10-19 DIAGNOSIS — Z923 Personal history of irradiation: Secondary | ICD-10-CM | POA: Insufficient documentation

## 2017-10-19 DIAGNOSIS — Z79899 Other long term (current) drug therapy: Secondary | ICD-10-CM | POA: Insufficient documentation

## 2017-10-19 DIAGNOSIS — I10 Essential (primary) hypertension: Secondary | ICD-10-CM | POA: Diagnosis not present

## 2017-10-19 DIAGNOSIS — Z7982 Long term (current) use of aspirin: Secondary | ICD-10-CM | POA: Diagnosis not present

## 2017-10-19 DIAGNOSIS — Z1231 Encounter for screening mammogram for malignant neoplasm of breast: Secondary | ICD-10-CM

## 2017-10-19 DIAGNOSIS — Z170 Estrogen receptor positive status [ER+]: Secondary | ICD-10-CM | POA: Diagnosis not present

## 2017-10-19 DIAGNOSIS — C50912 Malignant neoplasm of unspecified site of left female breast: Secondary | ICD-10-CM

## 2017-10-19 NOTE — Progress Notes (Signed)
Diagnosis Malignant neoplasm of left breast in female, estrogen receptor positive, unspecified site of breast ([place]) - Plan: CBC with Differential/Platelet, Comprehensive metabolic panel, Lactate dehydrogenase, CANCELED: MM 3D SCREEN BREAST BILATERAL  Staging Cancer Staging Stage I left-sided Breast cancer Staging form: Breast, AJCC 7th Edition - Clinical: Stage IA (T1a, N0, cM0) - Signed by Baird Cancer, PA on 12/31/2010   Assessment and Plan:  1.  Stage I left-sided Breast cancer.  Stage I (T1A N0 M0) left sided breast carcinoma, invasive disease, associated with some DCIS, status post lumpectomy, followed her radiation therapy was surgery on August 2007. She had an ER positive tumor and 93% PR +90% HER-2/neu negative, Ki-67 marker low 8%, high grade DCIS however was seen and a little microinvasion was also found. The maximum size of the invasive compound was 4 mm. She is status post 5 years of tamoxifen 20 mg daily in 2012 (starting on 12/23/2005).   3 D screening mammogram done 10/12/2017 reviewed and showed Surgical changes in left breast with no evidence of malignancy in either breast.    Labs done 10/12/2017 reviewed and showed WBC 9.2 HB 12 plts 201,000.  Chemistries WNL with K+ 4, Cr 1.66 and normal LFTs.   She is now more than 7 years out from completion of therapy.  She will be seen yearly for follow-up with mammogram and labs.    2.  HTN.  BP noted to be elevated 223/81.  She reports recent PCP visit had SBP of 140.  Pt referred to PCP or nephrology as at last visit she also was noted to have elevated BP.  Pt reports she did not take BP meds today.    3.  DM.  Follow-up with PCP.   Greater than 25 minutes spent with more than 50% spent in counseling and coordination of care.     Interval History:  Historical data obtained from note dated 02/03/2017.  Stage I (T1A N0 M0) left sided breast carcinoma, invasive disease, associated with some DCIS, status post lumpectomy, followed  her radiation therapy was surgery on August 2007. She had an ER positive tumor and 93% PR +90% HER-2/neu negative, Ki-67 marker low 8%, high grade DCIS however was seen and a little microinvasion was also found. The maximum size of the invasive compound was 4 mm. She is status post 5 years of tamoxifen 20 mg daily in 2012 (starting on 12/23/2005).   Current Status:  Pt is seen today for follow-up.  She is here to go over mammogram and labs.    Problem List Patient Active Problem List   Diagnosis Date Noted  . Type 2 diabetes mellitus with diabetic neuropathy, without long-term current use of insulin ([place]) [E11.40] 06/04/2017  . CKD stage 3 due to type 2 diabetes mellitus ([place]) [E36.62, N18.3] 07/31/2016  . Depression, recurrent ([place]) [F33.9] 03/20/2015  . OAB (overactive bladder) [N32.81] 03/20/2015  . B12 deficiency [E53.8] 02/08/2015  . Iron deficiency anemia due to chronic blood loss [D50.0] 02/02/2013  . Iron deficiency [E61.1] 02/24/2012  . Stage I left-sided Breast cancer [C50.919] 12/31/2010  . Morbid obesity ([place]) [E66.01] 12/31/2010  . Hypertension associated with diabetes ([place]) [E11.59, I10] 12/31/2010  . Macular degeneration [H35.30] 12/31/2010  . DM (diabetes mellitus) ([place]) [E11.9] 12/31/2010    Past Medical History Past Medical History:  Diagnosis Date  . B12 deficiency 02/08/2015  . Cancer [facility]) [id number]   breast  . Chronic kidney disease   . Diabetes mellitus   . DM (diabetes mellitus) (  [place]) 12/31/2010  . HTN (hypertension) 12/31/2010  . Hypertension   . Iron deficiency 02/24/2012  . Macular degeneration 12/31/2010  . Macular degeneration, left eye   . OAB (overactive bladder)   . Obesity 12/31/2010  . Stage I left-sided Breast cancer 12/31/2010    Past Surgical History Past Surgical History:  Procedure Laterality Date  . bilateral cataract with intraocular lens implant    . Bladder tacked-up    . BREAST SURGERY  left    . REPLACEMENT TOTAL KNEE BILATERAL  Bilateral    TKR were ar different times about 2 yeras apart.  Marland Kitchen SKIN CANCER EXCISION     squamous cell right ear and calf right leg    Family History Family History  Problem Relation Age of Onset  . Cancer Mother      Social History  reports that she has never smoked. She has never used smokeless tobacco. She reports that she does not drink alcohol or use drugs.  Medications  Current Outpatient Medications:  .  ACCU-CHEK SOFTCLIX LANCETS lancets, Check BS up to three times daily, Disp: 300 each, Rfl: 0 .  amLODipine (NORVASC) 10 MG tablet, TAKE 1 TABLET DAILY, Disp: 90 tablet, Rfl: 0 .  aspirin 81 MG tablet, Take 81 mg by mouth daily.  , Disp: , Rfl:  .  atorvastatin (LIPITOR) 20 MG tablet, TAKE (1) TABLET DAILY AT BEDTIME., Disp: 30 tablet, Rfl: 4 .  Blood Glucose Monitoring Suppl (ACCU-CHEK AVIVA PLUS) w/Device KIT, Check BS up to three times daily, Disp: 1 kit, Rfl: 0 .  carvedilol (COREG) 25 MG tablet, , Disp: , Rfl:  .  cloNIDine (CATAPRES) 0.1 MG tablet, Take 1 tablet (0.1 mg total) by mouth 3 (three) times daily., Disp: 90 tablet, Rfl: 3 .  cyanocobalamin (,VITAMIN B-12,) 1000 MCG/ML injection, Inject 1 mL (1,000 mcg total) every 30 (thirty) days into the muscle., Disp: 1 mL, Rfl: 11 .  fluticasone (FLONASE) 50 MCG/ACT nasal spray, Place 1 spray into both nostrils 2 (two) times daily as needed for allergies or rhinitis., Disp: 16 g, Rfl: 6 .  furosemide (LASIX) 20 MG tablet, TAKE 1 TABLET DAILY, Disp: 90 tablet, Rfl: 0 .  glucose blood (ACCU-CHEK AVIVA PLUS) test strip, Check BS up to three times daily, Disp: 300 each, Rfl: 0 .  LORazepam (ATIVAN) 0.5 MG tablet, TAKE (1) TABLET TWICE A DAY AS NEEDED., Disp: 60 tablet, Rfl: 2 .  Melatonin 5 MG TABS, Take 1.5 tablets (7.5 mg total) by mouth at bedtime., Disp: 30 tablet, Rfl: 6 .  MELATONIN/VITAMIN B-6 EX ST 5-1 MG TABS, TAKE ONE TABLET AT BEDTIME, Disp: 30 tablet, Rfl: 0 .  metFORMIN (GLUCOPHAGE) 1000 MG tablet, Take 1 Tablet by  mouth once daily with breakfast, Disp: 90 tablet, Rfl: 0 .  Multiple Vitamins-Minerals (PRESERVISION AREDS 2) CAPS, Take 2 capsules by mouth 2 (two) times daily., Disp: , Rfl:  .  Omega-3 Fatty Acids (FISH OIL) 1000 MG CAPS, Take 1,000 mg by mouth 2 (two) times daily., Disp: , Rfl:  .  TRULICITY 5.17 GY/1.7CB SOPN, INJECT 0.75 MG ONCE A WEEK, Disp: 2 mL, Rfl: 0 .  venlafaxine XR (EFFEXOR-XR) 150 MG 24 hr capsule, TAKE 1 CAPSULE ONCE DAILY WITH BREAKFAST, Disp: 90 capsule, Rfl: 0 .  Vitamin D, Ergocalciferol, (DRISDOL) 50000 units CAPS capsule, TAKE 1 CAPSULE ONCE A WEEK, Disp: 4 capsule, Rfl: 0  Allergies Sulfa antibiotics  Review of Systems Review of Systems - Oncology ROS negative   Physical  Exam  Vitals Wt Readings from Last 3 Encounters:  10/19/17 230 lb 1.6 oz (104.4 kg)  09/04/17 229 lb (103.9 kg)  06/04/17 230 lb (104.3 kg)   Temp Readings from Last 3 Encounters:  10/19/17 97.9 F (36.6 C) (Oral)  09/04/17 (!) 96.9 F (36.1 C) (Oral)  06/04/17 (!) 97 F (36.1 C) (Oral)   BP Readings from Last 3 Encounters:  10/19/17 (!) 223/81  09/04/17 (!) 172/77  06/04/17 (!) 149/78   Pulse Readings from Last 3 Encounters:  10/19/17 87  09/04/17 75  06/04/17 80   Constitutional: Well-developed, well-nourished, and in no distress.   HENT: Head: Normocephalic and atraumatic.  Mouth/Throat: No oropharyngeal exudate. Mucosa moist. Eyes: Pupils are equal, round, and reactive to light. Conjunctivae are normal. No scleral icterus.  Neck: Normal range of motion. Neck supple. No JVD present.  Cardiovascular: Normal rate, regular rhythm and normal heart sounds.  Exam reveals no gallop and no friction rub.   No murmur heard. Pulmonary/Chest: Effort normal and breath sounds normal. No respiratory distress. No wheezes.No rales.  Abdominal: Soft. Bowel sounds are normal. No distension. There is no tenderness. There is no guarding.  Musculoskeletal: No edema or tenderness.   Lymphadenopathy: No cervical, axilalry or supraclavicular adenopathy.  Neurological: Alert and oriented to person, place, and time. No cranial nerve deficit.  Skin: Skin is warm and dry. No rash noted. No erythema. No pallor.  Psychiatric: Affect and judgment normal.  Breast exam:  Chaperone present.  Left lumpectomy changes noted.  No dominant palpable masses noted bilaterally.    Labs No visits with results within 3 Day(s) from this visit.  Latest known visit with results is:  Appointment on 10/12/2017  Component Date Value Ref Range Status  . WBC 10/12/2017 9.2  4.0 - 10.5 K/uL Final  . RBC 10/12/2017 4.04  3.87 - 5.11 MIL/uL Final  . Hemoglobin 10/12/2017 12.0  12.0 - 15.0 g/dL Final  . HCT 10/12/2017 37.0  36.0 - 46.0 % Final  . MCV 10/12/2017 91.6  78.0 - 100.0 fL Final  . MCH 10/12/2017 29.7  26.0 - 34.0 pg Final  . MCHC 10/12/2017 32.4  30.0 - 36.0 g/dL Final  . RDW 10/12/2017 13.8  11.5 - 15.5 % Final  . Platelets 10/12/2017 201  150 - 400 K/uL Final  . Neutrophils Relative % 10/12/2017 62  % Final  . Neutro Abs 10/12/2017 5.7  1.7 - 7.7 K/uL Final  . Lymphocytes Relative 10/12/2017 28  % Final  . Lymphs Abs 10/12/2017 2.6  0.7 - 4.0 K/uL Final  . Monocytes Relative 10/12/2017 5  % Final  . Monocytes Absolute 10/12/2017 0.5  0.1 - 1.0 K/uL Final  . Eosinophils Relative 10/12/2017 4  % Final  . Eosinophils Absolute 10/12/2017 0.4  0.0 - 0.7 K/uL Final  . Basophils Relative 10/12/2017 1  % Final  . Basophils Absolute 10/12/2017 0.1  0.0 - 0.1 K/uL Final   Performed at [facility], [address], [place], [place] [address]  . Sodium 10/12/2017 140  135 - 145 mmol/L Final  . Potassium 10/12/2017 4.1  3.5 - 5.1 mmol/L Final  . Chloride 10/12/2017 106  98 - 111 mmol/L Final  . CO2 10/12/2017 25  22 - 32 mmol/L Final  . Glucose, Bld 10/12/2017 147* 70 - 99 mg/dL Final  . BUN 10/12/2017 33* 8 - 23 mg/dL Final  . Creatinine, Ser 10/12/2017 1.66* 0.44 - 1.00 mg/dL Final  .  Calcium 10/12/2017 9.1  8.9 - 10.3 mg/dL Final  . Total Protein 10/12/2017 7.7  6.5 - 8.1 g/dL Final  . Albumin 10/12/2017 3.7  3.5 - 5.0 g/dL Final  . AST 10/12/2017 20  15 - 41 U/L Final  . ALT 10/12/2017 17  0 - 44 U/L Final  . Alkaline Phosphatase 10/12/2017 78  38 - 126 U/L Final  . Total Bilirubin 10/12/2017 0.6  0.3 - 1.2 mg/dL Final  . GFR calc non Af Amer 10/12/2017 27* >60 mL/min Final  . GFR calc Af Amer 10/12/2017 31* >60 mL/min Final   Comment: (NOTE) The eGFR has been calculated using the CKD EPI equation. This calculation has not been validated in all clinical situations. eGFR's persistently <60 mL/min signify possible Chronic Kidney Disease.   Georgiann Hahn gap 10/12/2017 9  5 - 15 Final   Performed at [facility], [address] [place], [place] [address]     Pathology Orders Placed This Encounter  Procedures  . CBC with Differential/Platelet    Standing Status:   Future    Standing Expiration Date:   10/20/2019  . Comprehensive metabolic panel    Standing Status:   Future    Standing Expiration Date:   10/20/2019  . Lactate dehydrogenase    Standing Status:   Future    Standing Expiration Date:   10/20/2019       Zoila Shutter MD

## 2017-10-20 ENCOUNTER — Other Ambulatory Visit: Payer: Self-pay | Admitting: Family Medicine

## 2017-10-20 NOTE — Telephone Encounter (Signed)
Last seen 09/04/17

## 2017-10-23 ENCOUNTER — Other Ambulatory Visit: Payer: Self-pay | Admitting: Family Medicine

## 2017-10-23 DIAGNOSIS — I10 Essential (primary) hypertension: Secondary | ICD-10-CM

## 2017-10-28 ENCOUNTER — Other Ambulatory Visit: Payer: Self-pay | Admitting: Family Medicine

## 2017-10-28 DIAGNOSIS — I10 Essential (primary) hypertension: Secondary | ICD-10-CM

## 2017-11-20 ENCOUNTER — Other Ambulatory Visit: Payer: Self-pay | Admitting: Family Medicine

## 2017-11-28 ENCOUNTER — Other Ambulatory Visit: Payer: Self-pay | Admitting: Family Medicine

## 2017-12-07 ENCOUNTER — Encounter: Payer: Self-pay | Admitting: Family Medicine

## 2017-12-07 ENCOUNTER — Ambulatory Visit: Payer: Medicare Other | Admitting: Family Medicine

## 2017-12-07 ENCOUNTER — Other Ambulatory Visit: Payer: Self-pay | Admitting: Family Medicine

## 2017-12-07 VITALS — BP 177/81 | HR 79 | Temp 97.2°F | Ht 64.0 in | Wt 230.4 lb

## 2017-12-07 DIAGNOSIS — E1139 Type 2 diabetes mellitus with other diabetic ophthalmic complication: Secondary | ICD-10-CM

## 2017-12-07 DIAGNOSIS — F419 Anxiety disorder, unspecified: Principal | ICD-10-CM

## 2017-12-07 DIAGNOSIS — E785 Hyperlipidemia, unspecified: Secondary | ICD-10-CM | POA: Diagnosis not present

## 2017-12-07 DIAGNOSIS — R7989 Other specified abnormal findings of blood chemistry: Secondary | ICD-10-CM

## 2017-12-07 DIAGNOSIS — F32A Depression, unspecified: Secondary | ICD-10-CM

## 2017-12-07 DIAGNOSIS — E538 Deficiency of other specified B group vitamins: Secondary | ICD-10-CM

## 2017-12-07 DIAGNOSIS — Z23 Encounter for immunization: Secondary | ICD-10-CM

## 2017-12-07 DIAGNOSIS — F329 Major depressive disorder, single episode, unspecified: Secondary | ICD-10-CM

## 2017-12-07 DIAGNOSIS — I10 Essential (primary) hypertension: Secondary | ICD-10-CM

## 2017-12-07 DIAGNOSIS — R609 Edema, unspecified: Secondary | ICD-10-CM

## 2017-12-07 LAB — BAYER DCA HB A1C WAIVED: HB A1C: 6 % (ref <7.0)

## 2017-12-07 MED ORDER — LORAZEPAM 0.5 MG PO TABS: 0.5000 mg | ORAL_TABLET | Freq: Two times a day (BID) | ORAL | 0 refills | Status: DC | PRN

## 2017-12-07 MED ORDER — VENLAFAXINE HCL ER 150 MG PO CP24: ORAL_CAPSULE | ORAL | 3 refills | Status: DC

## 2017-12-07 MED ORDER — AMLODIPINE BESYLATE 10 MG PO TABS: 10.0000 mg | ORAL_TABLET | Freq: Every day | ORAL | 3 refills | Status: DC

## 2017-12-07 MED ORDER — ATORVASTATIN CALCIUM 20 MG PO TABS: 20.0000 mg | ORAL_TABLET | Freq: Every day | ORAL | 3 refills | Status: DC

## 2017-12-07 MED ORDER — FUROSEMIDE 20 MG PO TABS: 20.0000 mg | ORAL_TABLET | Freq: Every day | ORAL | 3 refills | Status: DC

## 2017-12-07 NOTE — Progress Notes (Signed)
BP (!) 177/81   Pulse 79   Temp (!) 97.2 F (36.2 C) (Oral)   Ht 5' 4"  (1.626 m)   Wt 230 lb 6.4 oz (104.5 kg)   BMI 39.55 kg/m    Subjective:    Patient ID: Anna Dickerson, female    DOB: December 18, 1929, 84 y.o.   MRN: [id number]  HPI: Anna Dickerson is a 84 y.o. female presenting on 12/07/2017 for Diabetes (3 month followup) and Hypertension   HPI Type 2 diabetes mellitus Patient comes in today for recheck of his diabetes. Patient has been currently taking Trulicity and metformin although we are considering cutting back and stopping the metformin because of renal disease. Patient is not currently on an ACE inhibitor/ARB. Patient has seen an ophthalmologist this year. Patient denies any issues with their feet.  Patient does see nephrology for chronic renal disease and also has some visual problems secondary to diabetes for which she sees an ophthalmologist  Hyperlipidemia Patient is coming in for recheck of his hyperlipidemia. The patient is currently taking atorvastatin. They deny any issues with myalgias or history of liver damage from it. They deny any focal numbness or weakness or chest pain.   Hypertension Patient is currently on clonidine and amlodipine and carvedilol, and their blood pressure today is 177/81, her blood pressures been something we have been struggling with for some time and the nephrology is helping [place] with it. Patient denies any lightheadedness or dizziness. Patient denies headaches, blurred vision, chest pains, shortness of breath, or weakness. Denies any side effects from medication and is content with current medication.  Patient does have peripheral edema which has been improving for her.  She does see nephrology for chronic renal disease  Anxiety depression Patient is coming in for anxiety depression recheck.  She does use the lorazepam for her anxiety and she typically uses it at the 0.5 mg 1-2 times per day and it does work well for her.  She is also  on Effexor for maintenance and she feels like that still doing very well for her and she is on the 150 mg currently.  She denies any major mood issues or suicidal ideations or thoughts of hurting herself.  Depression screen [facility] 2/9 12/07/2017 09/04/2017 06/04/2017 03/05/2017 01/08/2017  Decreased Interest 0 0 0 1 0  Down, Depressed, Hopeless 0 0 0 0 0  PHQ - 2 Score 0 0 0 1 0  Altered sleeping - - - - -  Tired, decreased energy - - - - -  Change in appetite - - - - -  Feeling bad or failure about yourself  - - - - -  Trouble concentrating - - - - -  Moving slowly or fidgety/restless - - - - -  Suicidal thoughts - - - - -  PHQ-9 Score - - - - -  Difficult doing work/chores - - - - -  Some recent data might be hidden     Relevant past medical, surgical, family and social history reviewed and updated as indicated. Interim medical history since our last visit reviewed. Allergies and medications reviewed and updated.  Review of Systems  Constitutional: Negative for chills and fever.  Eyes: Negative for redness and visual disturbance.  Respiratory: Negative for chest tightness and shortness of breath.   Cardiovascular: Positive for leg swelling. Negative for chest pain.  Genitourinary: Negative for decreased urine volume, difficulty urinating and dysuria.  Musculoskeletal: Negative for back pain and gait problem.  Skin: Negative  for rash.  Neurological: Negative for light-headedness and headaches.  Psychiatric/Behavioral: Negative for agitation and behavioral problems.  All other systems reviewed and are negative.   Per HPI unless specifically indicated above   Allergies as of 12/07/2017      Reactions   Sulfa Antibiotics    Mouth ulcers      Medication List        Accurate as of 12/07/17 12:09 PM. Always use your most recent med list.          ACCU-CHEK AVIVA PLUS test strip Generic drug:  glucose blood CHECK BLOOD SUGAR UP TO 3 TIMES A DAY AS DIRECTED   ACCU-CHEK AVIVA  PLUS w/Device Kit Check BS up to three times daily   ACCU-CHEK SOFTCLIX LANCETS lancets CHECK BLOOD SUGAR UP TO 3 TIMES A DAY AS DIRECTED   amLODipine 10 MG tablet Commonly known as:  NORVASC Take 1 tablet (10 mg total) by mouth daily.   aspirin 81 MG tablet Take 81 mg by mouth daily.   atorvastatin 20 MG tablet Commonly known as:  LIPITOR Take 1 tablet (20 mg total) by mouth daily at 6 PM.   carvedilol 25 MG tablet Commonly known as:  COREG   cloNIDine 0.1 MG tablet Commonly known as:  CATAPRES Take 1 tablet (0.1 mg total) by mouth 3 (three) times daily.   cyanocobalamin 1000 MCG/ML injection Commonly known as:  (VITAMIN B-12) Inject 1 mL (1,000 mcg total) every 30 (thirty) days into the muscle.   Fish Oil 1000 MG Caps Take 1,000 mg by mouth 2 (two) times daily.   fluticasone 50 MCG/ACT nasal spray Commonly known as:  FLONASE Place 1 spray into both nostrils 2 (two) times daily as needed for allergies or rhinitis.   furosemide 20 MG tablet Commonly known as:  LASIX Take 1 tablet (20 mg total) by mouth daily.   LORazepam 0.5 MG tablet Commonly known as:  ATIVAN Take 1 tablet (0.5 mg total) by mouth 2 (two) times daily as needed for anxiety.   Melatonin 5 MG Tabs Take 1.5 tablets (7.5 mg total) by mouth at bedtime.   MELATONIN/VITAMIN B-6 EX ST 5-1 MG Tabs Generic drug:  Melatonin-Pyridoxine TAKE ONE TABLET AT BEDTIME   metFORMIN 1000 MG tablet Commonly known as:  GLUCOPHAGE Take 1 Tablet by mouth once daily with breakfast   PRESERVISION AREDS 2 Caps Take 2 capsules by mouth 2 (two) times daily.   TRULICITY 4.62 VO/3.5KK Sopn Generic drug:  Dulaglutide INJECT 0.75 MG ONCE A WEEK   venlafaxine XR 150 MG 24 hr capsule Commonly known as:  EFFEXOR-XR TAKE 1 CAPSULE ONCE DAILY WITH BREAKFAST   Vitamin D (Ergocalciferol) 1.25 MG (50000 UT) Caps capsule Commonly known as:  DRISDOL TAKE 1 CAPSULE ONCE A WEEK            Durable Medical Equipment  (From  admission, onward)         Start     Ordered   12/07/17 0000  DME Other see comment    Comments:  Compression stockings, diagnosis peripheral edema, 15-20 mmHg pressure   12/07/17 1207             Objective:    BP (!) 177/81   Pulse 79   Temp (!) 97.2 F (36.2 C) (Oral)   Ht 5' 4"  (1.626 m)   Wt 230 lb 6.4 oz (104.5 kg)   BMI 39.55 kg/m   Wt Readings from Last 3 Encounters:  12/07/17 230 lb  6.4 oz (104.5 kg)  10/19/17 230 lb 1.6 oz (104.4 kg)  09/04/17 229 lb (103.9 kg)    Physical Exam  Constitutional: She is oriented to person, place, and time. She appears well-developed and well-nourished. No distress.  Eyes: Pupils are equal, round, and reactive to light. Conjunctivae and EOM are normal.  Cardiovascular: Normal rate, regular rhythm, normal heart sounds and intact distal pulses.  No murmur heard. Pulmonary/Chest: Effort normal and breath sounds normal. No respiratory distress. She has no wheezes.  Musculoskeletal: Normal range of motion. She exhibits edema (1+ edema bilaterally). She exhibits no tenderness.  Neurological: She is alert and oriented to person, place, and time. Coordination normal.  Skin: Skin is warm and dry. No rash noted. She is not diaphoretic.  Psychiatric: Her behavior is normal. Her mood appears anxious. She exhibits a depressed mood. She expresses no suicidal ideation. She expresses no suicidal plans.  Nursing note and vitals reviewed.       Assessment & Plan:   Problem List Items Addressed This Visit      Endocrine   DM (diabetes mellitus) ([place]) - Primary   Relevant Medications   atorvastatin (LIPITOR) 20 MG tablet   Other Relevant Orders   Bayer DCA Hb A1c Waived (Completed)    Other Visit Diagnoses    Anxiety and depression       Relevant Medications   venlafaxine XR (EFFEXOR-XR) 150 MG 24 hr capsule   LORazepam (ATIVAN) 0.5 MG tablet   Essential hypertension       Relevant Medications   amLODipine (NORVASC) 10 MG tablet    atorvastatin (LIPITOR) 20 MG tablet   furosemide (LASIX) 20 MG tablet   Other Relevant Orders   CMP14+EGFR (Completed)   Hyperlipidemia LDL goal <100       Relevant Medications   amLODipine (NORVASC) 10 MG tablet   atorvastatin (LIPITOR) 20 MG tablet   furosemide (LASIX) 20 MG tablet   Peripheral edema       Relevant Orders   DME Other see comment   Vitamin B12 deficiency       Relevant Orders   Vitamin B12 (Completed)   Low vitamin D level       Relevant Orders   VITAMIN D 25 Hydroxy (Vit-D Deficiency, Fractures) (Completed)   Encounter for immunization       Relevant Orders   Flu vaccine HIGH DOSE PF (Completed)    Continue with current medications for blood pressure and anxiety, will defer to nephrology for management of blood pressure and bring it down.  We will have her cut metformin in half and use it as a half dose initially.  Follow up plan: Return in about 3 months (around 03/09/2018), or if symptoms worsen or fail to improve, for Diabetes recheck.  Counseling provided for all of the vaccine components Orders Placed This Encounter  Procedures  . DME Other see comment  . CMP14+EGFR  . Bayer DCA Hb A1c Waived  . Vitamin B12  . VITAMIN D 25 Hydroxy (Vit-D Deficiency, Fractures)    Caryl Pina, MD [facility] 12/07/2017, 12:09 PM

## 2017-12-08 LAB — VITAMIN D 25 HYDROXY (VIT D DEFICIENCY, FRACTURES): VIT D 25 HYDROXY: 54.8 ng/mL (ref 30.0–100.0)

## 2017-12-08 LAB — CMP14+EGFR
ALT: 12 IU/L (ref 0–32)
AST: 15 IU/L (ref 0–40)
Albumin/Globulin Ratio: 1.5 (ref 1.2–2.2)
Albumin: 4.3 g/dL (ref 3.5–4.7)
Alkaline Phosphatase: 91 IU/L (ref 39–117)
BILIRUBIN TOTAL: 0.2 mg/dL (ref 0.0–1.2)
BUN/Creatinine Ratio: 21 (ref 12–28)
BUN: 37 mg/dL — ABNORMAL HIGH (ref 8–27)
CHLORIDE: 107 mmol/L — AB (ref 96–106)
CO2: 23 mmol/L (ref 20–29)
Calcium: 9.5 mg/dL (ref 8.7–10.3)
Creatinine, Ser: 1.8 mg/dL — ABNORMAL HIGH (ref 0.57–1.00)
GFR calc Af Amer: 29 mL/min/{1.73_m2} — ABNORMAL LOW (ref >59)
GFR calc non Af Amer: 25 mL/min/{1.73_m2} — ABNORMAL LOW (ref >59)
Globulin, Total: 2.9 g/dL (ref 1.5–4.5)
Glucose: 99 mg/dL (ref 65–99)
POTASSIUM: 4.5 mmol/L (ref 3.5–5.2)
SODIUM: 148 mmol/L — AB (ref 134–144)
Total Protein: 7.2 g/dL (ref 6.0–8.5)

## 2017-12-08 LAB — VITAMIN B12: Vitamin B-12: 813 pg/mL (ref 232–1245)

## 2017-12-09 ENCOUNTER — Telehealth: Payer: Self-pay | Admitting: Family Medicine

## 2017-12-09 NOTE — Telephone Encounter (Signed)
Pt aware of results and recommendations

## 2017-12-14 ENCOUNTER — Other Ambulatory Visit: Payer: Self-pay | Admitting: Family Medicine

## 2017-12-14 DIAGNOSIS — I10 Essential (primary) hypertension: Secondary | ICD-10-CM

## 2017-12-16 ENCOUNTER — Emergency Department (HOSPITAL_BASED_OUTPATIENT_CLINIC_OR_DEPARTMENT_OTHER)
Admission: EM | Admit: 2017-12-16 | Discharge: 2017-12-16 | Disposition: A | Discharge status: Home / Self Care | Payer: Medicare Other | Attending: Emergency Medicine | Admitting: Emergency Medicine

## 2017-12-16 ENCOUNTER — Encounter (HOSPITAL_BASED_OUTPATIENT_CLINIC_OR_DEPARTMENT_OTHER): Payer: Self-pay | Admitting: *Deleted

## 2017-12-16 ENCOUNTER — Emergency Department (HOSPITAL_BASED_OUTPATIENT_CLINIC_OR_DEPARTMENT_OTHER): Payer: Medicare Other

## 2017-12-16 ENCOUNTER — Ambulatory Visit: Payer: Medicare Other | Admitting: Physician Assistant

## 2017-12-16 DIAGNOSIS — N183 Chronic kidney disease, stage 3 (moderate): Secondary | ICD-10-CM | POA: Insufficient documentation

## 2017-12-16 DIAGNOSIS — F329 Major depressive disorder, single episode, unspecified: Secondary | ICD-10-CM | POA: Insufficient documentation

## 2017-12-16 DIAGNOSIS — Y92009 Unspecified place in unspecified non-institutional (private) residence as the place of occurrence of the external cause: Secondary | ICD-10-CM | POA: Insufficient documentation

## 2017-12-16 DIAGNOSIS — Z85828 Personal history of other malignant neoplasm of skin: Secondary | ICD-10-CM | POA: Insufficient documentation

## 2017-12-16 DIAGNOSIS — Z96653 Presence of artificial knee joint, bilateral: Secondary | ICD-10-CM | POA: Diagnosis not present

## 2017-12-16 DIAGNOSIS — W0110XA Fall on same level from slipping, tripping and stumbling with subsequent striking against unspecified object, initial encounter: Secondary | ICD-10-CM | POA: Insufficient documentation

## 2017-12-16 DIAGNOSIS — Y998 Other external cause status: Secondary | ICD-10-CM | POA: Diagnosis not present

## 2017-12-16 DIAGNOSIS — S41111A Laceration without foreign body of right upper arm, initial encounter: Secondary | ICD-10-CM

## 2017-12-16 DIAGNOSIS — I129 Hypertensive chronic kidney disease with stage 1 through stage 4 chronic kidney disease, or unspecified chronic kidney disease: Secondary | ICD-10-CM | POA: Diagnosis not present

## 2017-12-16 DIAGNOSIS — S51011A Laceration without foreign body of right elbow, initial encounter: Secondary | ICD-10-CM | POA: Insufficient documentation

## 2017-12-16 DIAGNOSIS — S51811A Laceration without foreign body of right forearm, initial encounter: Secondary | ICD-10-CM | POA: Diagnosis present

## 2017-12-16 DIAGNOSIS — E1122 Type 2 diabetes mellitus with diabetic chronic kidney disease: Secondary | ICD-10-CM | POA: Insufficient documentation

## 2017-12-16 DIAGNOSIS — Z853 Personal history of malignant neoplasm of breast: Secondary | ICD-10-CM | POA: Diagnosis not present

## 2017-12-16 DIAGNOSIS — Y9389 Activity, other specified: Secondary | ICD-10-CM | POA: Diagnosis not present

## 2017-12-16 MED ORDER — LIDOCAINE-EPINEPHRINE (PF) 2 %-1:200000 IJ SOLN
10.0000 mL | Freq: Once | INTRAMUSCULAR | Status: AC
  Administered 2017-12-16: 10 mL
  Filled 2017-12-16: qty 10

## 2017-12-16 MED ORDER — LIDOCAINE-EPINEPHRINE (PF) 2 %-1:200000 IJ SOLN
INTRAMUSCULAR | Status: AC
  Administered 2017-12-16: 10 mL
  Filled 2017-12-16: qty 10

## 2017-12-16 MED ORDER — LIDOCAINE-EPINEPHRINE 2 %-1:100000 IJ SOLN
20.0000 mL | Freq: Once | INTRAMUSCULAR | Status: DC
  Filled 2017-12-16: qty 20

## 2017-12-16 NOTE — ED Triage Notes (Signed)
Pt fell at 3:30, cut her R arm really bad, she went to [place] and they told her it was too deep for them to to do anything with. She came her for help.

## 2017-12-16 NOTE — ED Notes (Signed)
Patient transported to X-ray 

## 2017-12-16 NOTE — Discharge Instructions (Signed)
Stitches will need to come out in 7 days. Return to the ED with any new or worsening symptoms.

## 2017-12-16 NOTE — ED Provider Notes (Signed)
Emergency Department Provider Note   I have reviewed the triage vital signs and the nursing notes.   HISTORY  Chief Complaint Fall   HPI Anna Dickerson is a 84 y.o. female with PMH of DM, HTN, and CKD presents to the emergency department for evaluation of right elbow/forearm laceration.  The patient had a mechanical fall at home after tripping over a rug and fell to the ground.  She caught herself with the right arm and sustained a laceration.  She initially went to her primary care physician who referred her to the emergency department because of a large wound.  Any pain in this area.  No weakness or numbness in the arm.  No uncontrolled bleeding. Last tetanus in 2018.   Past Medical History:  Diagnosis Date  . B12 deficiency 02/08/2015  . Cancer [facility]) [id number]   breast  . Chronic kidney disease   . Diabetes mellitus   . DM (diabetes mellitus) ([place]) 12/31/2010  . HTN (hypertension) 12/31/2010  . Hypertension   . Iron deficiency 02/24/2012  . Macular degeneration 12/31/2010  . Macular degeneration, left eye   . OAB (overactive bladder)   . Obesity 12/31/2010  . Stage I left-sided Breast cancer 12/31/2010    Patient Active Problem List   Diagnosis Date Noted  . Type 2 diabetes mellitus with diabetic neuropathy, without long-term current use of insulin ([place]) 06/04/2017  . CKD stage 3 due to type 2 diabetes mellitus ([place]) 07/31/2016  . Depression, recurrent ([place]) 03/20/2015  . OAB (overactive bladder) 03/20/2015  . B12 deficiency 02/08/2015  . Iron deficiency anemia due to chronic blood loss 02/02/2013  . Iron deficiency 02/24/2012  . Stage I left-sided Breast cancer 12/31/2010  . Morbid obesity ([place]) 12/31/2010  . Hypertension associated with diabetes ([place]) 12/31/2010  . Macular degeneration 12/31/2010  . DM (diabetes mellitus) ([place]) 12/31/2010    Past Surgical History:  Procedure Laterality Date  . bilateral cataract with intraocular lens implant    . Bladder  tacked-up    . BREAST SURGERY  left    . REPLACEMENT TOTAL KNEE BILATERAL Bilateral    TKR were ar different times about 2 yeras apart.  Marland Kitchen SKIN CANCER EXCISION     squamous cell right ear and calf right leg   Allergies Sulfa antibiotics  Family History  Problem Relation Age of Onset  . Cancer Mother     Social History Social History   Tobacco Use  . Smoking status: Never Smoker  . Smokeless tobacco: Never Used  Substance Use Topics  . Alcohol use: No  . Drug use: No    Review of Systems  Constitutional: No fever/chills Cardiovascular: Denies chest pain. Respiratory: Denies shortness of breath. Gastrointestinal: No abdominal pain.  No nausea, no vomiting.  No diarrhea.  No constipation. Genitourinary: Negative for dysuria. Musculoskeletal: Negative for back pain. Skin: Laceration to the right arm.  Neurological: Negative for headaches, focal weakness or numbness.  10-point ROS otherwise negative.  ____________________________________________   PHYSICAL EXAM:  VITAL SIGNS: ED Triage Vitals  Enc Vitals Group     BP 12/16/17 1751 (!) 192/78     Pulse Rate 12/16/17 1751 87     Resp 12/16/17 1751 16     Temp 12/16/17 1751 97.7 F (36.5 C)     Temp Source 12/16/17 1751 Oral     SpO2 12/16/17 1751 98 %     Weight 12/16/17 1752 230 lb 6.4 oz (104.5 kg)     Height 12/16/17  1752 5\' 4"  (1.626 m)     Pain Score 12/16/17 1752 0    Constitutional: Alert and oriented. Well appearing and in no acute distress. Eyes: Conjunctivae are normal.  Head: Atraumatic. Nose: No congestion/rhinnorhea. Mouth/Throat: Mucous membranes are moist.  Neck: No stridor.   Cardiovascular: Good peripheral circulation.  Respiratory: Normal respiratory effort. Gastrointestinal: No distention.  Musculoskeletal: No lower extremity tenderness nor edema. No gross deformities of extremities. Normal ROM of the right elbow, shoulder, and wrist without bony tenderness.  Neurologic:  Normal speech  and language. No gross focal neurologic deficits are appreciated.  Skin:  Skin is warm and dry. 6 cm laceration to the right proximal forearm through the subcutaneous tissue. No visible muscle or tendon injury.   ____________________________________________  RADIOLOGY  Dg Elbow Complete Right  Result Date: 12/16/2017 CLINICAL DATA:  Initial evaluation for acute trauma, fall. EXAM: RIGHT ELBOW - COMPLETE 3+ VIEW COMPARISON:  None. FINDINGS: No acute fracture or dislocation. No joint effusion. Advanced osteoarthritic changes about the elbow. Soft tissue laceration at the posterior aspect of the elbow. No radiopaque foreign body. IMPRESSION: 1. No acute osseous abnormality about the elbow. 2. Soft tissue laceration at the posterior aspect of the elbow. No radiopaque foreign body. Electronically Signed   By: Jeannine Boga M.D.   On: 12/16/2017 19:55    ____________________________________________   PROCEDURES  Procedure(s) performed:   Marland KitchenMarland KitchenLaceration Repair Date/Time: 12/16/2017 11:32 PM Performed by: Margette Fast, MD Authorized by: Margette Fast, MD   Consent:    Consent obtained:  Verbal   Consent given by:  Patient   Risks discussed:  Infection, need for additional repair, nerve damage, pain, poor wound healing, vascular damage, poor cosmetic result, tendon damage and retained foreign body   Alternatives discussed:  No treatment Anesthesia (see MAR for exact dosages):    Anesthesia method:  Local infiltration   Local anesthetic:  Lidocaine 2% WITH epi Laceration details:    Location:  Shoulder/arm   Shoulder/arm location:  R elbow   Length (cm):  6 Repair type:    Repair type:  Intermediate Pre-procedure details:    Preparation:  Patient was prepped and draped in usual sterile fashion and imaging obtained to evaluate for foreign bodies Exploration:    Hemostasis achieved with:  Direct pressure   Wound exploration: wound explored through full range of motion and entire  depth of wound probed and visualized     Wound extent: no foreign bodies/material noted, no muscle damage noted, no nerve damage noted, no tendon damage noted, no underlying fracture noted and no vascular damage noted     Contaminated: no   Treatment:    Area cleansed with:  Betadine and saline   Amount of cleaning:  Standard   Irrigation solution:  Sterile saline   Irrigation method:  Pressure wash   Visualized foreign bodies/material removed: no   Skin repair:    Repair method:  Sutures   Suture size:  4-0   Suture material:  Prolene   Suture technique:  Simple interrupted   Number of sutures:  6 Approximation:    Approximation:  Close Post-procedure details:    Dressing:  Open (no dressing)   Patient tolerance of procedure:  Tolerated well, no immediate complications    ____________________________________________   INITIAL IMPRESSION / ASSESSMENT AND PLAN / ED COURSE  Pertinent labs & imaging results that were available during my care of the patient were reviewed by me and considered in my medical decision making (  see chart for details).  Patient presents to the emergency department for evaluation of fall with right elbow laceration.  The wound does not involve any tendon, nerve, or muscle.  Patient has normal strength and sensation in the arm.  Plain film reviewed with no fracture or foreign body.  The laceration was repaired as above.  No complications.  Advised return in 7 days for suture removal and discussed basic wound care at home.    ____________________________________________  FINAL CLINICAL IMPRESSION(S) / ED DIAGNOSES  Final diagnoses:  Elbow laceration, right, initial encounter     MEDICATIONS GIVEN DURING THIS VISIT:  Medications  lidocaine-EPINEPHrine (XYLOCAINE W/EPI) 2 %-1:200000 (PF) injection 10 mL (10 mLs Infiltration Given by Other 12/16/17 2034)     Note:  This document was prepared using Dragon voice recognition software and may include  unintentional dictation errors.  Nanda Quinton, MD Emergency Medicine    Long, Wonda Old[facility], MD 12/17/17 [phone]

## 2017-12-16 NOTE — ED Notes (Signed)
Suture cart at room

## 2017-12-16 NOTE — ED Notes (Signed)
ED Provider at bedside. 

## 2017-12-21 NOTE — Progress Notes (Signed)
She ended up just being triaged and sent to ED. I went into peek at her laceration, but it was very deep.

## 2017-12-23 ENCOUNTER — Other Ambulatory Visit: Payer: Medicare Other

## 2017-12-24 ENCOUNTER — Ambulatory Visit: Payer: Medicare Other | Admitting: Family Medicine

## 2017-12-24 ENCOUNTER — Encounter: Payer: Self-pay | Admitting: Family Medicine

## 2017-12-24 VITALS — BP 163/69 | HR 80 | Temp 96.8°F | Ht 64.0 in | Wt 235.0 lb

## 2017-12-24 DIAGNOSIS — Z4802 Encounter for removal of sutures: Secondary | ICD-10-CM

## 2017-12-24 DIAGNOSIS — S51011A Laceration without foreign body of right elbow, initial encounter: Secondary | ICD-10-CM

## 2017-12-24 DIAGNOSIS — S51011D Laceration without foreign body of right elbow, subsequent encounter: Secondary | ICD-10-CM

## 2017-12-24 NOTE — Patient Instructions (Signed)
Suture Removal, Care After Refer to this sheet in the next few weeks. These instructions provide you with information on caring for yourself after your procedure. Your health care provider may also give you more specific instructions. Your treatment has been planned according to current medical practices, but problems sometimes occur. Call your health care provider if you have any problems or questions after your procedure. What can I expect after the procedure? After your stitches (sutures) are removed, it is typical to have the following:  Some discomfort and swelling in the wound area.  Slight redness in the area.  Follow these instructions at home:  If you have skin adhesive strips over the wound area, do not take the strips off. They will fall off on their own in a few days. If the strips remain in place after 14 days, you may remove them.  Change any bandages (dressings) at least once a day or as directed by your health care provider. If the bandage sticks, soak it off with warm, soapy water.  Apply cream or ointment only as directed by your health care provider. If using cream or ointment, wash the area with soap and water 2 times a day to remove all the cream or ointment. Rinse off the soap and pat the area dry with a clean towel.  Keep the wound area dry and clean. If the bandage becomes wet or dirty, or if it develops a bad smell, change it as soon as possible.  Continue to protect the wound from injury.  Use sunscreen when out in the sun. New scars become sunburned easily. Contact a health care provider if:  You have increasing redness, swelling, or pain in the wound.  You see pus coming from the wound.  You have a fever.  You notice a bad smell coming from the wound or dressing.  Your wound breaks open (edges not staying together). This information is not intended to replace advice given to you by your health care provider. Make sure you discuss any questions you have  with your health care provider. Document Released: 09/24/2000 Document Revised: 06/07/2015 Document Reviewed: 08/11/2012 Elsevier Interactive Patient Education  2017 Elsevier Inc.  

## 2017-12-24 NOTE — Progress Notes (Signed)
Subjective: CC: suture removal PCP: Dettinger, Fransisca Kaufmann, MD WOE:[id number] Anna Dickerson is a 84 y.o. female presenting to clinic today for:  1. Suture removal On 12/16/2017 she lacerated the right elbow.  She had 6 Prolene sutures placed and is now here to have these removed.  Denies any exudate, purulence, fevers, chills.  She has full active range of motion of right upper extremity.   ROS: Per HPI  Allergies  Allergen Reactions  . Sulfa Antibiotics     Mouth ulcers   Past Medical History:  Diagnosis Date  . B12 deficiency 02/08/2015  . Cancer [facility]) [id number]   breast  . Chronic kidney disease   . Diabetes mellitus   . DM (diabetes mellitus) ([place]) 12/31/2010  . HTN (hypertension) 12/31/2010  . Hypertension   . Iron deficiency 02/24/2012  . Macular degeneration 12/31/2010  . Macular degeneration, left eye   . OAB (overactive bladder)   . Obesity 12/31/2010  . Stage I left-sided Breast cancer 12/31/2010    Current Outpatient Medications:  .  ACCU-CHEK AVIVA PLUS test strip, CHECK BLOOD SUGAR UP TO 3 TIMES A DAY AS DIRECTED, Disp: 300 each, Rfl: 3 .  ACCU-CHEK SOFTCLIX LANCETS lancets, CHECK BLOOD SUGAR UP TO 3 TIMES A DAY AS DIRECTED, Disp: 300 each, Rfl: 3 .  amLODipine (NORVASC) 10 MG tablet, Take 1 tablet (10 mg total) by mouth daily., Disp: 90 tablet, Rfl: 3 .  aspirin 81 MG tablet, Take 81 mg by mouth daily.  , Disp: , Rfl:  .  atorvastatin (LIPITOR) 20 MG tablet, Take 1 tablet (20 mg total) by mouth daily at 6 PM., Disp: 90 tablet, Rfl: 3 .  Blood Glucose Monitoring Suppl (ACCU-CHEK AVIVA PLUS) w/Device KIT, Check BS up to three times daily, Disp: 1 kit, Rfl: 0 .  carvedilol (COREG) 25 MG tablet, , Disp: , Rfl:  .  cloNIDine (CATAPRES) 0.1 MG tablet, Take 1 tablet (0.1 mg total) by mouth 3 (three) times daily., Disp: 90 tablet, Rfl: 3 .  cyanocobalamin (,VITAMIN B-12,) 1000 MCG/ML injection, Inject 1 mL (1,000 mcg total) every 30 (thirty) days into the muscle., Disp: 1 mL,  Rfl: 0 .  fluticasone (FLONASE) 50 MCG/ACT nasal spray, Place 1 spray into both nostrils 2 (two) times daily as needed for allergies or rhinitis., Disp: 16 g, Rfl: 6 .  furosemide (LASIX) 20 MG tablet, Take 1 tablet (20 mg total) by mouth daily., Disp: 90 tablet, Rfl: 3 .  LORazepam (ATIVAN) 0.5 MG tablet, Take 1 tablet (0.5 mg total) by mouth 2 (two) times daily as needed for anxiety., Disp: 60 tablet, Rfl: 0 .  Melatonin 5 MG TABS, Take 1.5 tablets (7.5 mg total) by mouth at bedtime., Disp: 30 tablet, Rfl: 6 .  MELATONIN/VITAMIN B-6 EX ST 5-1 MG TABS, TAKE ONE TABLET AT BEDTIME, Disp: 30 tablet, Rfl: 11 .  metFORMIN (GLUCOPHAGE) 1000 MG tablet, Take 1 Tablet by mouth once daily with breakfast, Disp: 90 tablet, Rfl: 0 .  Multiple Vitamins-Minerals (PRESERVISION AREDS 2) CAPS, Take 2 capsules by mouth 2 (two) times daily., Disp: , Rfl:  .  Omega-3 Fatty Acids (FISH OIL) 1000 MG CAPS, Take 1,000 mg by mouth 2 (two) times daily., Disp: , Rfl:  .  TRULICITY 0.03 BC/4.8GQ SOPN, INJECT 0.75 MG ONCE A WEEK, Disp: 2 mL, Rfl: 0 .  venlafaxine XR (EFFEXOR-XR) 150 MG 24 hr capsule, TAKE 1 CAPSULE ONCE DAILY WITH BREAKFAST, Disp: 90 capsule, Rfl: 3 .  Vitamin D, Ergocalciferol, (DRISDOL)  50000 units CAPS capsule, TAKE 1 CAPSULE ONCE A WEEK, Disp: 4 capsule, Rfl: 0 Social History   Socioeconomic History  . Marital status: Married    Spouse name: Not on file  . Number of children: 4  . Years of education: Not on file  . Highest education level: Not on file  Occupational History  . Not on file  Social Needs  . Financial resource strain: Not hard at all  . Food insecurity:    Worry: Never true    Inability: Never true  . Transportation needs:    Medical: No    Non-medical: No  Tobacco Use  . Smoking status: Never Smoker  . Smokeless tobacco: Never Used  Substance and Sexual Activity  . Alcohol use: No  . Drug use: No  . Sexual activity: Never  Lifestyle  . Physical activity:    Days per week:  3 days    Minutes per session: 30 min  . Stress: Only a little  Relationships  . Social connections:    Talks on phone: More than three times a week    Gets together: More than three times a week    Attends religious service: More than 4 times per year    Active member of club or organization: Yes    Attends meetings of clubs or organizations: More than 4 times per year    Relationship status: Widowed  . Intimate partner violence:    Fear of current or ex partner: Not on file    Emotionally abused: Not on file    Physically abused: Not on file    Forced sexual activity: Not on file  Other Topics Concern  . Not on file  Social History Narrative  . Not on file   Family History  Problem Relation Age of Onset  . Cancer Mother     Objective: Office vital signs reviewed. BP (!) 163/69 (BP Location: Left Wrist, Patient Position: Sitting, Cuff Size: Normal)   Pulse 80   Temp (!) 96.8 F (36 Anna)   Ht _0  (1.626 m)   Wt 235 lb (106.6 kg)   BMI 40.34 kg/m   Physical Examination:  General: Awake, alert, well nourished, No acute distress Right upper extremity: large healing laceration along the right elbow. Small dehiscence noted.  6 prolene sutures in place.  Assessment/ Plan: 84 y.o. female   1. Laceration of right elbow, subsequent encounter No evidence of secondary bacterial infection.  She does have slight dehiscence of the wound.  I did go ahead and remove the Prolene sutures.  The dehiscence seems to be superficial.  I have placed 6 Steri-Strips in place to keep skin approximated.  We discussed that these would follow-up in 4 to 5 days.  Avoid bath tubs and submersion of the wound.  Wound care discussed and handout provided.  Follow-up PRN.  2. Visit for suture removal   S[place], [place] [phone]

## 2018-01-05 ENCOUNTER — Other Ambulatory Visit: Payer: Self-pay | Admitting: Family Medicine

## 2018-01-05 DIAGNOSIS — E538 Deficiency of other specified B group vitamins: Secondary | ICD-10-CM

## 2018-01-11 ENCOUNTER — Other Ambulatory Visit: Payer: Self-pay | Admitting: Family Medicine

## 2018-01-11 DIAGNOSIS — I10 Essential (primary) hypertension: Secondary | ICD-10-CM

## 2018-01-14 ENCOUNTER — Other Ambulatory Visit (HOSPITAL_COMMUNITY): Payer: Self-pay | Admitting: Nephrology

## 2018-01-14 DIAGNOSIS — I10 Essential (primary) hypertension: Secondary | ICD-10-CM

## 2018-01-22 ENCOUNTER — Other Ambulatory Visit: Payer: Self-pay | Admitting: Family Medicine

## 2018-01-22 ENCOUNTER — Ambulatory Visit (HOSPITAL_COMMUNITY)
Admission: RE | Admit: 2018-01-22 | Discharge: 2018-01-22 | Disposition: A | Discharge status: Home / Self Care | Payer: Medicare Other | Source: Ambulatory Visit | Attending: Family Medicine | Admitting: Family Medicine

## 2018-01-22 DIAGNOSIS — I10 Essential (primary) hypertension: Secondary | ICD-10-CM | POA: Insufficient documentation

## 2018-01-22 NOTE — Progress Notes (Signed)
*  PRELIMINARY RESULTS* Echocardiogram 2D Echocardiogram has been performed.  Samuel Germany 01/22/2018, 10:05 AM

## 2018-02-10 ENCOUNTER — Telehealth: Payer: Self-pay

## 2018-02-11 ENCOUNTER — Ambulatory Visit: Payer: Self-pay | Admitting: Licensed Clinical Social Worker

## 2018-02-11 DIAGNOSIS — F419 Anxiety disorder, unspecified: Secondary | ICD-10-CM

## 2018-02-11 DIAGNOSIS — F329 Major depressive disorder, single episode, unspecified: Secondary | ICD-10-CM

## 2018-02-11 DIAGNOSIS — F339 Major depressive disorder, recurrent, unspecified: Secondary | ICD-10-CM

## 2018-02-11 DIAGNOSIS — F32A Depression, unspecified: Secondary | ICD-10-CM

## 2018-02-11 DIAGNOSIS — E1139 Type 2 diabetes mellitus with other diabetic ophthalmic complication: Secondary | ICD-10-CM

## 2018-02-11 DIAGNOSIS — I10 Essential (primary) hypertension: Secondary | ICD-10-CM

## 2018-02-11 NOTE — Patient Instructions (Signed)
Ms. Anna Dickerson, daughter were given information about Chronic Care Management services today including:  1. CCM service includes personalized support from designated clinical staff supervised by her physician, including individualized plan of care and coordination with other care providers 2. 24/7 contact phone numbers for assistance for urgent and routine care needs. 3. Service will only be billed when office clinical staff spend 20 minutes or more in a month to coordinate care. 4. Only one practitioner may furnish and bill the service in a calendar month. 5. The patient may stop CCM services at any time (effective at the end of the month) by phone call to the office staff. 6. The patient will be responsible for cost sharing (co-pay) of up to 20% of the service fee (after annual deductible is met).  [name], daughter, did not agree to services and does not wish to consider at this time.  Follow Up Plan:  Client to attend medical appointments with Dr. Warrick Parisian as scheduled.  The patient verbalized understanding of instructions provided today and declined a print copy of patient instruction materials.   Norva Riffle.Gary Bultman MSW, LCSW Licensed Clinical Social Worker [facility] Care Management [phone]

## 2018-02-11 NOTE — Chronic Care Management (AMB) (Signed)
Chronic Care Management    Clinical Social Work General Note  02/11/2018 Name: Anna Dickerson MRN: [id number] DOB: 1929/05/08   Referred by: PCP, Dr.Dettinger for pychosocial assessment  Ms. Anna Dickerson Anna Dickerson, daughter were given information about Chronic Care Management services today including:  1. CCM service includes personalized support from designated clinical staff supervised by her physician, including individualized plan of care and coordination with other care providers 2. 24/7 contact phone numbers for assistance for urgent and routine care needs. 3. Service will only be billed when office clinical staff spend 20 minutes or more in a month to coordinate care. 4. Only one practitioner may furnish and bill the service in a calendar month. 5. The patient may stop CCM services at any time (effective at the end of the month) by phone call to the office staff. 6. The patient will be responsible for cost sharing (co-pay) of up to 20% of the service fee (after annual deductible is met).  Patient /Anna Dickerson, daughter, did not agree to services and do not wish to consider at this time.  Review of patient status, including review of consultants reports, relevant laboratory and other test results, and collaboration with appropriate care team members and the patient's provider was performed as part of comprehensive patient evaluation and provision of chronic care management services.    Last CCM Appointment: 02/11/2018  Depression screen [facility] 2/9 12/07/2017 09/04/2017 06/04/2017  Decreased Interest 0 0 0  Down, Depressed, Hopeless 0 0 0  PHQ - 2 Score 0 0 0  Altered sleeping - - -  Tired, decreased energy - - -  Change in appetite - - -  Feeling bad or failure about yourself  - - -  Trouble concentrating - - -  Moving slowly or fidgety/restless - - -  Suicidal thoughts - - -  PHQ-9 Score - - -  Difficult doing work/chores - - -  Some recent data might be hidden     Follow Up Plan: Client  to attend medical appointments as scheduled with Dr. Scherrie Gerlach.Anna Dickerson MSW, LCSW Licensed Clinical Social Worker [facility] [phone]

## 2018-02-22 ENCOUNTER — Other Ambulatory Visit: Payer: Medicare Other

## 2018-02-24 ENCOUNTER — Other Ambulatory Visit: Payer: Self-pay | Admitting: Family Medicine

## 2018-02-24 NOTE — Telephone Encounter (Signed)
Last seen 12/14/17 

## 2018-03-08 ENCOUNTER — Encounter: Payer: Self-pay | Admitting: Family Medicine

## 2018-03-10 ENCOUNTER — Other Ambulatory Visit: Payer: Self-pay | Admitting: Family Medicine

## 2018-03-10 ENCOUNTER — Ambulatory Visit: Payer: Medicare Other | Admitting: Family Medicine

## 2018-03-10 ENCOUNTER — Ambulatory Visit (INDEPENDENT_AMBULATORY_CARE_PROVIDER_SITE_OTHER): Payer: Medicare Other

## 2018-03-10 ENCOUNTER — Encounter: Payer: Self-pay | Admitting: Family Medicine

## 2018-03-10 VITALS — BP 175/87 | HR 83 | Temp 97.0°F | Ht 64.0 in | Wt 228.8 lb

## 2018-03-10 DIAGNOSIS — N183 Chronic kidney disease, stage 3 (moderate): Secondary | ICD-10-CM

## 2018-03-10 DIAGNOSIS — E1122 Type 2 diabetes mellitus with diabetic chronic kidney disease: Secondary | ICD-10-CM

## 2018-03-10 DIAGNOSIS — M8580 Other specified disorders of bone density and structure, unspecified site: Secondary | ICD-10-CM

## 2018-03-10 DIAGNOSIS — I152 Hypertension secondary to endocrine disorders: Secondary | ICD-10-CM

## 2018-03-10 DIAGNOSIS — E1139 Type 2 diabetes mellitus with other diabetic ophthalmic complication: Secondary | ICD-10-CM

## 2018-03-10 DIAGNOSIS — M8588 Other specified disorders of bone density and structure, other site: Secondary | ICD-10-CM | POA: Diagnosis not present

## 2018-03-10 DIAGNOSIS — E1140 Type 2 diabetes mellitus with diabetic neuropathy, unspecified: Secondary | ICD-10-CM

## 2018-03-10 DIAGNOSIS — E1159 Type 2 diabetes mellitus with other circulatory complications: Secondary | ICD-10-CM | POA: Diagnosis not present

## 2018-03-10 DIAGNOSIS — I10 Essential (primary) hypertension: Secondary | ICD-10-CM

## 2018-03-10 DIAGNOSIS — Z13820 Encounter for screening for osteoporosis: Secondary | ICD-10-CM

## 2018-03-10 DIAGNOSIS — E538 Deficiency of other specified B group vitamins: Secondary | ICD-10-CM

## 2018-03-10 LAB — BAYER DCA HB A1C WAIVED: HB A1C (BAYER DCA - WAIVED): 6.8 % (ref <7.0)

## 2018-03-10 NOTE — Progress Notes (Signed)
BP (!) 175/87   Pulse 83   Temp (!) 97 F (36.1 C) (Oral)   Ht 5' 4"  (1.626 m)   Wt 228 lb 12.8 oz (103.8 kg)   BMI 39.27 kg/m    Subjective:    Patient ID: Anna Dickerson, female    DOB: 11/14/1929, 85 y.o.   MRN: [id number]  HPI: Anna Dickerson is a 85 y.o. female presenting on 03/10/2018 for Diabetes (3 month follow up); Hypertension; and Constipation   HPI Type 2 diabetes mellitus Patient comes in today for recheck of his diabetes. Patient has been currently taking Trulicity, stop metformin because of renal damage. Patient is not currently on an ACE inhibitor/ARB. Patient has not seen an ophthalmologist this year. Patient denies any issues with their feet.  Patient has stage III CKD and has nephrology  Hypertension Patient is currently on amlodipine and carvedilol and clonidine, and their blood pressure today is 175/87, we have had difficulty controlling her blood pressure for some time and mainly she is seeing nephrology for this. Patient denies any lightheadedness or dizziness. Patient denies headaches, blurred vision, chest pains, shortness of breath, or weakness. Denies any side effects from medication and is content with current medication.  We have been trying to control her blood pressure but mostly renal has been taking over and we are going to defer to nephrology for this.  Hyperlipidemia Patient is coming in for recheck of his hyperlipidemia. The patient is currently taking fish oil and Lipitor. They deny any issues with myalgias or history of liver damage from it. They deny any focal numbness or weakness or chest pain.   Patient needs osteoporosis screening   Relevant past medical, surgical, family and social history reviewed and updated as indicated. Interim medical history since our last visit reviewed. Allergies and medications reviewed and updated.  Review of Systems  Constitutional: Negative for chills and fever.  HENT: Negative for congestion, ear  discharge and ear pain.   Eyes: Negative for redness and visual disturbance.  Respiratory: Negative for chest tightness and shortness of breath.   Cardiovascular: Negative for chest pain and leg swelling.  Genitourinary: Negative for difficulty urinating and dysuria.  Musculoskeletal: Negative for back pain and gait problem.  Skin: Negative for rash.  Neurological: Negative for light-headedness and headaches.  Psychiatric/Behavioral: Negative for agitation and behavioral problems.  All other systems reviewed and are negative.   Per HPI unless specifically indicated above   Allergies as of 03/10/2018      Reactions   Sulfa Antibiotics    Mouth ulcers      Medication List       Accurate as of March 10, 2018  2:34 PM. Always use your most recent med list.        ACCU-CHEK AVIVA PLUS test strip Generic drug:  glucose blood CHECK BLOOD SUGAR UP TO 3 TIMES A DAY AS DIRECTED   ACCU-CHEK AVIVA PLUS w/Device Kit Check BS up to three times daily   ACCU-CHEK SOFTCLIX LANCETS lancets CHECK BLOOD SUGAR UP TO 3 TIMES A DAY AS DIRECTED   amLODipine 10 MG tablet Commonly known as:  NORVASC Take 1 tablet (10 mg total) by mouth daily.   aspirin 81 MG tablet Take 81 mg by mouth daily.   atorvastatin 20 MG tablet Commonly known as:  LIPITOR Take 1 tablet (20 mg total) by mouth daily at 6 PM.   carvedilol 25 MG tablet Commonly known as:  COREG   cloNIDine 0.1 MG  tablet Commonly known as:  CATAPRES Take 1 tablet (0.1 mg total) by mouth 3 (three) times daily.   Fish Oil 1000 MG Caps Take 1,000 mg by mouth 2 (two) times daily.   fluticasone 50 MCG/ACT nasal spray Commonly known as:  FLONASE Place 1 spray into both nostrils 2 (two) times daily as needed for allergies or rhinitis.   furosemide 20 MG tablet Commonly known as:  LASIX Take 1 tablet (20 mg total) by mouth daily.   LORazepam 0.5 MG tablet Commonly known as:  ATIVAN Take 1 tablet (0.5 mg total) by mouth 2 (two)  times daily as needed for anxiety.   Melatonin 5 MG Tabs Take 1.5 tablets (7.5 mg total) by mouth at bedtime.   MELATONIN/VITAMIN B-6 EX ST 5-1 MG Tabs Generic drug:  Melatonin-Pyridoxine TAKE ONE TABLET AT BEDTIME   PRESERVISION AREDS 2 Caps Take 2 capsules by mouth 2 (two) times daily.   TRULICITY 3.66 QH/4.7ML Sopn Generic drug:  Dulaglutide INJECT 0.75 MG ONCE A WEEK   venlafaxine XR 150 MG 24 hr capsule Commonly known as:  EFFEXOR-XR TAKE 1 CAPSULE ONCE DAILY WITH BREAKFAST   Vitamin D (Ergocalciferol) 1.25 MG (50000 UT) Caps capsule Commonly known as:  DRISDOL TAKE 1 CAPSULE ONCE A WEEK          Objective:    BP (!) 175/87   Pulse 83   Temp (!) 97 F (36.1 C) (Oral)   Ht 5' 4"  (1.626 m)   Wt 228 lb 12.8 oz (103.8 kg)   BMI 39.27 kg/m   Wt Readings from Last 3 Encounters:  03/10/18 228 lb 12.8 oz (103.8 kg)  12/24/17 235 lb (106.6 kg)  12/16/17 230 lb 6.4 oz (104.5 kg)    Physical Exam Vitals signs and nursing note reviewed.  Constitutional:      General: She is not in acute distress.    Appearance: She is well-developed. She is not diaphoretic.  Eyes:     Conjunctiva/sclera: Conjunctivae normal.  Cardiovascular:     Rate and Rhythm: Normal rate and regular rhythm.     Heart sounds: Normal heart sounds. No murmur.  Pulmonary:     Effort: Pulmonary effort is normal. No respiratory distress.     Breath sounds: Normal breath sounds. No wheezing.  Musculoskeletal: Normal range of motion.        General: No tenderness.  Skin:    General: Skin is warm and dry.     Findings: No rash.  Neurological:     Mental Status: She is alert and oriented to person, place, and time.     Coordination: Coordination normal.  Psychiatric:        Behavior: Behavior normal.         Assessment & Plan:   Problem List Items Addressed This Visit      Cardiovascular and Mediastinum   Hypertension associated with diabetes ([place])   Relevant Orders   CBC with  Differential/Platelet   CMP14+EGFR     Endocrine   DM (diabetes mellitus) ([place])   Relevant Orders   CBC with Differential/Platelet   CMP14+EGFR   Lipid panel   Bayer DCA Hb A1c Waived   CBC with Differential/Platelet   CMP14+EGFR   Lipid panel   CKD stage 3 due to type 2 diabetes mellitus ([facility])   Relevant Orders   CBC with Differential/Platelet   CMP14+EGFR   Type 2 diabetes mellitus with diabetic neuropathy, without long-term current use of insulin ([facility]) - Primary  Relevant Orders   CBC with Differential/Platelet   CMP14+EGFR   Lipid panel   Bayer DCA Hb A1c Waived    Other Visit Diagnoses    Osteoporosis screening       Relevant Orders   DG Bone Density   Vitamin B12 deficiency       Relevant Orders   Vitamin B12      Follow up plan: Return in about 3 months (around 06/08/2018), or if symptoms worsen or fail to improve, for Attention and diabetes recheck.  Counseling provided for all of the vaccine components Orders Placed This Encounter  Procedures  . DG Bone Density  . CBC with Differential/Platelet  . CMP14+EGFR  . Lipid panel  . Bayer [facility] Hb A1c [place], MD [facility] 03/10/2018, 2:34 PM

## 2018-03-11 LAB — CBC WITH DIFFERENTIAL/PLATELET
Basophils Absolute: 0.1 10*3/uL (ref 0.0–0.2)
Basos: 1 %
EOS (ABSOLUTE): 0.4 10*3/uL (ref 0.0–0.4)
Eos: 4 %
Hematocrit: 34.1 % (ref 34.0–46.6)
Hemoglobin: 11.7 g/dL (ref 11.1–15.9)
Immature Grans (Abs): 0.1 10*3/uL (ref 0.0–0.1)
Immature Granulocytes: 1 %
Lymphocytes Absolute: 2.4 10*3/uL (ref 0.7–3.1)
Lymphs: 24 %
MCH: 29.4 pg (ref 26.6–33.0)
MCHC: 34.3 g/dL (ref 31.5–35.7)
MCV: 86 fL (ref 79–97)
Monocytes Absolute: 0.7 10*3/uL (ref 0.1–0.9)
Monocytes: 7 %
Neutrophils Absolute: 6.6 10*3/uL (ref 1.4–7.0)
Neutrophils: 63 %
PLATELETS: 218 10*3/uL (ref 150–450)
RBC: 3.98 x10E6/uL (ref 3.77–5.28)
RDW: 13.2 % (ref 11.7–15.4)
WBC: 10.3 10*3/uL (ref 3.4–10.8)

## 2018-03-11 LAB — LIPID PANEL
Chol/HDL Ratio: 4.1 ratio (ref 0.0–4.4)
Cholesterol, Total: 142 mg/dL (ref 100–199)
HDL: 35 mg/dL — ABNORMAL LOW (ref >39)
LDL CALC: 47 mg/dL (ref 0–99)
Triglycerides: 300 mg/dL — ABNORMAL HIGH (ref 0–149)
VLDL Cholesterol Cal: 60 mg/dL — ABNORMAL HIGH (ref 5–40)

## 2018-03-11 LAB — CMP14+EGFR
ALT: 13 IU/L (ref 0–32)
AST: 12 IU/L (ref 0–40)
Albumin/Globulin Ratio: 1.2 (ref 1.2–2.2)
Albumin: 3.9 g/dL (ref 3.6–4.6)
Alkaline Phosphatase: 103 IU/L (ref 39–117)
BUN/Creatinine Ratio: 16 (ref 12–28)
BUN: 32 mg/dL — AB (ref 8–27)
Bilirubin Total: 0.3 mg/dL (ref 0.0–1.2)
CO2: 23 mmol/L (ref 20–29)
CREATININE: 1.96 mg/dL — AB (ref 0.57–1.00)
Calcium: 9.1 mg/dL (ref 8.7–10.3)
Chloride: 102 mmol/L (ref 96–106)
GFR calc Af Amer: 26 mL/min/{1.73_m2} — ABNORMAL LOW (ref >59)
GFR calc non Af Amer: 23 mL/min/{1.73_m2} — ABNORMAL LOW (ref >59)
Globulin, Total: 3.2 g/dL (ref 1.5–4.5)
Glucose: 115 mg/dL — ABNORMAL HIGH (ref 65–99)
Potassium: 3.9 mmol/L (ref 3.5–5.2)
Sodium: 143 mmol/L (ref 134–144)
Total Protein: 7.1 g/dL (ref 6.0–8.5)

## 2018-03-11 LAB — VITAMIN B12: Vitamin B-12: 624 pg/mL (ref 232–1245)

## 2018-03-29 ENCOUNTER — Ambulatory Visit (INDEPENDENT_AMBULATORY_CARE_PROVIDER_SITE_OTHER): Payer: Medicare Other | Admitting: *Deleted

## 2018-03-29 ENCOUNTER — Encounter: Payer: Self-pay | Admitting: *Deleted

## 2018-03-29 ENCOUNTER — Other Ambulatory Visit: Payer: Self-pay

## 2018-03-29 VITALS — BP 175/73 | HR 76 | Ht 64.0 in | Wt 229.0 lb

## 2018-03-29 DIAGNOSIS — Z0000 Encounter for general adult medical examination without abnormal findings: Secondary | ICD-10-CM

## 2018-03-29 NOTE — Patient Instructions (Addendum)
Please work on your goal of exercising 3 times per week for 30 minutes each session.  Chair exercises are a great option.    At your convenience, please bring a copy of your Advance Directives (Healthcare Power of Attorney and Living Will) to our office to be filed in your medical record.  Please continue to move carefully to avoid falls.   Please follow up with Dr. Warrick Parisian and other specialists as scheduled.  You have been scheduled for your eye exam at [facility] in [place] on 06/16/2018 at 10:40 am.  Thank you for coming in for your Annual Wellness visit today!!      Fall Prevention in the Home, Adult Falls can cause injuries. They can happen to people of all ages. There are many things you can do to make your home safe and to help prevent falls. Ask for help when making these changes, if needed. What actions can I take to prevent falls? General Instructions  Use good lighting in all rooms. Replace any light bulbs that burn out.  Turn on the lights when you go into a dark area. Use night-lights.  Keep items that you use often in easy-to-reach places. Lower the shelves around your home if necessary.  Set up your furniture so you have a clear path. Avoid moving your furniture around.  Do not have throw rugs and other things on the floor that can make you trip.  Avoid walking on wet floors.  If any of your floors are uneven, fix them.  Add color or contrast paint or tape to clearly mark and help you see: ? Any grab bars or handrails. ? First and last steps of stairways. ? Where the edge of each step is.  If you use a stepladder: ? Make sure that it is fully opened. Do not climb a closed stepladder. ? Make sure that both sides of the stepladder are locked into place. ? Ask someone to hold the stepladder for you while you use it.  If there are any pets around you, be aware of where they are. What can I do in the bathroom?      Keep the floor dry. Clean up any water  that spills onto the floor as soon as it happens.  Remove soap buildup in the tub or shower regularly.  Use non-skid mats or decals on the floor of the tub or shower.  Attach bath mats securely with double-sided, non-slip rug tape.  If you need to sit down in the shower, use a plastic, non-slip stool.  Install grab bars by the toilet and in the tub and shower. Do not use towel bars as grab bars. What can I do in the bedroom?  Make sure that you have a light by your bed that is easy to reach.  Do not use any sheets or blankets that are too big for your bed. They should not hang down onto the floor.  Have a firm chair that has side arms. You can use this for support while you get dressed. What can I do in the kitchen?  Clean up any spills right away.  If you need to reach something above you, use a strong step stool that has a grab bar.  Keep electrical cords out of the way.  Do not use floor polish or wax that makes floors slippery. If you must use wax, use non-skid floor wax. What can I do with my stairs?  Do not leave any items on  the stairs.  Make sure that you have a light switch at the top of the stairs and the bottom of the stairs. If you do not have them, ask someone to add them for you.  Make sure that there are handrails on both sides of the stairs, and use them. Fix handrails that are broken or loose. Make sure that handrails are as long as the stairways.  Install non-slip stair treads on all stairs in your home.  Avoid having throw rugs at the top or bottom of the stairs. If you do have throw rugs, attach them to the floor with carpet tape.  Choose a carpet that does not hide the edge of the steps on the stairway.  Check any carpeting to make sure that it is firmly attached to the stairs. Fix any carpet that is loose or worn. What can I do on the outside of my home?  Use bright outdoor lighting.  Regularly fix the edges of walkways and driveways and fix any  cracks.  Remove anything that might make you trip as you walk through a door, such as a raised step or threshold.  Trim any bushes or trees on the path to your home.  Regularly check to see if handrails are loose or broken. Make sure that both sides of any steps have handrails.  Install guardrails along the edges of any raised decks and porches.  Clear walking paths of anything that might make someone trip, such as tools or rocks.  Have any leaves, snow, or ice cleared regularly.  Use sand or salt on walking paths during winter.  Clean up any spills in your garage right away. This includes grease or oil spills. What other actions can I take?  Wear shoes that: ? Have a low heel. Do not wear high heels. ? Have rubber bottoms. ? Are comfortable and fit you well. ? Are closed at the toe. Do not wear open-toe sandals.  Use tools that help you move around (mobility aids) if they are needed. These include: ? Canes. ? Walkers. ? Scooters. ? Crutches.  Review your medicines with your doctor. Some medicines can make you feel dizzy. This can increase your chance of falling. Ask your doctor what other things you can do to help prevent falls. Where to find more information  Centers for Disease Control and Prevention, STEADI: https://garcia.biz/  [employer] on Aging: BrainJudge.co.uk Contact a doctor if:  You are afraid of falling at home.  You feel weak, drowsy, or dizzy at home.  You fall at home. Summary  There are many simple things that you can do to make your home safe and to help prevent falls.  Ways to make your home safe include removing tripping hazards and installing grab bars in the bathroom.  Ask for help when making these changes in your home. This information is not intended to replace advice given to you by your health care provider. Make sure you discuss any questions you have with your health care provider. Document Released: 10/26/2008 Document  Revised: 08/14/2016 Document Reviewed: 08/14/2016 Elsevier Interactive Patient Education  2019 [place].   Diabetes Mellitus and Nutrition, Adult When you have diabetes (diabetes mellitus), it is very important to have healthy eating habits because your blood sugar (glucose) levels are greatly affected by what you eat and drink. Eating healthy foods in the appropriate amounts, at about the same times every day, can help you:  Control your blood glucose.  Lower your risk of heart  disease.  Improve your blood pressure.  Reach or maintain a healthy weight. Every person with diabetes is different, and each person has different needs for a meal plan. Your health care provider may recommend that you work with a diet and nutrition specialist (dietitian) to make a meal plan that is best for you. Your meal plan may vary depending on factors such as:  The calories you need.  The medicines you take.  Your weight.  Your blood glucose, blood pressure, and cholesterol levels.  Your activity level.  Other health conditions you have, such as heart or kidney disease. How do carbohydrates affect me? Carbohydrates, also called carbs, affect your blood glucose level more than any other type of food. Eating carbs naturally raises the amount of glucose in your blood. Carb counting is a method for keeping track of how many carbs you eat. Counting carbs is important to keep your blood glucose at a healthy level, especially if you use insulin or take certain oral diabetes medicines. It is important to know how many carbs you can safely have in each meal. This is different for every person. Your dietitian can help you calculate how many carbs you should have at each meal and for each snack. Foods that contain carbs include:  Bread, cereal, rice, pasta, and crackers.  Potatoes and corn.  Peas, beans, and lentils.  Milk and yogurt.  Fruit and juice.  Desserts, such as cakes, cookies, ice cream,  and candy. How does alcohol affect me? Alcohol can cause a sudden decrease in blood glucose (hypoglycemia), especially if you use insulin or take certain oral diabetes medicines. Hypoglycemia can be a life-threatening condition. Symptoms of hypoglycemia (sleepiness, dizziness, and confusion) are similar to symptoms of having too much alcohol. If your health care provider says that alcohol is safe for you, follow these guidelines:  Limit alcohol intake to no more than 1 drink per day for nonpregnant women and 2 drinks per day for men. One drink equals 12 oz of beer, 5 oz of wine, or 1 oz of hard liquor.  Do not drink on an empty stomach.  Keep yourself hydrated with water, diet soda, or unsweetened iced tea.  Keep in mind that regular soda, juice, and other mixers may contain a lot of sugar and must be counted as carbs. What are tips for following this plan?  Reading food labels  Start by checking the serving size on the "Nutrition Facts" label of packaged foods and drinks. The amount of calories, carbs, fats, and other nutrients listed on the label is based on one serving of the item. Many items contain more than one serving per package.  Check the total grams (g) of carbs in one serving. You can calculate the number of servings of carbs in one serving by dividing the total carbs by 15. For example, if a food has 30 g of total carbs, it would be equal to 2 servings of carbs.  Check the number of grams (g) of saturated and trans fats in one serving. Choose foods that have low or no amount of these fats.  Check the number of milligrams (mg) of salt (sodium) in one serving. Most people should limit total sodium intake to less than 2,300 mg per day.  Always check the nutrition information of foods labeled as "low-fat" or "nonfat". These foods may be higher in added sugar or refined carbs and should be avoided.  Talk to your dietitian to identify your daily goals for nutrients listed  on the  label. Shopping  Avoid buying canned, premade, or processed foods. These foods tend to be high in fat, sodium, and added sugar.  Shop around the outside edge of the grocery store. This includes fresh fruits and vegetables, bulk grains, fresh meats, and fresh dairy. Cooking  Use low-heat cooking methods, such as baking, instead of high-heat cooking methods like deep frying.  Cook using healthy oils, such as olive, canola, or sunflower oil.  Avoid cooking with butter, cream, or high-fat meats. Meal planning  Eat meals and snacks regularly, preferably at the same times every day. Avoid going long periods of time without eating.  Eat foods high in fiber, such as fresh fruits, vegetables, beans, and whole grains. Talk to your dietitian about how many servings of carbs you can eat at each meal.  Eat 4-6 ounces (oz) of lean protein each day, such as lean meat, chicken, fish, eggs, or tofu. One oz of lean protein is equal to: ? 1 oz of meat, chicken, or fish. ? 1 egg. ?  cup of tofu.  Eat some foods each day that contain healthy fats, such as avocado, nuts, seeds, and fish. Lifestyle  Check your blood glucose regularly.  Exercise regularly as told by your health care provider. This may include: ? 150 minutes of moderate-intensity or vigorous-intensity exercise each week. This could be brisk walking, biking, or water aerobics. ? Stretching and doing strength exercises, such as yoga or weightlifting, at least 2 times a week.  Take medicines as told by your health care provider.  Do not use any products that contain nicotine or tobacco, such as cigarettes and e-cigarettes. If you need help quitting, ask your health care provider.  Work with a Social worker or diabetes educator to identify strategies to manage stress and any emotional and social challenges. Questions to ask a health care provider  Do I need to meet with a diabetes educator?  Do I need to meet with a dietitian?  What  number can I call if I have questions?  When are the best times to check my blood glucose? Where to find more information:  American Diabetes Association: diabetes.org  Academy of Nutrition and Dietetics: www.eatright.[employer] of Diabetes and Digestive and Kidney Diseases (NIH): [employer].dk Summary  A healthy meal plan will help you control your blood glucose and maintain a healthy lifestyle.  Working with a diet and nutrition specialist (dietitian) can help you make a meal plan that is best for you.  Keep in mind that carbohydrates (carbs) and alcohol have immediate effects on your blood glucose levels. It is important to count carbs and to use alcohol carefully. This information is not intended to replace advice given to you by your health care provider. Make sure you discuss any questions you have with your health care provider. Document Released: 09/26/2004 Document Revised: 07/30/2016 Document Reviewed: 02/04/2016 Elsevier Interactive Patient Education  2019 [employer].

## 2018-03-29 NOTE — Progress Notes (Addendum)
Subjective:   CAMMY SANJURJO is a 85 y.o. female who presents for a subsequent Medicare Annual Wellness Visit.  Ms. 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  She enjoys spending time with her friends and family.  She usually goes out to lunch daily with friends and eats dinner at a friends home each evening.  She participates in [place], [place] events, and Scouting.  She is also active in her church.  She is widowed and lives alone.  She has 4 children and 7 grandchildren.  Her 3 daughter live in close proximity to her and check on her frequently.  She still drives.   Patient Care Team: Dettinger, Fransisca Kaufmann, MD as PCP - General (Family Medicine) Sherlynn Stalls, MD as Consulting Physician (Ophthalmology) Rolm Bookbinder, MD as Consulting Physician (Dermatology) Fran Lowes, MD as Consulting Physician (Nephrology)  Hospitalizations, surgeries, and ER visits in previous 12 months No hospitalizations or surgeries this past year.  She had one ER visit for a right elbow laceration that she got from a fall in 12/2017.  Review of Systems    Patient reports that her overall health is unchanged compared to last year.  Cardiac Risk Factors include: advanced age (>8mn, >>20women);diabetes mellitus;hypertension;obesity (BMI >30kg/m2);sedentary lifestyle   All other systems negative       Current Medications (verified) Outpatient Encounter Medications as of 03/29/2018  Medication Sig  . ACCU-CHEK AVIVA PLUS test strip CHECK BLOOD SUGAR UP TO 3 TIMES A DAY AS DIRECTED  . ACCU-CHEK SOFTCLIX LANCETS lancets CHECK BLOOD SUGAR UP TO 3 TIMES A DAY AS DIRECTED  . amLODipine (NORVASC) 10 MG tablet Take 1 tablet (10 mg total) by mouth daily.  .Marland Kitchenaspirin 81 MG tablet Take 81 mg by mouth daily.    .Marland Kitchenatorvastatin (LIPITOR) 20 MG tablet Take 1 tablet (20 mg total) by mouth daily at 6 PM.  . Blood Glucose Monitoring Suppl  (ACCU-CHEK AVIVA PLUS) w/Device KIT Check BS up to three times daily  . carvedilol (COREG) 25 MG tablet   . cloNIDine (CATAPRES) 0.1 MG tablet Take 1 tablet (0.1 mg total) by mouth 3 (three) times daily.  . fluticasone (FLONASE) 50 MCG/ACT nasal spray Place 1 spray into both nostrils 2 (two) times daily as needed for allergies or rhinitis.  . furosemide (LASIX) 20 MG tablet Take 1 tablet (20 mg total) by mouth daily.  .Marland KitchenLORazepam (ATIVAN) 0.5 MG tablet Take 1 tablet (0.5 mg total) by mouth 2 (two) times daily as needed for anxiety.  .Marland KitchenMELATONIN/VITAMIN B-6 EX ST 5-1 MG TABS TAKE ONE TABLET AT BEDTIME  . Multiple Vitamins-Minerals (PRESERVISION AREDS 2) CAPS Take 2 capsules by mouth 2 (two) times daily.  . Omega-3 Fatty Acids (FISH OIL) 1000 MG CAPS Take 1,000 mg by mouth 2 (two) times daily.  . TRULICITY 07.82MNF/6.2ZHSOPN INJECT 0.75 MG ONCE A WEEK  . venlafaxine XR (EFFEXOR-XR) 150 MG 24 hr capsule TAKE 1 CAPSULE ONCE DAILY WITH BREAKFAST  . Vitamin D, Ergocalciferol, (DRISDOL) 50000 units CAPS capsule TAKE 1 CAPSULE ONCE A WEEK  . Melatonin 5 MG TABS Take 1.5 tablets (7.5 mg total) by mouth at bedtime. (Patient not taking: Reported on 03/29/2018)   No facility-administered encounter medications on file as of 03/29/2018.     Allergies (verified) Sulfa antibiotics   History: Past Medical History:  Diagnosis Date  . B12 deficiency 02/08/2015  . Cancer ([facility] [id number]  breast  . Chronic kidney disease   . Diabetes mellitus   . DM (diabetes mellitus) ([place]) 12/31/2010  . HTN (hypertension) 12/31/2010  . Hypertension   . Iron deficiency 02/24/2012  . Macular degeneration 12/31/2010  . Macular degeneration, left eye   . OAB (overactive bladder)   . Obesity 12/31/2010  . Stage I left-sided Breast cancer 12/31/2010   Past Surgical History:  Procedure Laterality Date  . bilateral cataract with intraocular lens implant    . Bladder tacked-up    . BREAST SURGERY  left    . REPLACEMENT  TOTAL KNEE BILATERAL Bilateral    TKR were ar different times about 2 yeras apart.  Marland Kitchen SKIN CANCER EXCISION     squamous cell right ear and calf right leg   Family History  Problem Relation Age of Onset  . Cancer Mother   . Cancer Sister   . Heart attack Brother   . Arthritis Sister   . Anxiety disorder Brother   . Arthritis Sister    Social History   Socioeconomic History  . Marital status: Widowed    Spouse name: Not on file  . Number of children: 4  . Years of education: Not on file  . Highest education level: High school graduate  Occupational History  . 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 Financial resource strain: Not hard at all  . Food insecurity:    Worry: Never true    Inability: Never true  . Transportation needs:    Medical: No    Non-medical: No  Tobacco Use  . Smoking status: Never Smoker  . Smokeless tobacco: Never Used  Substance and Sexual Activity  . Alcohol use: No  . Drug use: No  . Sexual activity: Not on file  Lifestyle  . Physical activity:    Days per week: 0 days    Minutes per session: 0 min  . Stress: Only a little  Relationships  . Social connections:    Talks on phone: More than three times a week    Gets together: More than three times a week    Attends religious service: More than 4 times per year    Active member of club or organization: Yes    Attends meetings of clubs or organizations: More than 4 times per year    Relationship status: Widowed  Other Topics Concern  . Not on file  Social History Narrative  . Not on file     Clinical Intake:     Pain Score: 0-No pain                  Activities of Daily Living In your present state of health, do you have any difficulty performing the following activities: 03/29/2018  Hearing? Y  Comment Wears hearing aids   Vision? N  Difficulty concentrating or making decisions? N  Walking or  climbing stairs? Y  Comment Due to knee pain  Dressing or bathing? N  Doing errands, shopping? N  Preparing Food and eating ? N  Using the Toilet? N  In the past six months, have you accidently leaked urine? Y  Comment   Do you have problems with loss of bowel control? N  Managing your Medications? N  Managing your Finances? N  Housekeeping or managing your Housekeeping? N  Some recent data might be hidden     Exercise Current Exercise Habits: The patient  does not participate in regular exercise at present, Exercise limited by: orthopedic condition(s)  Diet Consumes 3 meals a day and 0 snacks a day.  The patient feels that she mostly follow a Regular diet.  Diet History  Patient states she usually has eggs and toast or bacon and sausage for breakfast, vegetable plate at a restaurant for lunch, and vegetables for supper at a friends house.  Recommend bacon or sausage no more than once per week.  Also recommended diet of mostly non-starchy vegetables, and lean proteins and whole grains in fruits in moderation.   Depression Screen PHQ 2/9 Scores 03/29/2018 03/10/2018 12/07/2017 09/04/2017 06/04/2017 03/05/2017 01/08/2017  PHQ - 2 Score 0 0 0 0 0 1 0  PHQ- 9 Score - - - - - - -     Fall Risk Fall Risk  03/29/2018 03/29/2018 12/07/2017 09/04/2017 06/04/2017  Falls in the past year? - 1 0 No No  Number falls in past yr: - 0 - - -  Injury with Fall? (No Data) 1 - - -  Comment laceration to arm - - - -  Risk Factor Category  - - - - -  Risk for fall due to : - Impaired balance/gait - - -  Follow up Education provided;Falls prevention discussed - - - -     Objective:    Today's Vitals   03/29/18 1509 03/29/18 1514  BP: (!) 186/86 (!) 175/73  Pulse: 78 76  Weight: 229 lb (103.9 kg)   Height: 5' 4" (1.626 m)   PainSc: 0-No pain    Body mass index is 39.31 kg/m.  Advanced Directives 03/29/2018 12/16/2017 10/19/2017 02/03/2017 01/08/2017 05/13/2016 02/06/2016  Does Patient Have a  Medical Advance Directive? Yes No Yes Yes Yes No Yes  Type of [employer] [place];Living will [place];Living will Healthcare Power of [place]  Does patient want to make changes to medical advance directive? No - Patient declined - No - Patient declined No - Patient declined - - No - Patient declined  Copy of [place] in Chart? No - copy requested - No - copy requested No - copy requested No - copy requested - No - copy requested  Would patient like information on creating a medical advance directive? - No - Patient declined - - - Yes (MAU/Ambulatory/Procedural Areas - Information given) -    Hearing/Vision  No vision deficits noted during visit.  Patient sees Dr. Baird Cancer retinal specialist in [place], [place] for treatment of macular degeneration.  She states she has hearing aids, but is not wearing them today.    Cognitive Function: MMSE - Mini Mental State Exam 03/29/2018 01/08/2017 05/13/2016  Orientation to time _0 Orientation to Place _1 Registration _2 Attention/ Calculation _3 Recall _4 Language- name 2 objects _5 Language- repeat _6 Language- follow 3 step command _7 Language- read & follow direction _8 Write a sentence _9 Copy design 0 1 1  Total score _10 Immunizations and Health Maintenance Immunization History  Administered Date(s) Administered  . Influenza, High Dose Seasonal PF 01/08/2017, 12/07/2017  . Influenza,inj,Quad PF,6+ Mos 02/01/2014, 12/03/2015  . Pneumococcal Conjugate-13 01/08/2017  . Pneumococcal Polysaccharide-23 03/23/2017  .  Tdap 05/13/2016  . Zoster Recombinat (Shingrix) 05/13/2016, 03/23/2017   Health Maintenance Due  Topic Date Due  . OPHTHALMOLOGY EXAM  04/02/2016   Called [facility] in [place] to schedule eye exam.  They state patient is not due until  06/16/2018.  Requested notes from visit 06/2017.  Scheduled patient for follow up at [facility] on 06/16/2018 at 10:40 am.    Health Maintenance  Topic Date Due  . OPHTHALMOLOGY EXAM  04/02/2016  . FOOT EXAM  09/05/2018  . HEMOGLOBIN A1C  09/08/2018  . DEXA SCAN  03/11/2020  . TETANUS/TDAP  05/14/2026  . PNA vac Low Risk Adult  Completed        Assessment:   This is a routine wellness examination for [place].    Plan:    Goals    . Exercise 3x per week (30 min per time)     Chair exercises are a great option.         Health Maintenance & Additional Screening Recommendations  Advanced directives: has an advanced directive - a copy HAS NOT been provided.  Lung: Low Dose CT Chest recommended if Age 84-80 years, 30 pack-year currently smoking OR have quit w/in 15years. Patient does not qualify. Hepatitis C Screening recommended: not applicable     Keep f/u with Dettinger, Fransisca Kaufmann, MD and any other specialty appointments you may have Continue current medications Move carefully to avoid falls. Use assistive devices like a cane or walker if needed. Aim for at least 150 minutes of moderate activity a week. This can be done with chair exercises if necessary. Read or work on puzzles daily Stay connected with friends and family  I have personally reviewed and noted the following in the patient's chart:   . Medical and social history . Use of alcohol, tobacco or illicit drugs  . Current medications and supplements . Functional ability and status . Nutritional status . Physical activity . Advanced directives . List of other physicians . Hospitalizations, surgeries, and ER visits in previous 12 months . Vitals . Screenings to include cognitive, depression, and falls . Referrals and appointments  In addition, I have reviewed and discussed with patient certain preventive protocols, quality metrics, and best practice recommendations. A written personalized care plan for  preventive services as well as general preventive health recommendations were provided to patient.     Nolberto Hanlon, RN  03/29/2018

## 2018-04-05 ENCOUNTER — Other Ambulatory Visit: Payer: Self-pay | Admitting: Family Medicine

## 2018-04-05 DIAGNOSIS — I10 Essential (primary) hypertension: Secondary | ICD-10-CM

## 2018-04-23 ENCOUNTER — Other Ambulatory Visit: Payer: Self-pay | Admitting: Family Medicine

## 2018-04-26 ENCOUNTER — Other Ambulatory Visit: Payer: Self-pay | Admitting: Family Medicine

## 2018-04-26 DIAGNOSIS — I10 Essential (primary) hypertension: Secondary | ICD-10-CM

## 2018-04-26 NOTE — Telephone Encounter (Signed)
Last office visit 03/10/2018

## 2018-05-13 ENCOUNTER — Telehealth: Payer: Self-pay | Admitting: Family Medicine

## 2018-05-13 ENCOUNTER — Other Ambulatory Visit: Payer: Self-pay

## 2018-05-13 ENCOUNTER — Ambulatory Visit (INDEPENDENT_AMBULATORY_CARE_PROVIDER_SITE_OTHER): Payer: Medicare Other | Admitting: Family Medicine

## 2018-05-13 ENCOUNTER — Encounter: Payer: Self-pay | Admitting: Family Medicine

## 2018-05-13 DIAGNOSIS — H60331 Swimmer's ear, right ear: Secondary | ICD-10-CM

## 2018-05-13 MED ORDER — NEOMYCIN-COLIST-HC-THONZONIUM 3.3-3-10-0.5 MG/ML OT SUSP: 3.0000 [drp] | Freq: Four times a day (QID) | OTIC | 0 refills | Status: AC

## 2018-05-13 NOTE — Progress Notes (Signed)
Virtual Visit via telephone Note  I connected with Anna Dickerson on 05/13/18 at 1010 by telephone and verified that I am speaking with the correct person using two identifiers. Anna Dickerson is currently located at home and no other people are currently with her during visit. The provider, Fransisca Kaufmann Locklyn Henriquez, MD is located in their office at time of visit.  Call ended at 1016  I discussed the limitations, risks, security and privacy concerns of performing an evaluation and management service by telephone and the availability of in person appointments. I also discussed with the patient that there may be a patient responsible charge related to this service. The patient expressed understanding and agreed to proceed.   History and Present Illness: Patient had wax come out of her ear 4 days ago and it started hurting yesterday.  She says that she started having a lot of pain yesterday and she said she pushed overall it hurt significantly.  She rates her pain as moderate to severe and it is really been bothering her.  She feels like she has a pressure in the ear.  She denies any drainage currently but the wax coming out she thinks is correlated to it.  She denies any fevers or chills or congestion or sinus pressure.  No diagnosis found.  Outpatient Encounter Medications as of 05/13/2018  Medication Sig  . ACCU-CHEK AVIVA PLUS test strip CHECK BLOOD SUGAR UP TO 3 TIMES A DAY AS DIRECTED  . ACCU-CHEK SOFTCLIX LANCETS lancets CHECK BLOOD SUGAR UP TO 3 TIMES A DAY AS DIRECTED  . amLODipine (NORVASC) 10 MG tablet Take 1 tablet (10 mg total) by mouth daily.  Marland Kitchen aspirin 81 MG tablet Take 81 mg by mouth daily.    Marland Kitchen atorvastatin (LIPITOR) 20 MG tablet Take 1 tablet (20 mg total) by mouth daily at 6 PM.  . Blood Glucose Monitoring Suppl (ACCU-CHEK AVIVA PLUS) w/Device KIT Check BS up to three times daily  . carvedilol (COREG) 25 MG tablet   . cloNIDine (CATAPRES) 0.1 MG tablet Take 1 tablet (0.1  mg total) by mouth 3 (three) times daily.  . fluticasone (FLONASE) 50 MCG/ACT nasal spray Place 1 spray into both nostrils 2 (two) times daily as needed for allergies or rhinitis.  . furosemide (LASIX) 20 MG tablet Take 1 tablet (20 mg total) by mouth daily.  Marland Kitchen LORazepam (ATIVAN) 0.5 MG tablet TAKE 1 TABLET TWICE DAILY AS NEEDED FOR ANXIETY  . Melatonin 5 MG TABS Take 1.5 tablets (7.5 mg total) by mouth at bedtime. (Patient not taking: Reported on 03/29/2018)  . MELATONIN/VITAMIN B-6 EX ST 5-1 MG TABS TAKE ONE TABLET AT BEDTIME  . Multiple Vitamins-Minerals (PRESERVISION AREDS 2) CAPS Take 2 capsules by mouth 2 (two) times daily.  . Omega-3 Fatty Acids (FISH OIL) 1000 MG CAPS Take 1,000 mg by mouth 2 (two) times daily.  . TRULICITY 7.51 WC/5.8NI SOPN INJECT 0.75 MG ONCE A WEEK  . venlafaxine XR (EFFEXOR-XR) 150 MG 24 hr capsule TAKE 1 CAPSULE ONCE DAILY WITH BREAKFAST  . Vitamin D, Ergocalciferol, (DRISDOL) 50000 units CAPS capsule TAKE 1 CAPSULE ONCE A WEEK   No facility-administered encounter medications on file as of 05/13/2018.     Review of Systems  Constitutional: Negative for chills and fever.  HENT: Positive for ear pain. Negative for congestion, ear discharge, sinus pressure, sinus pain, sneezing and sore throat.   Eyes: Negative for visual disturbance.  Respiratory: Negative for cough, chest tightness and shortness of breath.  Cardiovascular: Negative for chest pain and leg swelling.  All other systems reviewed and are negative.   Observations/Objective: Patient sounds comfortable on the phone and in no acute distress  Assessment and Plan: Problem List Items Addressed This Visit    None    Visit Diagnoses    Acute swimmer's ear of right side    -  Primary   Relevant Medications   neomycin-colistin-hydrocortisone-thonzonium (CORTISPORIN-TC) 3.03-15-08-0.5 MG/ML OTIC suspension       Follow Up Instructions: As needed    I discussed the assessment and treatment plan with  the patient. The patient was provided an opportunity to ask questions and all were answered. The patient agreed with the plan and demonstrated an understanding of the instructions.   The patient was advised to call back or seek an in-person evaluation if the symptoms worsen or if the condition fails to improve as anticipated.  The above assessment and management plan was discussed with the patient. The patient verbalized understanding of and has agreed to the management plan. Patient is aware to call the clinic if symptoms persist or worsen. Patient is aware when to return to the clinic for a follow-up visit. Patient educated on when it is appropriate to go to the emergency department.    I provided 6 minutes of non-face-to-face time during this encounter.    [name], MD

## 2018-05-26 ENCOUNTER — Other Ambulatory Visit: Payer: Self-pay | Admitting: Family Medicine

## 2018-05-26 DIAGNOSIS — I10 Essential (primary) hypertension: Secondary | ICD-10-CM

## 2018-05-26 NOTE — Telephone Encounter (Signed)
Has an appointment schedled for 06-09-18.  Refill one script or move appointment forward?

## 2018-05-26 NOTE — Telephone Encounter (Signed)
Needs appt for refill on controlled substance

## 2018-06-08 ENCOUNTER — Other Ambulatory Visit: Payer: Self-pay

## 2018-06-09 ENCOUNTER — Ambulatory Visit: Payer: Medicare Other | Admitting: Family Medicine

## 2018-06-09 ENCOUNTER — Encounter: Payer: Self-pay | Admitting: Family Medicine

## 2018-06-09 VITALS — BP 150/69 | HR 80 | Temp 97.2°F | Ht 64.0 in | Wt 225.0 lb

## 2018-06-09 DIAGNOSIS — E1159 Type 2 diabetes mellitus with other circulatory complications: Secondary | ICD-10-CM | POA: Diagnosis not present

## 2018-06-09 DIAGNOSIS — E1140 Type 2 diabetes mellitus with diabetic neuropathy, unspecified: Secondary | ICD-10-CM | POA: Diagnosis not present

## 2018-06-09 DIAGNOSIS — E1122 Type 2 diabetes mellitus with diabetic chronic kidney disease: Secondary | ICD-10-CM | POA: Diagnosis not present

## 2018-06-09 DIAGNOSIS — N183 Chronic kidney disease, stage 3 (moderate): Secondary | ICD-10-CM

## 2018-06-09 DIAGNOSIS — Z79899 Other long term (current) drug therapy: Secondary | ICD-10-CM

## 2018-06-09 DIAGNOSIS — F339 Major depressive disorder, recurrent, unspecified: Secondary | ICD-10-CM

## 2018-06-09 DIAGNOSIS — I10 Essential (primary) hypertension: Secondary | ICD-10-CM

## 2018-06-09 DIAGNOSIS — E1139 Type 2 diabetes mellitus with other diabetic ophthalmic complication: Secondary | ICD-10-CM

## 2018-06-09 LAB — BAYER DCA HB A1C WAIVED: HB A1C (BAYER DCA - WAIVED): 6.6 % (ref <7.0)

## 2018-06-09 MED ORDER — LORAZEPAM 0.5 MG PO TABS: 0.5000 mg | ORAL_TABLET | Freq: Two times a day (BID) | ORAL | 1 refills | Status: DC | PRN

## 2018-06-09 MED ORDER — FREESTYLE LIBRE 14 DAY SENSOR MISC: 1.0000 | 3 refills | Status: DC

## 2018-06-09 MED ORDER — FREESTYLE LIBRE 14 DAY READER DEVI: 1.0000 | Freq: Two times a day (BID) | 1 refills | Status: DC

## 2018-06-09 NOTE — Addendum Note (Signed)
Addended by: Caryl Pina on: 06/09/2018 02:26 PM   Modules accepted: Orders

## 2018-06-09 NOTE — Progress Notes (Addendum)
BP (!) 150/69   Pulse 80   Temp (!) 97.2 F (36.2 C) (Oral)   Ht 5\' 4"  (1.626 m)   Wt 225 lb (102.1 kg)   BMI 38.62 kg/m    Subjective:   Patient ID: Anna Dickerson, female    DOB: [birth year], 85 y.o.   MRN: [id number]  HPI: Anna Dickerson is a 85 y.o. female presenting on 06/09/2018 for Diabetes (3 month follow up)   HPI Type 2 diabetes mellitus Patient comes in today for recheck of his diabetes. Patient has been currently taking Trulicity. Patient is not currently on an ACE inhibitor/ARB. Patient has not seen an ophthalmologist this year. Patient denies any issues with their feet.  Patient has visual problems secondary to the diabetes and also has CKD and sees a nephrologist for issues with kidneys.  Hypertension Patient is currently on carvedilol and clonidine and amlodipine, and their blood pressure today is over 69 which is better than what she has been running quite some time.. Patient denies any lightheadedness or dizziness. Patient denies headaches, blurred vision, chest pains, shortness of breath, or weakness. Denies any side effects from medication and is content with current medication.   Depression and anxiety Patient is coming in for recheck of depression anxiety today.  She is currently on Effexor and lorazepam, she mainly uses about a half of lorazepam every other day to help with anxiety and sleep.  She tries not to use it every day because she does not want to get addicted to it and she feels like the Effexor is helping her sufficiently with anxiety as well.  She denies any suicidal ideations or thoughts of hurting herself.  Reviewed [facility] drug database and patient has been consistent.  Relevant past medical, surgical, family and social history reviewed and updated as indicated. Interim medical history since our last visit reviewed. Allergies and medications reviewed and updated.  Review of Systems  Constitutional: Negative for chills and fever.   Eyes: Negative for visual disturbance.  Respiratory: Negative for chest tightness and shortness of breath.   Cardiovascular: Negative for chest pain and leg swelling.  Musculoskeletal: Negative for back pain and gait problem.  Skin: Negative for rash.  Neurological: Negative for dizziness, weakness, light-headedness, numbness and headaches.  Psychiatric/Behavioral: Negative for agitation and behavioral problems.  All other systems reviewed and are negative.   Per HPI unless specifically indicated above   Allergies as of 06/09/2018      Reactions   Sulfa Antibiotics    Mouth ulcers      Medication List       Accurate as of Jun 09, 2018  2:26 PM. If you have any questions, ask your nurse or doctor.        Accu-Chek Aviva Plus test strip Generic drug:  glucose blood CHECK BLOOD SUGAR UP TO 3 TIMES A DAY AS DIRECTED   Accu-Chek Aviva Plus w/Device Kit Check BS up to three times daily   Accu-Chek Softclix Lancets lancets CHECK BLOOD SUGAR UP TO 3 TIMES A DAY AS DIRECTED   amLODipine 10 MG tablet Commonly known as:  NORVASC Take 1 tablet (10 mg total) by mouth daily.   aspirin 81 MG tablet Take 81 mg by mouth daily.   atorvastatin 20 MG tablet Commonly known as:  LIPITOR Take 1 tablet (20 mg total) by mouth daily at 6 PM.   carvedilol 25 MG tablet Commonly known as:  COREG   cloNIDine 0.1 MG tablet Commonly known  as:  CATAPRES Take 1 tablet (0.1 mg total) by mouth 3 (three) times daily.   Fish Oil 1000 MG Caps Take 1,000 mg by mouth 2 (two) times daily.   fluticasone 50 MCG/ACT nasal spray Commonly known as:  FLONASE Place 1 spray into both nostrils 2 (two) times daily as needed for allergies or rhinitis.   FreeStyle Libre 14 Day Reader Hardie Pulley 1 each by Does not apply route 2 (two) times daily. Started by:  Nils Pyle, MD   FreeStyle Libre 14 Day Sensor Misc 1 each by Does not apply route every 14 (fourteen) days. Started by:  Nils Pyle,  MD   furosemide 20 MG tablet Commonly known as:  LASIX Take 1 tablet (20 mg total) by mouth daily.   LORazepam 0.5 MG tablet Commonly known as:  ATIVAN Take 1 tablet (0.5 mg total) by mouth 2 (two) times daily as needed for anxiety. What changed:  See the new instructions. Changed by:  Elige Radon Jerimiah Wolman, MD   Melatonin 5 MG Tabs Take 1.5 tablets (7.5 mg total) by mouth at bedtime.   Melatonin/Vitamin B-6 Ex St 5-1 MG Tabs Generic drug:  Melatonin-Pyridoxine TAKE ONE TABLET AT BEDTIME   PreserVision AREDS 2 Caps Take 2 capsules by mouth 2 (two) times daily.   Trulicity 0.75 MG/0.5ML Sopn Generic drug:  Dulaglutide INJECT 0.75 MG ONCE A WEEK   venlafaxine XR 150 MG 24 hr capsule Commonly known as:  EFFEXOR-XR TAKE 1 CAPSULE ONCE DAILY WITH BREAKFAST   Vitamin D (Ergocalciferol) 1.25 MG (50000 UT) Caps capsule Commonly known as:  DRISDOL TAKE 1 CAPSULE ONCE A WEEK        Objective:   BP (!) 150/69   Pulse 80   Temp (!) 97.2 F (36.2 C) (Oral)   Ht 5\' 4"  (1.626 m)   Wt 225 lb (102.1 kg)   BMI 38.62 kg/m   Wt Readings from Last 3 Encounters:  06/09/18 225 lb (102.1 kg)  03/29/18 229 lb (103.9 kg)  03/10/18 228 lb 12.8 oz (103.8 kg)    Physical Exam Vitals signs and nursing note reviewed.  Constitutional:      General: She is not in acute distress.    Appearance: She is well-developed. She is not diaphoretic.  Eyes:     Conjunctiva/sclera: Conjunctivae normal.  Cardiovascular:     Rate and Rhythm: Normal rate and regular rhythm.     Heart sounds: Murmur (2 out of 6 systolic murmur) present.  Pulmonary:     Effort: Pulmonary effort is normal. No respiratory distress.     Breath sounds: Normal breath sounds. No wheezing.  Musculoskeletal: Normal range of motion.        General: No tenderness.  Skin:    General: Skin is warm and dry.     Findings: No rash.  Neurological:     Mental Status: She is alert and oriented to person, place, and time.      Coordination: Coordination normal.  Psychiatric:        Behavior: Behavior normal.     Results for orders placed or performed in visit on 04/29/18  HM DIABETES EYE EXAM  Result Value Ref Range   HM Diabetic Eye Exam No Retinopathy No Retinopathy    Assessment & Plan:   Problem List Items Addressed This Visit      Cardiovascular and Mediastinum   Hypertension associated with diabetes (HCC)   Relevant Orders   BMP8+EGFR     Endocrine  DM (diabetes mellitus) (HCC)   Relevant Orders   Bayer DCA Hb A1c Waived   BMP8+EGFR   Bayer DCA Hb A1c Waived   CKD stage 3 due to type 2 diabetes mellitus (HCC)   Relevant Orders   BMP8+EGFR   Type 2 diabetes mellitus with diabetic neuropathy, without long-term current use of insulin (HCC) - Primary   Relevant Orders   Bayer DCA Hb A1c Waived     Other   Depression, recurrent (HCC)   Relevant Medications   LORazepam (ATIVAN) 0.5 MG tablet   Other Relevant Orders   ToxASSURE Select 13 (MW), Urine    Other Visit Diagnoses    Controlled substance agreement signed           Controlled substance contract signed today  Continue current medication will check A1c today to see how she is doing but she thinks the blood sugars are running a lot better. Follow up plan: Return in about 3 months (around 09/09/2018), or if symptoms worsen or fail to improve, for Diabetes and hypertension and CKD and depression.  Counseling provided for all of the vaccine components Orders Placed This Encounter  Procedures  . ToxASSURE Select 13 (MW), Urine  . BMP8+EGFR  . [facility] Hb A1c Waived    Arville Care, MD [facility] 06/09/2018, 2:26 PM

## 2018-06-10 LAB — BMP8+EGFR
BUN/Creatinine Ratio: 16 (ref 12–28)
BUN: 33 mg/dL — ABNORMAL HIGH (ref 8–27)
CO2: 23 mmol/L (ref 20–29)
Calcium: 9.4 mg/dL (ref 8.7–10.3)
Chloride: 99 mmol/L (ref 96–106)
Creatinine, Ser: 2.08 mg/dL — ABNORMAL HIGH (ref 0.57–1.00)
GFR calc Af Amer: 24 mL/min/{1.73_m2} — ABNORMAL LOW (ref >59)
GFR calc non Af Amer: 21 mL/min/{1.73_m2} — ABNORMAL LOW (ref >59)
Glucose: 210 mg/dL — ABNORMAL HIGH (ref 65–99)
Potassium: 3.7 mmol/L (ref 3.5–5.2)
Sodium: 144 mmol/L (ref 134–144)

## 2018-06-15 LAB — TOXASSURE SELECT 13 (MW), URINE

## 2018-06-30 ENCOUNTER — Other Ambulatory Visit: Payer: Self-pay | Admitting: Family Medicine

## 2018-06-30 DIAGNOSIS — I10 Essential (primary) hypertension: Secondary | ICD-10-CM

## 2018-07-31 ENCOUNTER — Other Ambulatory Visit: Payer: Self-pay | Admitting: Family Medicine

## 2018-07-31 DIAGNOSIS — F339 Major depressive disorder, recurrent, unspecified: Secondary | ICD-10-CM

## 2018-08-02 ENCOUNTER — Other Ambulatory Visit: Payer: Self-pay | Admitting: Family Medicine

## 2018-08-02 DIAGNOSIS — I10 Essential (primary) hypertension: Secondary | ICD-10-CM

## 2018-08-02 NOTE — Telephone Encounter (Signed)
Pt seen 5/20. Please address refill

## 2018-08-26 ENCOUNTER — Telehealth: Payer: Self-pay

## 2018-08-26 ENCOUNTER — Encounter: Payer: Self-pay | Admitting: Family Medicine

## 2018-08-26 ENCOUNTER — Ambulatory Visit (INDEPENDENT_AMBULATORY_CARE_PROVIDER_SITE_OTHER): Payer: Medicare Other | Admitting: Family Medicine

## 2018-08-26 ENCOUNTER — Other Ambulatory Visit: Payer: Self-pay | Admitting: Family Medicine

## 2018-08-26 DIAGNOSIS — I10 Essential (primary) hypertension: Secondary | ICD-10-CM

## 2018-08-26 DIAGNOSIS — E1140 Type 2 diabetes mellitus with diabetic neuropathy, unspecified: Secondary | ICD-10-CM | POA: Diagnosis not present

## 2018-08-26 MED ORDER — FREESTYLE LIBRE 14 DAY READER DEVI: 1.0000 | Freq: Two times a day (BID) | 1 refills | Status: DC

## 2018-08-26 MED ORDER — FREESTYLE LIBRE 14 DAY SENSOR MISC: 1.0000 | 3 refills | Status: DC

## 2018-08-26 NOTE — Progress Notes (Signed)
Virtual Visit via telephone Note  I connected with Anna Dickerson on 08/26/18 at 1531 by telephone and verified that I am speaking with the correct person using two identifiers. Anna Dickerson is currently located at home, spoke with her daughter who is at work and Daughter are currently with her during visit. The provider, Fransisca Kaufmann Gladyse Corvin, MD is located in their office at time of visit.  Call ended at 1538  I discussed the limitations, risks, security and privacy concerns of performing an evaluation and management service by telephone and the availability of in person appointments. I also discussed with the patient that there may be a patient responsible charge related to this service. The patient expressed understanding and agreed to proceed.   History and Present Illness: Patient is calling in to get blood glucose monitoring machine, she is taking it 4 times a day and is keeping a record of it and wants to see if she can get the blood sugar monitoring system because of her issues with dexterity in her hands as well.  Her blood sugars have been running well but she has been having difficulties to some extent keeping a track of it because of her dexterity    Outpatient Encounter Medications as of 08/26/2018  Medication Sig  . ACCU-CHEK AVIVA PLUS test strip CHECK BLOOD SUGAR UP TO 3 TIMES A DAY AS DIRECTED  . ACCU-CHEK SOFTCLIX LANCETS lancets CHECK BLOOD SUGAR UP TO 3 TIMES A DAY AS DIRECTED  . amLODipine (NORVASC) 10 MG tablet Take 1 tablet (10 mg total) by mouth daily.  Marland Kitchen aspirin 81 MG tablet Take 81 mg by mouth daily.    Marland Kitchen atorvastatin (LIPITOR) 20 MG tablet Take 1 tablet (20 mg total) by mouth daily at 6 PM.  . Blood Glucose Monitoring Suppl (ACCU-CHEK AVIVA PLUS) w/Device KIT Check BS up to three times daily  . carvedilol (COREG) 25 MG tablet   . cloNIDine (CATAPRES) 0.1 MG tablet Take 1 tablet (0.1 mg total) by mouth 3 (three) times daily.  . Continuous Blood Gluc  Receiver (FREESTYLE LIBRE 14 DAY READER) DEVI 1 each by Does not apply route 2 (two) times daily.  . Continuous Blood Gluc Sensor (FREESTYLE LIBRE 14 DAY SENSOR) MISC 1 each by Does not apply route every 14 (fourteen) days.  . fluticasone (FLONASE) 50 MCG/ACT nasal spray Place 1 spray into both nostrils 2 (two) times daily as needed for allergies or rhinitis.  . furosemide (LASIX) 20 MG tablet Take 1 tablet (20 mg total) by mouth daily.  Marland Kitchen LORazepam (ATIVAN) 0.5 MG tablet TAKE 1 TABLET TWICE DAILY AS NEEDED FOR ANXIETY  . Melatonin 5 MG TABS Take 1.5 tablets (7.5 mg total) by mouth at bedtime.  Marland Kitchen MELATONIN/VITAMIN B-6 EX ST 5-1 MG TABS TAKE ONE TABLET AT BEDTIME  . Multiple Vitamins-Minerals (PRESERVISION AREDS 2) CAPS Take 2 capsules by mouth 2 (two) times daily.  . Omega-3 Fatty Acids (FISH OIL) 1000 MG CAPS Take 1,000 mg by mouth 2 (two) times daily.  . TRULICITY 1.44 RX/5.4MG SOPN INJECT 0.75 MG ONCE A WEEK  . venlafaxine XR (EFFEXOR-XR) 150 MG 24 hr capsule TAKE 1 CAPSULE ONCE DAILY WITH BREAKFAST  . Vitamin D, Ergocalciferol, (DRISDOL) 50000 units CAPS capsule TAKE 1 CAPSULE ONCE A WEEK   No facility-administered encounter medications on file as of 08/26/2018.     Review of Systems  Constitutional: Negative for chills and fever.  Eyes: Negative for visual disturbance.  Respiratory: Negative for chest  tightness and shortness of breath.   Cardiovascular: Negative for chest pain and leg swelling.  Musculoskeletal: Negative for back pain and gait problem.  Skin: Negative for rash.  Neurological: Negative for light-headedness and headaches.  Psychiatric/Behavioral: Negative for agitation and behavioral problems.  All other systems reviewed and are negative.   Observations/Objective: Patient sounds comfortable and in no acute distress  Assessment and Plan: Problem List Items Addressed This Visit      Endocrine   Type 2 diabetes mellitus with diabetic neuropathy, without long-term  current use of insulin ([place]) - Primary   Relevant Medications   Continuous Blood Gluc Receiver (FREESTYLE LIBRE 14 DAY READER) DEVI   Continuous Blood Gluc Sensor (FREESTYLE LIBRE 14 DAY SENSOR) MISC       Follow Up Instructions:  Patient checks her blood sugar now 4 times a day and is recording it and keeping a record of it.  already has an appointment for her usual follow-up later this month.   I discussed the assessment and treatment plan with the patient. The patient was provided an opportunity to ask questions and all were answered. The patient agreed with the plan and demonstrated an understanding of the instructions.   The patient was advised to call back or seek an in-person evaluation if the symptoms worsen or if the condition fails to improve as anticipated.  The above assessment and management plan was discussed with the patient. The patient verbalized understanding of and has agreed to the management plan. Patient is aware to call the clinic if symptoms persist or worsen. Patient is aware when to return to the clinic for a follow-up visit. Patient educated on when it is appropriate to go to the emergency department.    I provided 7 minutes of non-face-to-face time during this encounter.    [name], MD

## 2018-08-26 NOTE — Telephone Encounter (Signed)
Mendel Ryder called who is patient's case manager with her kidney program.  States she has been trying to get patient a continued glucose machine. Has tried [employer] but they would not cover do to patients insurance. In the providers note it needs to say that patient is checking BS 4 times a day and is recording it.  Tele visit scheduled with Dr. Keturah Barre today. Mendel Ryder will check with patient next week to see the status of the machine.   Tried to contact patient to let her know about the scheduled appointment to make sure it will work for patient.  Was unable to get patient but I left a detailed message.

## 2018-08-30 ENCOUNTER — Other Ambulatory Visit: Payer: Self-pay | Admitting: Family Medicine

## 2018-08-30 DIAGNOSIS — F339 Major depressive disorder, recurrent, unspecified: Secondary | ICD-10-CM

## 2018-09-10 ENCOUNTER — Ambulatory Visit: Payer: Medicare Other | Admitting: Family Medicine

## 2018-09-10 NOTE — Progress Notes (Signed)
Patient was not home, spoke with family member and said that she will try and call back later.  She forgot.

## 2018-09-13 ENCOUNTER — Other Ambulatory Visit: Payer: Self-pay

## 2018-09-14 ENCOUNTER — Encounter: Payer: Self-pay | Admitting: Family

## 2018-09-14 ENCOUNTER — Ambulatory Visit (INDEPENDENT_AMBULATORY_CARE_PROVIDER_SITE_OTHER): Payer: Medicare Other | Admitting: Family

## 2018-09-14 VITALS — BP 173/82 | HR 75 | Temp 97.1°F | Ht 64.0 in | Wt 226.0 lb

## 2018-09-14 DIAGNOSIS — Z4802 Encounter for removal of sutures: Secondary | ICD-10-CM

## 2018-09-14 NOTE — Patient Instructions (Signed)
Suture Removal, Care After This sheet gives you information about how to care for yourself after your procedure. Your health care provider may also give you more specific instructions. If you have problems or questions, contact your health care provider. What can I expect after the procedure? After your stitches (sutures) are removed, it is common to have:  Some discomfort and swelling in the area.  Slight redness in the area. Follow these instructions at home: If you have a bandage:  Wash your hands with soap and water before you change your bandage (dressing). If soap and water are not available, use hand sanitizer.  Change your dressing as told by your health care provider. If your dressing becomes wet or dirty, or develops a bad smell, change it as soon as possible.  If your dressing sticks to your skin, soak it in warm water to loosen it. Wound care   Check your wound every day for signs of infection. Check for: ? More redness, swelling, or pain. ? Fluid or blood. ? Warmth. ? Pus or a bad smell.  Wash your hands with soap and water before and after touching your wound.  Apply cream or ointment only as directed by your health care provider. If you are using cream or ointment, wash the area with soap and water 2 times a day to remove all the cream or ointment. Rinse off the soap and pat the area dry with a clean towel.  If you have skin glue or adhesive strips on your wound, leave these closures in place. They may need to stay in place for 2 weeks or longer. If adhesive strip edges start to loosen and curl up, you may trim the loose edges. Do not remove adhesive strips completely unless your health care provider tells you to do that.  Keep the wound area dry and clean. Do not take baths, swim, or use a hot tub until your health care provider approves.  Continue to protect the wound from injury.  Do not pick at your wound. Picking can cause an infection.  When your wound has  completely healed, wear sunscreen over it or cover it with clothing when you are outside. New scars get sunburned easily, which can make scarring worse. General instructions  Take over-the-counter and prescription medicines only as told by your health care provider.  Keep all follow-up visits as told by your health care provider. This is important. Contact a health care provider if:  You have redness, swelling, or pain around your wound.  You have fluid or blood coming from your wound.  Your wound feels warm to the touch.  You have pus or a bad smell coming from your wound.  Your wound opens up. Get help right away if:  You have a fever.  You have redness that is spreading from your wound. Summary  After your sutures are removed, it is common to have some discomfort and swelling in the area.  Wash your hands with soap and water before you change your bandage (dressing).  Keep the wound area dry and clean. Do not take baths, swim, or use a hot tub until your health care provider approves. This information is not intended to replace advice given to you by your health care provider. Make sure you discuss any questions you have with your health care provider. Document Released: 09/24/2000 Document Revised: 12/12/2016 Document Reviewed: 02/05/2016 Elsevier Patient Education  2020 Elsevier Inc.  

## 2018-09-14 NOTE — Progress Notes (Signed)
   Subjective:    Patient ID: Anna Dickerson, female    DOB: 1929/05/20, 85 y.o.   MRN: [id number]  Chief Complaint  Patient presents with  . Wound Check    remove sutures    HPI Pt presents to the office today to remove 8 sutures on the top of her head. She states she had a precancerous spot removed two weeks ago. She was told to "leave the middle suture in". She denies any bleeding or pain at this time.    Review of Systems  All other systems reviewed and are negative.      Objective:   Physical Exam Vitals signs reviewed.  Constitutional:      General: She is not in acute distress.    Appearance: She is well-developed.  HENT:     Head: Normocephalic and atraumatic.  Neck:     Musculoskeletal: Normal range of motion and neck supple.     Thyroid: No thyromegaly.  Cardiovascular:     Rate and Rhythm: Normal rate and regular rhythm.     Heart sounds: Normal heart sounds. No murmur.  Pulmonary:     Effort: Pulmonary effort is normal. No respiratory distress.     Breath sounds: Normal breath sounds. No wheezing.  Abdominal:     General: Bowel sounds are normal. There is no distension.     Palpations: Abdomen is soft.     Tenderness: There is no abdominal tenderness.  Musculoskeletal: Normal range of motion.        General: No tenderness.  Skin:    General: Skin is warm and dry.          Comments: Well approximated wound, healed and scabbed. No discharge, redness, tenderness present   Neurological:     Mental Status: She is alert and oriented to person, place, and time.     Cranial Nerves: No cranial nerve deficit.     Deep Tendon Reflexes: Reflexes are normal and symmetric.  Psychiatric:        Behavior: Behavior normal.        Thought Content: Thought content normal.        Judgment: Judgment normal.       BP (!) 173/82   Pulse 75   Temp (!) 97.1 F (36.2 C) (Oral)   Ht 5\' 4"  (1.626 m)   Wt 226 lb (102.5 kg)   BMI 38.79 kg/m      Assessment &  Plan:  1. Visit for suture removal Keep clean and dry Keep follow up with derm Keep follow up with PCP Let us know if you have any s/s of infection  Evelina Dun, FNP

## 2018-09-17 ENCOUNTER — Encounter: Payer: Self-pay | Admitting: Family Medicine

## 2018-09-17 ENCOUNTER — Other Ambulatory Visit: Payer: Self-pay

## 2018-09-17 ENCOUNTER — Ambulatory Visit (INDEPENDENT_AMBULATORY_CARE_PROVIDER_SITE_OTHER): Payer: Medicare Other | Admitting: Family Medicine

## 2018-09-17 DIAGNOSIS — F339 Major depressive disorder, recurrent, unspecified: Secondary | ICD-10-CM

## 2018-09-17 MED ORDER — LORAZEPAM 0.5 MG PO TABS: 0.5000 mg | ORAL_TABLET | Freq: Every day | ORAL | 0 refills | Status: DC

## 2018-09-17 NOTE — Progress Notes (Signed)
Virtual Visit via telephone Note  I connected with Anna Dickerson on 09/17/18 at [phone] by telephone and verified that I am speaking with the correct person using two identifiers. Anna Dickerson is currently located at home and no other people are currently with her during visit. The provider, Fransisca Kaufmann , MD is located in their office at time of visit.  Call ended at [phone]  I discussed the limitations, risks, security and privacy concerns of performing an evaluation and management service by telephone and the availability of in person appointments. I also discussed with the patient that there may be a patient responsible charge related to this service. The patient expressed understanding and agreed to proceed.   History and Present Illness: Depression and anxiety Patient says her anxiety and depression. She takes lorazepam at night sometimes for sleep when she can't sleep.  She denies any major depression or mood swings.   No diagnosis found.  Outpatient Encounter Medications as of 09/17/2018  Medication Sig  . ACCU-CHEK AVIVA PLUS test strip CHECK BLOOD SUGAR UP TO 3 TIMES A DAY AS DIRECTED  . ACCU-CHEK SOFTCLIX LANCETS lancets CHECK BLOOD SUGAR UP TO 3 TIMES A DAY AS DIRECTED  . amLODipine (NORVASC) 10 MG tablet Take 1 tablet (10 mg total) by mouth daily.  Marland Kitchen aspirin 81 MG tablet Take 81 mg by mouth daily.    Marland Kitchen atorvastatin (LIPITOR) 20 MG tablet Take 1 tablet (20 mg total) by mouth daily at 6 PM.  . Blood Glucose Monitoring Suppl (ACCU-CHEK AVIVA PLUS) w/Device KIT Check BS up to three times daily  . carvedilol (COREG) 25 MG tablet   . cloNIDine (CATAPRES) 0.1 MG tablet Take 1 tablet (0.1 mg total) by mouth 3 (three) times daily.  . Continuous Blood Gluc Receiver (FREESTYLE LIBRE 14 DAY READER) DEVI 1 each by Does not apply route 2 (two) times daily.  . Continuous Blood Gluc Sensor (FREESTYLE LIBRE 14 DAY SENSOR) MISC 1 each by Does not apply route every 14 (fourteen) days.   . fluticasone (FLONASE) 50 MCG/ACT nasal spray Place 1 spray into both nostrils 2 (two) times daily as needed for allergies or rhinitis.  . furosemide (LASIX) 20 MG tablet Take 1 tablet (20 mg total) by mouth daily.  Marland Kitchen LORazepam (ATIVAN) 0.5 MG tablet TAKE 1 TABLET TWICE DAILY AS NEEDED FOR ANXIETY  . Melatonin 5 MG TABS Take 1.5 tablets (7.5 mg total) by mouth at bedtime.  Marland Kitchen MELATONIN/VITAMIN B-6 EX ST 5-1 MG TABS TAKE ONE TABLET AT BEDTIME  . Multiple Vitamins-Minerals (PRESERVISION AREDS 2) CAPS Take 2 capsules by mouth 2 (two) times daily.  . Omega-3 Fatty Acids (FISH OIL) 1000 MG CAPS Take 1,000 mg by mouth 2 (two) times daily.  . TRULICITY 1.77 LT/9.0ZE SOPN INJECT 0.75 MG ONCE A WEEK  . venlafaxine XR (EFFEXOR-XR) 150 MG 24 hr capsule TAKE 1 CAPSULE ONCE DAILY WITH BREAKFAST  . Vitamin D, Ergocalciferol, (DRISDOL) 50000 units CAPS capsule TAKE 1 CAPSULE ONCE A WEEK   No facility-administered encounter medications on file as of 09/17/2018.     Review of Systems  Constitutional: Negative for chills and fever.  Eyes: Negative for visual disturbance.  Respiratory: Negative for chest tightness and shortness of breath.   Cardiovascular: Negative for chest pain and leg swelling.  Musculoskeletal: Negative for back pain and gait problem.  Skin: Negative for rash.  Neurological: Negative for light-headedness and headaches.  Psychiatric/Behavioral: Positive for sleep disturbance. Negative for agitation, behavioral problems, dysphoric  mood, self-injury and suicidal ideas. The patient is nervous/anxious.   All other systems reviewed and are negative.   Observations/Objective: Patient sounds comfortable and in no acute distress  Assessment and Plan: Problem List Items Addressed This Visit      Other   Depression, recurrent ([place]) - Primary   Relevant Medications   LORazepam (ATIVAN) 0.5 MG tablet       Follow Up Instructions:  Patient is to follow-up in 4 weeks for her usual  diabetic recheck  Continue lorazepam, will see back in 1 month, continue Effexor  Patient is only using lorazepam at nighttime sometimes so we will reduce the dosing.  [place] drug database reviewed   I discussed the assessment and treatment plan with the patient. The patient was provided an opportunity to ask questions and all were answered. The patient agreed with the plan and demonstrated an understanding of the instructions.   The patient was advised to call back or seek an in-person evaluation if the symptoms worsen or if the condition fails to improve as anticipated.  The above assessment and management plan was discussed with the patient. The patient verbalized understanding of and has agreed to the management plan. Patient is aware to call the clinic if symptoms persist or worsen. Patient is aware when to return to the clinic for a follow-up visit. Patient educated on when it is appropriate to go to the emergency department.    I provided 10 minutes of non-face-to-face time during this encounter.    [name], MD

## 2018-10-01 ENCOUNTER — Other Ambulatory Visit: Payer: Self-pay | Admitting: Family Medicine

## 2018-10-12 ENCOUNTER — Encounter: Payer: Self-pay | Admitting: Family Medicine

## 2018-10-13 ENCOUNTER — Other Ambulatory Visit (HOSPITAL_COMMUNITY): Payer: Self-pay

## 2018-10-13 ENCOUNTER — Ambulatory Visit: Payer: Medicare Other | Admitting: Family Medicine

## 2018-10-13 DIAGNOSIS — C50912 Malignant neoplasm of unspecified site of left female breast: Secondary | ICD-10-CM

## 2018-10-14 ENCOUNTER — Inpatient Hospital Stay (HOSPITAL_COMMUNITY): Payer: Medicare Other | Attending: Hematology

## 2018-10-14 ENCOUNTER — Other Ambulatory Visit: Payer: Self-pay

## 2018-10-14 ENCOUNTER — Ambulatory Visit (HOSPITAL_COMMUNITY)
Admission: RE | Admit: 2018-10-14 | Discharge: 2018-10-14 | Disposition: A | Discharge status: Home / Self Care | Payer: Medicare Other | Source: Ambulatory Visit | Attending: Internal Medicine | Admitting: Internal Medicine

## 2018-10-14 DIAGNOSIS — Z79899 Other long term (current) drug therapy: Secondary | ICD-10-CM | POA: Insufficient documentation

## 2018-10-14 DIAGNOSIS — I129 Hypertensive chronic kidney disease with stage 1 through stage 4 chronic kidney disease, or unspecified chronic kidney disease: Secondary | ICD-10-CM | POA: Diagnosis not present

## 2018-10-14 DIAGNOSIS — Z1231 Encounter for screening mammogram for malignant neoplasm of breast: Secondary | ICD-10-CM | POA: Diagnosis not present

## 2018-10-14 DIAGNOSIS — K5900 Constipation, unspecified: Secondary | ICD-10-CM | POA: Insufficient documentation

## 2018-10-14 DIAGNOSIS — N189 Chronic kidney disease, unspecified: Secondary | ICD-10-CM | POA: Insufficient documentation

## 2018-10-14 DIAGNOSIS — Z9223 Personal history of estrogen therapy: Secondary | ICD-10-CM | POA: Diagnosis not present

## 2018-10-14 DIAGNOSIS — Z853 Personal history of malignant neoplasm of breast: Secondary | ICD-10-CM | POA: Diagnosis not present

## 2018-10-14 DIAGNOSIS — C50912 Malignant neoplasm of unspecified site of left female breast: Secondary | ICD-10-CM

## 2018-10-14 DIAGNOSIS — E119 Type 2 diabetes mellitus without complications: Secondary | ICD-10-CM | POA: Insufficient documentation

## 2018-10-14 DIAGNOSIS — Z170 Estrogen receptor positive status [ER+]: Secondary | ICD-10-CM | POA: Insufficient documentation

## 2018-10-14 LAB — CBC WITH DIFFERENTIAL/PLATELET
Abs Immature Granulocytes: 0.05 10*3/uL (ref 0.00–0.07)
Basophils Absolute: 0.1 10*3/uL (ref 0.0–0.1)
Basophils Relative: 1 %
Eosinophils Absolute: 0.4 10*3/uL (ref 0.0–0.5)
Eosinophils Relative: 4 %
HCT: 34.5 % — ABNORMAL LOW (ref 36.0–46.0)
Hemoglobin: 10.8 g/dL — ABNORMAL LOW (ref 12.0–15.0)
Immature Granulocytes: 1 %
Lymphocytes Relative: 21 %
Lymphs Abs: 2.3 10*3/uL (ref 0.7–4.0)
MCH: 28.3 pg (ref 26.0–34.0)
MCHC: 31.3 g/dL (ref 30.0–36.0)
MCV: 90.3 fL (ref 80.0–100.0)
Monocytes Absolute: 0.7 10*3/uL (ref 0.1–1.0)
Monocytes Relative: 6 %
Neutro Abs: 7.4 10*3/uL (ref 1.7–7.7)
Neutrophils Relative %: 67 %
Platelets: 190 10*3/uL (ref 150–400)
RBC: 3.82 MIL/uL — ABNORMAL LOW (ref 3.87–5.11)
RDW: 14.6 % (ref 11.5–15.5)
WBC: 10.9 10*3/uL — ABNORMAL HIGH (ref 4.0–10.5)
nRBC: 0 % (ref 0.0–0.2)

## 2018-10-14 LAB — COMPREHENSIVE METABOLIC PANEL
ALT: 14 U/L (ref 0–44)
AST: 15 U/L (ref 15–41)
Albumin: 3.6 g/dL (ref 3.5–5.0)
Alkaline Phosphatase: 71 U/L (ref 38–126)
Anion gap: 13 (ref 5–15)
BUN: 37 mg/dL — ABNORMAL HIGH (ref 8–23)
CO2: 26 mmol/L (ref 22–32)
Calcium: 9.2 mg/dL (ref 8.9–10.3)
Chloride: 102 mmol/L (ref 98–111)
Creatinine, Ser: 2.26 mg/dL — ABNORMAL HIGH (ref 0.44–1.00)
GFR calc Af Amer: 22 mL/min — ABNORMAL LOW (ref >60)
GFR calc non Af Amer: 19 mL/min — ABNORMAL LOW (ref >60)
Glucose, Bld: 213 mg/dL — ABNORMAL HIGH (ref 70–99)
Potassium: 3.4 mmol/L — ABNORMAL LOW (ref 3.5–5.1)
Sodium: 141 mmol/L (ref 135–145)
Total Bilirubin: 0.5 mg/dL (ref 0.3–1.2)
Total Protein: 7.4 g/dL (ref 6.5–8.1)

## 2018-10-14 LAB — LACTATE DEHYDROGENASE: LDH: 151 U/L (ref 98–192)

## 2018-10-15 ENCOUNTER — Ambulatory Visit (INDEPENDENT_AMBULATORY_CARE_PROVIDER_SITE_OTHER): Payer: Medicare Other | Admitting: Family Medicine

## 2018-10-15 ENCOUNTER — Encounter: Payer: Self-pay | Admitting: Family Medicine

## 2018-10-15 DIAGNOSIS — E1159 Type 2 diabetes mellitus with other circulatory complications: Secondary | ICD-10-CM | POA: Diagnosis not present

## 2018-10-15 DIAGNOSIS — E1139 Type 2 diabetes mellitus with other diabetic ophthalmic complication: Secondary | ICD-10-CM

## 2018-10-15 DIAGNOSIS — I10 Essential (primary) hypertension: Secondary | ICD-10-CM

## 2018-10-15 DIAGNOSIS — N183 Chronic kidney disease, stage 3 unspecified: Secondary | ICD-10-CM

## 2018-10-15 DIAGNOSIS — E1122 Type 2 diabetes mellitus with diabetic chronic kidney disease: Secondary | ICD-10-CM | POA: Diagnosis not present

## 2018-10-15 MED ORDER — METOPROLOL SUCCINATE ER 100 MG PO TB24: 100.0000 mg | ORAL_TABLET | Freq: Every day | ORAL | 3 refills | Status: DC

## 2018-10-15 NOTE — Progress Notes (Signed)
Virtual Visit via telephone Note  I connected with [name] on 10/15/18 at 1350 by telephone and verified that I am speaking with the correct person using two identifiers. Anna Dickerson is currently located at home and no other people are currently with her during visit. The provider, Fransisca Kaufmann Amitai Delaughter, MD is located in their office at time of visit.  Call ended at 1408  I discussed the limitations, risks, security and privacy concerns of performing an evaluation and management service by telephone and the availability of in person appointments. I also discussed with the patient that there may be a patient responsible charge related to this service. The patient expressed understanding and agreed to proceed.  History and Present Illness: Hypertension Patient is currently on carvedilol and clonidine, and their blood pressure today is unknown, she hasn't taken it recently Patient denies any lightheadedness or dizziness. Patient denies headaches, blurred vision, chest pains, shortness of breath, or weakness. Denies any side effects from medication and is content with current medication.   Type 2 diabetes mellitus Patient comes in today for recheck of his diabetes. Patient has been currently taking no medication and is diet controlled. Patient is not currently on an ACE inhibitor/ARB. Patient has not seen an ophthalmologist this year. Patient denies any issues with their feet.    No diagnosis found.  Outpatient Encounter Medications as of 10/15/2018  Medication Sig  . ACCU-CHEK AVIVA PLUS test strip CHECK BLOOD SUGAR UP TO 3 TIMES A DAY AS DIRECTED  . ACCU-CHEK SOFTCLIX LANCETS lancets CHECK BLOOD SUGAR UP TO 3 TIMES A DAY AS DIRECTED  . amLODipine (NORVASC) 10 MG tablet Take 1 tablet (10 mg total) by mouth daily.  Marland Kitchen aspirin 81 MG tablet Take 81 mg by mouth daily.    Marland Kitchen atorvastatin (LIPITOR) 20 MG tablet Take 1 tablet (20 mg total) by mouth daily at 6 PM.  . Blood Glucose  Monitoring Suppl (ACCU-CHEK AVIVA PLUS) w/Device KIT Check BS up to three times daily  . carvedilol (COREG) 25 MG tablet   . cloNIDine (CATAPRES) 0.1 MG tablet Take 1 tablet (0.1 mg total) by mouth 3 (three) times daily.  . Continuous Blood Gluc Receiver (FREESTYLE LIBRE 14 DAY READER) DEVI 1 each by Does not apply route 2 (two) times daily.  . Continuous Blood Gluc Sensor (FREESTYLE LIBRE 14 DAY SENSOR) MISC 1 each by Does not apply route every 14 (fourteen) days.  . fluticasone (FLONASE) 50 MCG/ACT nasal spray Place 1 spray into both nostrils 2 (two) times daily as needed for allergies or rhinitis.  . furosemide (LASIX) 20 MG tablet Take 1 tablet (20 mg total) by mouth daily.  Marland Kitchen LORazepam (ATIVAN) 0.5 MG tablet Take 1 tablet (0.5 mg total) by mouth at bedtime.  . Melatonin 5 MG TABS Take 1.5 tablets (7.5 mg total) by mouth at bedtime.  Marland Kitchen MELATONIN/VITAMIN B-6 EX ST 5-1 MG TABS TAKE ONE TABLET AT BEDTIME  . Multiple Vitamins-Minerals (PRESERVISION AREDS 2) CAPS Take 2 capsules by mouth 2 (two) times daily.  . Omega-3 Fatty Acids (FISH OIL) 1000 MG CAPS Take 1,000 mg by mouth 2 (two) times daily.  . TRULICITY 7.82 NF/6.2ZH SOPN INJECT 0.75 MG ONCE A WEEK  . venlafaxine XR (EFFEXOR-XR) 150 MG 24 hr capsule TAKE 1 CAPSULE ONCE DAILY WITH BREAKFAST  . Vitamin D, Ergocalciferol, (DRISDOL) 50000 units CAPS capsule TAKE 1 CAPSULE ONCE A WEEK   No facility-administered encounter medications on file as of 10/15/2018.  Review of Systems  Constitutional: Negative for chills and fever.  Eyes: Negative for visual disturbance.  Respiratory: Negative for chest tightness and shortness of breath.   Cardiovascular: Negative for chest pain and leg swelling.  Genitourinary: Negative for difficulty urinating and dysuria.  Musculoskeletal: Negative for back pain and gait problem.  Skin: Negative for rash.  Neurological: Negative for dizziness, weakness, light-headedness, numbness and headaches.   Psychiatric/Behavioral: Negative for agitation and behavioral problems.  All other systems reviewed and are negative.   Observations/Objective: Patient sounds comfortable and in no acute distress  Assessment and Plan: Problem List Items Addressed This Visit      Cardiovascular and Mediastinum   Hypertension associated with diabetes ([place])   Relevant Medications   metoprolol succinate (TOPROL-XL) 100 MG 24 hr tablet     Endocrine   DM (diabetes mellitus) ([place]) - Primary   Relevant Orders   Bayer DCA Hb A1c Waived   CKD stage 3 due to type 2 diabetes mellitus ([place])       Follow Up Instructions: Follow-up in 6 weeks for blood pressure  Patient says she feels great although her blood pressure in the kidneys are still not improving.  She does have some blood work coming this coming Tuesday will add on A1c.    I discussed the assessment and treatment plan with the patient. The patient was provided an opportunity to ask questions and all were answered. The patient agreed with the plan and demonstrated an understanding of the instructions.   The patient was advised to call back or seek an in-person evaluation if the symptoms worsen or if the condition fails to improve as anticipated.  The above assessment and management plan was discussed with the patient. The patient verbalized understanding of and has agreed to the management plan. Patient is aware to call the clinic if symptoms persist or worsen. Patient is aware when to return to the clinic for a follow-up visit. Patient educated on when it is appropriate to go to the emergency department.    I provided 18 minutes of non-face-to-face time during this encounter.    [name], MD

## 2018-10-18 ENCOUNTER — Other Ambulatory Visit: Payer: Self-pay | Admitting: Family Medicine

## 2018-10-18 DIAGNOSIS — F339 Major depressive disorder, recurrent, unspecified: Secondary | ICD-10-CM

## 2018-10-19 ENCOUNTER — Other Ambulatory Visit: Payer: Self-pay

## 2018-10-19 ENCOUNTER — Other Ambulatory Visit: Payer: Medicare Other

## 2018-10-20 MED ORDER — LORAZEPAM 0.5 MG PO TABS: 0.5000 mg | ORAL_TABLET | Freq: Every day | ORAL | 1 refills | Status: DC

## 2018-10-20 NOTE — Telephone Encounter (Signed)
Patient needs to have this filled in a visit, I do not know when she has another visit scheduled

## 2018-10-20 NOTE — Telephone Encounter (Signed)
Please let her know that I sent in a refill this time but make sure she brings it up in a visit if she is getting close to running out.

## 2018-10-20 NOTE — Addendum Note (Signed)
Addended by: Caryl Pina on: 10/20/2018 12:40 PM   Modules accepted: Orders

## 2018-10-20 NOTE — Telephone Encounter (Signed)
Patient did a telephone visit with you on 10-2 for regular follow up. Do you want me to schedule her an in office visit with you to take care of this?

## 2018-10-21 ENCOUNTER — Encounter (HOSPITAL_COMMUNITY): Payer: Self-pay | Admitting: Hematology

## 2018-10-21 ENCOUNTER — Inpatient Hospital Stay (HOSPITAL_BASED_OUTPATIENT_CLINIC_OR_DEPARTMENT_OTHER): Payer: Medicare Other | Admitting: Hematology

## 2018-10-21 ENCOUNTER — Other Ambulatory Visit: Payer: Self-pay

## 2018-10-21 VITALS — BP 161/67 | HR 81 | Temp 96.9°F | Resp 16 | Wt 233.9 lb

## 2018-10-21 DIAGNOSIS — Z853 Personal history of malignant neoplasm of breast: Secondary | ICD-10-CM | POA: Diagnosis not present

## 2018-10-21 DIAGNOSIS — Z170 Estrogen receptor positive status [ER+]: Secondary | ICD-10-CM | POA: Diagnosis not present

## 2018-10-21 DIAGNOSIS — C50912 Malignant neoplasm of unspecified site of left female breast: Secondary | ICD-10-CM | POA: Diagnosis not present

## 2018-10-21 NOTE — Patient Instructions (Signed)
[place] at [facility] Discharge Instructions  You were seen today by Dr. Delton Coombes. He went over your recent test results. Your mammogram was normal. He will see you back in 1 year for labs and follow up.   Thank you for choosing [place] at [facility] to provide your oncology and hematology care.  To afford each patient quality time with our provider, please arrive at least 15 minutes before your scheduled appointment time.   If you have a lab appointment with the [facility] please come in thru the  Main Entrance and check in at the main information desk  You need to re-schedule your appointment should you arrive 10 or more minutes late.  We strive to give you quality time with our providers, and arriving late affects you and other patients whose appointments are after yours.  Also, if you no show three or more times for appointments you may be dismissed from the clinic at the providers discretion.     Again, thank you for choosing [facility]  Our hope is that these requests will decrease the amount of time that you wait before being seen by our physicians.       _____________________________________________________________  Should you have questions after your visit to [facility], please contact our office at [phone] between the hours of 8:00 a.m. and 4:30 p.m.  Voicemails left after 4:00 p.m. will not be returned until the following business day.  For prescription refill requests, have your pharmacy contact our office and allow 72 hours.    Cancer Center Support Programs:   > Cancer Support Group  2nd Tuesday of the month 1pm-2pm, Journey Room

## 2018-10-21 NOTE — Progress Notes (Signed)
Anna Dickerson, Anna Dickerson   CLINIC:  Medical Oncology/Hematology  PCP:  Dettinger, Fransisca Kaufmann, MD [place] [id number] [phone]   REASON FOR VISIT:  Follow-up for left breast cancer.  CURRENT THERAPY: Surveillance.  BRIEF ONCOLOGIC HISTORY:  Oncology History   No history exists.     CANCER STAGING: Cancer Staging Stage I left-sided Breast cancer Staging form: Breast, AJCC 7th Edition - Clinical: Stage IA (T1a, N0, cM0) - Signed by Baird Cancer, PA on 12/31/2010    INTERVAL HISTORY:  Anna Dickerson 85 y.o. female seen for follow-up of left breast cancer.  She had mammogram done on 10/14/2018.  Denies any fevers, night sweats or weight loss in the last 1 year.  No infections or hospitalizations noted.  Denies any new onset pains.  Reported some constipation.  She is apparently taking 2 pills of stool softener daily.  Appetite is 50%.  Energy levels are 25%.    REVIEW OF SYSTEMS:  Review of Systems  Gastrointestinal: Positive for constipation.  All other systems reviewed and are negative.    PAST MEDICAL/SURGICAL HISTORY:  Past Medical History:  Diagnosis Date  . B12 deficiency 02/08/2015  . Cancer [facility] [id number]   breast  . Chronic kidney disease   . Diabetes mellitus   . DM (diabetes mellitus) ([place]) 12/31/2010  . HTN (hypertension) 12/31/2010  . Hypertension   . Iron deficiency 02/24/2012  . Macular degeneration 12/31/2010  . Macular degeneration, left eye   . OAB (overactive bladder)   . Obesity 12/31/2010  . Stage I left-sided Breast cancer 12/31/2010   Past Surgical History:  Procedure Laterality Date  . bilateral cataract with intraocular lens implant    . Bladder tacked-up    . BREAST SURGERY  left    . REPLACEMENT TOTAL KNEE BILATERAL Bilateral    TKR were ar different times about 2 yeras apart.  Marland Kitchen SKIN CANCER EXCISION     squamous cell right ear and calf right leg     SOCIAL  HISTORY:  Social History   Socioeconomic History  . Marital status: Widowed    Spouse name: Not on file  . Number of children: 4  . Years of education: Not on file  . Highest education level: High school graduate  Occupational History  . 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 Financial resource strain: Not hard at all  . Food insecurity    Worry: Never true    Inability: Never true  . Transportation needs    Medical: No    Non-medical: No  Tobacco Use  . Smoking status: Never Smoker  . Smokeless tobacco: Never Used  Substance and Sexual Activity  . Alcohol use: No  . Drug use: No  . Sexual activity: Not on file  Lifestyle  . Physical activity    Days per week: 0 days    Minutes per session: 0 min  . Stress: Only a little  Relationships  . Social connections    Talks on phone: More than three times a week    Gets together: More than three times a week    Attends religious service: More than 4 times per year    Active member of club or organization: Yes    Attends meetings of clubs or organizations: More than 4 times per year    Relationship status: Widowed  .  Intimate partner violence    Fear of current or ex partner: No    Emotionally abused: No    Physically abused: No    Forced sexual activity: No  Other Topics Concern  . Not on file  Social History Narrative  . Not on file    FAMILY HISTORY:  Family History  Problem Relation Age of Onset  . Cancer Mother   . Cancer Sister   . Heart attack Brother   . Arthritis Sister   . Anxiety disorder Brother   . Arthritis Sister     CURRENT MEDICATIONS:  Outpatient Encounter Medications as of 10/21/2018  Medication Sig  . ACCU-CHEK AVIVA PLUS test strip CHECK BLOOD SUGAR UP TO 3 TIMES A DAY AS DIRECTED  . ACCU-CHEK SOFTCLIX LANCETS lancets CHECK BLOOD SUGAR UP TO 3 TIMES A DAY AS DIRECTED  . amLODipine (NORVASC) 10 MG tablet Take 1 tablet (10  mg total) by mouth daily.  Marland Kitchen aspirin 81 MG tablet Take 81 mg by mouth daily.    Marland Kitchen atorvastatin (LIPITOR) 20 MG tablet Take 1 tablet (20 mg total) by mouth daily at 6 PM.  . Blood Glucose Monitoring Suppl (ACCU-CHEK AVIVA PLUS) w/Device KIT Check BS up to three times daily  . carvedilol (COREG) 25 MG tablet Take 25 mg by mouth daily.  . cloNIDine (CATAPRES) 0.1 MG tablet Take 1 tablet (0.1 mg total) by mouth 3 (three) times daily.  . Continuous Blood Gluc Receiver (FREESTYLE LIBRE 14 DAY READER) DEVI 1 each by Does not apply route 2 (two) times daily.  . Continuous Blood Gluc Sensor (FREESTYLE LIBRE 14 DAY SENSOR) MISC 1 each by Does not apply route every 14 (fourteen) days.  . furosemide (LASIX) 20 MG tablet Take 1 tablet (20 mg total) by mouth daily.  . Melatonin 5 MG TABS Take 1.5 tablets (7.5 mg total) by mouth at bedtime.  Marland Kitchen MELATONIN/VITAMIN B-6 EX ST 5-1 MG TABS TAKE ONE TABLET AT BEDTIME  . Multiple Vitamins-Minerals (PRESERVISION AREDS 2) CAPS Take 2 capsules by mouth 2 (two) times daily.  Marland Kitchen venlafaxine XR (EFFEXOR-XR) 150 MG 24 hr capsule TAKE 1 CAPSULE ONCE DAILY WITH BREAKFAST  . Vitamin D, Ergocalciferol, (DRISDOL) 50000 units CAPS capsule TAKE 1 CAPSULE ONCE A WEEK  . fluticasone (FLONASE) 50 MCG/ACT nasal spray Place 1 spray into both nostrils 2 (two) times daily as needed for allergies or rhinitis. (Patient not taking: Reported on 10/21/2018)  . LORazepam (ATIVAN) 0.5 MG tablet Take 1 tablet (0.5 mg total) by mouth at bedtime. (Patient not taking: Reported on 10/21/2018)  . [DISCONTINUED] metoprolol succinate (TOPROL-XL) 100 MG 24 hr tablet Take 1 tablet (100 mg total) by mouth daily. Take with or immediately following a meal.  . [DISCONTINUED] Omega-3 Fatty Acids (FISH OIL) 1000 MG CAPS Take 1,000 mg by mouth 2 (two) times daily.  . [DISCONTINUED] TRULICITY 2.13 YQ/6.5HQ SOPN INJECT 0.75 MG ONCE A WEEK   No facility-administered encounter medications on file as of 10/21/2018.      ALLERGIES:  Allergies  Allergen Reactions  . Sulfa Antibiotics     Mouth ulcers     PHYSICAL EXAM:  ECOG Performance status: 2  Vitals:   10/21/18 1411  BP: (!) 161/67  Pulse: 81  Resp: 16  Temp: (!) 96.9 F (36.1 C)  SpO2: 97%   Filed Weights   10/21/18 1411  Weight: 233 lb 14.4 oz (106.1 kg)    Physical Exam Vitals signs reviewed.  Constitutional:  Appearance: Normal appearance.  Cardiovascular:     Rate and Rhythm: Normal rate and regular rhythm.     Heart sounds: Normal heart sounds.  Pulmonary:     Effort: Pulmonary effort is normal.     Breath sounds: Normal breath sounds.  Abdominal:     General: There is no distension.     Palpations: Abdomen is soft. There is no mass.  Musculoskeletal:        General: No swelling.  Skin:    General: Skin is warm.  Neurological:     General: No focal deficit present.     Mental Status: She is alert and oriented to person, place, and time.  Psychiatric:        Mood and Affect: Mood normal.        Behavior: Behavior normal.    Left breast lumpectomy site is within normal limits.  No palpable mass in bilateral eyes.  No axillary adenopathy.  LABORATORY DATA:  I have reviewed the labs as listed.  CBC    Component Value Date/Time   WBC 10.9 (H) 10/14/2018 1157   RBC 3.82 (L) 10/14/2018 1157   HGB 10.8 (L) 10/14/2018 1157   HGB 11.7 03/10/2018 1455   HCT 34.5 (L) 10/14/2018 1157   HCT 34.1 03/10/2018 1455   PLT 190 10/14/2018 1157   PLT 218 03/10/2018 1455   MCV 90.3 10/14/2018 1157   MCV 86 03/10/2018 1455   MCH 28.3 10/14/2018 1157   MCHC 31.3 10/14/2018 1157   RDW 14.6 10/14/2018 1157   RDW 13.2 03/10/2018 1455   LYMPHSABS 2.3 10/14/2018 1157   LYMPHSABS 2.4 03/10/2018 1455   MONOABS 0.7 10/14/2018 1157   EOSABS 0.4 10/14/2018 1157   EOSABS 0.4 03/10/2018 1455   BASOSABS 0.1 10/14/2018 1157   BASOSABS 0.1 03/10/2018 1455   CMP Latest Ref Rng & Units 10/14/2018 06/09/2018 03/10/2018  Glucose 70 -  99 mg/dL 213(H) 210(H) 115(H)  BUN 8 - 23 mg/dL 37(H) 33(H) 32(H)  Creatinine 0.44 - 1.00 mg/dL 2.26(H) 2.08(H) 1.96(H)  Sodium 135 - 145 mmol/L 141 144 143  Potassium 3.5 - 5.1 mmol/L 3.4(L) 3.7 3.9  Chloride 98 - 111 mmol/L 102 99 102  CO2 22 - 32 mmol/L _0 Calcium 8.9 - 10.3 mg/dL 9.2 9.4 9.1  Total Protein 6.5 - 8.1 g/dL 7.4 - 7.1  Total Bilirubin 0.3 - 1.2 mg/dL 0.5 - 0.3  Alkaline Phos 38 - 126 U/L 71 - 103  AST 15 - 41 U/L 15 - 12  ALT 0 - 44 U/L 14 - 13       DIAGNOSTIC IMAGING:  I have independently reviewed the scans and discussed with the patient.   ASSESSMENT & PLAN:   Stage I left-sided Breast cancer 1.  Stage I (T1 a N0 M0) left breast invasive ductal carcinoma: - Status post lumpectomy in August 2007, 4 mm IDC, ER/PR positive, HER-2 negative, Ki-67 8%, high-grade DCIS. -Status post XRT to the left breast. -Status post 5 years of tamoxifen completed in 2012. - Physical examination today did not reveal any palpable masses.  No palpable adenopathy. - I have reviewed bilateral mammograms dated 10/14/2018 which was BI-RADS Category 1. -She will come back in 1 year for follow-up with repeat mammogram.  2.  CKD: -Her creatinine has gotten slightly worse at 2.26.  She is on Lasix 20 mg daily. - She has follow-up visit with Dr. Lowanda Foster next week.      Orders placed this  encounter:  Orders Placed This Encounter  Procedures  . MM 3D SCREEN BREAST BILATERAL  . CBC with Differential/Platelet  . Comprehensive metabolic panel      Derek Jack, MD [place] [phone]

## 2018-10-21 NOTE — Assessment & Plan Note (Signed)
1.  Stage I (T1 a N0 M0) left breast invasive ductal carcinoma: - Status post lumpectomy in August 2007, 4 mm IDC, ER/PR positive, HER-2 negative, Ki-67 8%, high-grade DCIS. -Status post XRT to the left breast. -Status post 5 years of tamoxifen completed in 2012. - Physical examination today did not reveal any palpable masses.  No palpable adenopathy. - I have reviewed bilateral mammograms dated 10/14/2018 which was BI-RADS Category 1. -She will come back in 1 year for follow-up with repeat mammogram.  2.  CKD: -Her creatinine has gotten slightly worse at 2.26.  She is on Lasix 20 mg daily. - She has follow-up visit with Dr. Lowanda Foster next week.

## 2018-11-16 ENCOUNTER — Other Ambulatory Visit: Payer: Self-pay | Admitting: Family Medicine

## 2018-11-16 DIAGNOSIS — F329 Major depressive disorder, single episode, unspecified: Secondary | ICD-10-CM

## 2018-11-16 DIAGNOSIS — F419 Anxiety disorder, unspecified: Secondary | ICD-10-CM

## 2018-12-02 ENCOUNTER — Other Ambulatory Visit: Payer: Self-pay | Admitting: *Deleted

## 2018-12-02 MED ORDER — FUROSEMIDE 20 MG PO TABS: 20.0000 mg | ORAL_TABLET | Freq: Every day | ORAL | 0 refills | Status: DC

## 2018-12-02 NOTE — Telephone Encounter (Signed)
Fax from [facility] written by Dr. Lowanda Foster for Furosemide 20 mg 2 BID Please verify sig

## 2018-12-02 NOTE — Telephone Encounter (Signed)
I do not know her signature for sure because we have not prescribed it, please call the patient and ask her if she needs Lasix twice a day or once a day, it may have only been twice a day for short period of time.

## 2018-12-20 ENCOUNTER — Other Ambulatory Visit: Payer: Self-pay | Admitting: Family Medicine

## 2018-12-20 DIAGNOSIS — F339 Major depressive disorder, recurrent, unspecified: Secondary | ICD-10-CM

## 2018-12-24 ENCOUNTER — Other Ambulatory Visit: Payer: Self-pay

## 2018-12-24 ENCOUNTER — Other Ambulatory Visit: Payer: Medicare Other

## 2018-12-28 ENCOUNTER — Other Ambulatory Visit: Payer: Self-pay | Admitting: Family Medicine

## 2018-12-28 DIAGNOSIS — I10 Essential (primary) hypertension: Secondary | ICD-10-CM

## 2018-12-29 ENCOUNTER — Ambulatory Visit (INDEPENDENT_AMBULATORY_CARE_PROVIDER_SITE_OTHER): Payer: Medicare Other | Admitting: Family Medicine

## 2018-12-29 ENCOUNTER — Encounter: Payer: Self-pay | Admitting: Family Medicine

## 2018-12-29 DIAGNOSIS — E1140 Type 2 diabetes mellitus with diabetic neuropathy, unspecified: Secondary | ICD-10-CM | POA: Diagnosis not present

## 2018-12-29 DIAGNOSIS — F329 Major depressive disorder, single episode, unspecified: Secondary | ICD-10-CM

## 2018-12-29 DIAGNOSIS — F339 Major depressive disorder, recurrent, unspecified: Secondary | ICD-10-CM | POA: Diagnosis not present

## 2018-12-29 DIAGNOSIS — F419 Anxiety disorder, unspecified: Secondary | ICD-10-CM | POA: Diagnosis not present

## 2018-12-29 DIAGNOSIS — F32A Depression, unspecified: Secondary | ICD-10-CM

## 2018-12-29 MED ORDER — LORAZEPAM 0.5 MG PO TABS: 0.5000 mg | ORAL_TABLET | Freq: Every day | ORAL | 2 refills | Status: DC

## 2018-12-29 MED ORDER — VENLAFAXINE HCL ER 150 MG PO CP24: 150.0000 mg | ORAL_CAPSULE | Freq: Every day | ORAL | 3 refills | Status: DC

## 2018-12-29 MED ORDER — CARVEDILOL 25 MG PO TABS: 25.0000 mg | ORAL_TABLET | Freq: Every day | ORAL | 3 refills | Status: DC

## 2018-12-29 NOTE — Progress Notes (Signed)
Virtual Visit via telephone Note  I connected with Anna Dickerson on 12/29/18 at 1258 by telephone and verified that I am speaking with the correct person using two identifiers. Anna Dickerson is currently located at home and no other people are currently with her during visit. The provider, Fransisca Kaufmann Donzel Romack, MD is located in their office at time of visit.  Call ended at 1311  I discussed the limitations, risks, security and privacy concerns of performing an evaluation and management service by telephone and the availability of in person appointments. I also discussed with the patient that there may be a patient responsible charge related to this service. The patient expressed understanding and agreed to proceed.   History and Present Illness: Type 2 diabetes mellitus Patient comes in today for recheck of his diabetes. Patient has been currently taking Trulicity. Patient is not currently on an ACE inhibitor/ARB because of renal disease. Patient has not seen an ophthalmologist this year. Patient denies any issues with their feet. Patient has ckd, she sees nephrology for this and it has been stable  Hypertension Patient is currently on amlodipine and clonidine and furosemide and carvedilol, and their blood pressure today is unknown because she has not checked it but she says it has been running up and down and high and low like it usually does sometimes.. Patient denies any lightheadedness or dizziness. Patient denies headaches, blurred vision, chest pains, shortness of breath, or weakness. Denies any side effects from medication and is content with current medication.   Anxiety recheck Patient is currently on ativan and venlafaxine and feels like anxiety is doing well and denies any issues Current rx-lorazepam 0.5 mg nightly as needed # meds rx-30 Effectiveness of current meds-works well, she only uses it infrequently for when she cannot sleep Adverse reactions form  meds-none  Pill count performed-No Last drug screen -06/16/2018 ( high risk q57m moderate risk q658mlow risk yearly ) Urine drug screen today- No [place]  If yes were their any concerning findings? -None  No flowsheet data found.   Controlled substance contract signed on: 06/16/2018  1. Type 2 diabetes mellitus with diabetic neuropathy, without long-term current use of insulin ([place]  2. Depression, recurrent ([place]  3. Anxiety and depression     Outpatient Encounter Medications as of 12/29/2018  Medication Sig  . ACCU-CHEK AVIVA PLUS test strip CHECK BLOOD SUGAR UP TO 3 TIMES A DAY AS DIRECTED  . ACCU-CHEK SOFTCLIX LANCETS lancets CHECK BLOOD SUGAR UP TO 3 TIMES A DAY AS DIRECTED  . amLODipine (NORVASC) 10 MG tablet Take 1 tablet (10 mg total) by mouth daily.  . Marland Kitchenspirin 81 MG tablet Take 81 mg by mouth daily.    . Marland Kitchentorvastatin (LIPITOR) 20 MG tablet Take 1 tablet (20 mg total) by mouth daily at 6 PM.  . Blood Glucose Monitoring Suppl (ACCU-CHEK AVIVA PLUS) w/Device KIT Check BS up to three times daily  . carvedilol (COREG) 25 MG tablet Take 25 mg by mouth daily.  . cloNIDine (CATAPRES) 0.1 MG tablet Take 1 tablet (0.1 mg total) by mouth 3 (three) times daily.  . Continuous Blood Gluc Receiver (FREESTYLE LIBRE 14 DAY READER) DEVI 1 each by Does not apply route 2 (two) times daily.  . Continuous Blood Gluc Sensor (FREESTYLE LIBRE 14 DAY SENSOR) MISC 1 each by Does not apply route every 14 (fourteen) days.  . fluticasone (FLONASE) 50 MCG/ACT nasal spray Place 1 spray into both nostrils 2 (  two) times daily as needed for allergies or rhinitis. (Patient not taking: Reported on 10/21/2018)  . furosemide (LASIX) 20 MG tablet Take 1 tablet (20 mg total) by mouth daily.  Marland Kitchen LORazepam (ATIVAN) 0.5 MG tablet Take 1 tablet (0.5 mg total) by mouth at bedtime.  . Melatonin 5 MG TABS Take 1.5 tablets (7.5 mg total) by mouth at bedtime.  Marland Kitchen MELATONIN/VITAMIN B-6 EX ST 5-1 MG TABS TAKE  ONE TABLET AT BEDTIME  . Multiple Vitamins-Minerals (PRESERVISION AREDS 2) CAPS Take 2 capsules by mouth 2 (two) times daily.  . TRULICITY 6.38 VF/6.4PP SOPN INJECT 0.75 MG ONCE A WEEK  . venlafaxine XR (EFFEXOR-XR) 150 MG 24 hr capsule Take 1 capsule (150 mg total) by mouth daily with breakfast.  . Vitamin D, Ergocalciferol, (DRISDOL) 50000 units CAPS capsule TAKE 1 CAPSULE ONCE A WEEK  . [DISCONTINUED] LORazepam (ATIVAN) 0.5 MG tablet Take 1 tablet (0.5 mg total) by mouth at bedtime. (Patient not taking: Reported on 10/21/2018)  . [DISCONTINUED] venlafaxine XR (EFFEXOR-XR) 150 MG 24 hr capsule TAKE 1 CAPSULE ONCE DAILY WITH BREAKFAST   No facility-administered encounter medications on file as of 12/29/2018.    Review of Systems  Constitutional: Negative for chills and fever.  HENT: Negative for congestion, ear discharge and ear pain.   Eyes: Negative for redness and visual disturbance.  Respiratory: Negative for chest tightness and shortness of breath.   Cardiovascular: Negative for chest pain and leg swelling.  Genitourinary: Negative for difficulty urinating and dysuria.  Musculoskeletal: Negative for back pain and gait problem.  Skin: Negative for rash.  Neurological: Negative for light-headedness and headaches.  Psychiatric/Behavioral: Negative for agitation and behavioral problems.  All other systems reviewed and are negative.   Observations/Objective: Patient sounds comfortable and in no acute distress  Assessment and Plan: Problem List Items Addressed This Visit      Endocrine   Type 2 diabetes mellitus with diabetic neuropathy, without long-term current use of insulin (HCC) - Primary     Other   Depression, recurrent (HCC)   Relevant Medications   LORazepam (ATIVAN) 0.5 MG tablet   venlafaxine XR (EFFEXOR-XR) 150 MG 24 hr capsule    Other Visit Diagnoses    Anxiety and depression       Relevant Medications   LORazepam (ATIVAN) 0.5 MG tablet   venlafaxine XR  (EFFEXOR-XR) 150 MG 24 hr capsule       Follow up plan: Return in about 3 months (around 03/29/2019), or if symptoms worsen or fail to improve, for htn and anxiety.  Patient is continue current medication, gave refills, her blood pressure is mostly being managed by her nephrologist.    I discussed the assessment and treatment plan with the patient. The patient was provided an opportunity to ask questions and all were answered. The patient agreed with the plan and demonstrated an understanding of the instructions.   The patient was advised to call back or seek an in-person evaluation if the symptoms worsen or if the condition fails to improve as anticipated.  The above assessment and management plan was discussed with the patient. The patient verbalized understanding of and has agreed to the management plan. Patient is aware to call the clinic if symptoms persist or worsen. Patient is aware when to return to the clinic for a follow-up visit. Patient educated on when it is appropriate to go to the emergency department.    I provided 13 minutes of non-face-to-face time during this encounter.    Vonna Kotyk  A Kayan Blissett, MD

## 2018-12-29 NOTE — Addendum Note (Signed)
Addended by: Caryl Pina on: 12/29/2018 01:28 PM   Modules accepted: Orders

## 2018-12-31 ENCOUNTER — Other Ambulatory Visit: Payer: Medicare Other

## 2018-12-31 ENCOUNTER — Other Ambulatory Visit: Payer: Self-pay

## 2019-01-11 ENCOUNTER — Other Ambulatory Visit: Payer: Self-pay

## 2019-01-11 ENCOUNTER — Other Ambulatory Visit: Payer: Medicare Other

## 2019-01-17 ENCOUNTER — Other Ambulatory Visit: Payer: Self-pay | Admitting: Family Medicine

## 2019-01-31 ENCOUNTER — Other Ambulatory Visit: Payer: Self-pay | Admitting: Family Medicine

## 2019-01-31 DIAGNOSIS — E785 Hyperlipidemia, unspecified: Secondary | ICD-10-CM

## 2019-02-01 ENCOUNTER — Other Ambulatory Visit: Payer: Medicare PPO

## 2019-02-01 ENCOUNTER — Other Ambulatory Visit: Payer: Self-pay

## 2019-02-01 DIAGNOSIS — N184 Chronic kidney disease, stage 4 (severe): Secondary | ICD-10-CM | POA: Diagnosis not present

## 2019-02-01 DIAGNOSIS — Z79899 Other long term (current) drug therapy: Secondary | ICD-10-CM | POA: Diagnosis not present

## 2019-02-04 DIAGNOSIS — I5032 Chronic diastolic (congestive) heart failure: Secondary | ICD-10-CM | POA: Diagnosis not present

## 2019-02-04 DIAGNOSIS — R809 Proteinuria, unspecified: Secondary | ICD-10-CM | POA: Diagnosis not present

## 2019-02-04 DIAGNOSIS — N189 Chronic kidney disease, unspecified: Secondary | ICD-10-CM | POA: Diagnosis not present

## 2019-02-04 DIAGNOSIS — E1129 Type 2 diabetes mellitus with other diabetic kidney complication: Secondary | ICD-10-CM | POA: Diagnosis not present

## 2019-02-04 DIAGNOSIS — E1122 Type 2 diabetes mellitus with diabetic chronic kidney disease: Secondary | ICD-10-CM | POA: Diagnosis not present

## 2019-02-04 DIAGNOSIS — D631 Anemia in chronic kidney disease: Secondary | ICD-10-CM | POA: Diagnosis not present

## 2019-02-04 DIAGNOSIS — R600 Localized edema: Secondary | ICD-10-CM | POA: Diagnosis not present

## 2019-02-04 DIAGNOSIS — N170 Acute kidney failure with tubular necrosis: Secondary | ICD-10-CM | POA: Diagnosis not present

## 2019-02-04 DIAGNOSIS — I129 Hypertensive chronic kidney disease with stage 1 through stage 4 chronic kidney disease, or unspecified chronic kidney disease: Secondary | ICD-10-CM | POA: Diagnosis not present

## 2019-02-11 DIAGNOSIS — H35373 Puckering of macula, bilateral: Secondary | ICD-10-CM | POA: Diagnosis not present

## 2019-02-11 DIAGNOSIS — H353123 Nonexudative age-related macular degeneration, left eye, advanced atrophic without subfoveal involvement: Secondary | ICD-10-CM | POA: Diagnosis not present

## 2019-02-11 DIAGNOSIS — H43813 Vitreous degeneration, bilateral: Secondary | ICD-10-CM | POA: Diagnosis not present

## 2019-02-11 DIAGNOSIS — H353211 Exudative age-related macular degeneration, right eye, with active choroidal neovascularization: Secondary | ICD-10-CM | POA: Diagnosis not present

## 2019-02-14 ENCOUNTER — Other Ambulatory Visit: Payer: Self-pay | Admitting: Family Medicine

## 2019-02-28 ENCOUNTER — Other Ambulatory Visit: Payer: Self-pay | Admitting: Family Medicine

## 2019-02-28 MED ORDER — CARVEDILOL 25 MG PO TABS: 25.0000 mg | ORAL_TABLET | Freq: Two times a day (BID) | ORAL | 3 refills | Status: DC

## 2019-03-02 DIAGNOSIS — L570 Actinic keratosis: Secondary | ICD-10-CM | POA: Diagnosis not present

## 2019-03-02 DIAGNOSIS — L821 Other seborrheic keratosis: Secondary | ICD-10-CM | POA: Diagnosis not present

## 2019-03-02 DIAGNOSIS — Z85828 Personal history of other malignant neoplasm of skin: Secondary | ICD-10-CM | POA: Diagnosis not present

## 2019-03-02 DIAGNOSIS — D044 Carcinoma in situ of skin of scalp and neck: Secondary | ICD-10-CM | POA: Diagnosis not present

## 2019-03-02 DIAGNOSIS — D485 Neoplasm of uncertain behavior of skin: Secondary | ICD-10-CM | POA: Diagnosis not present

## 2019-03-18 ENCOUNTER — Other Ambulatory Visit: Payer: Self-pay

## 2019-03-18 ENCOUNTER — Other Ambulatory Visit: Payer: Medicare PPO

## 2019-03-18 DIAGNOSIS — E1122 Type 2 diabetes mellitus with diabetic chronic kidney disease: Secondary | ICD-10-CM | POA: Diagnosis not present

## 2019-03-18 DIAGNOSIS — R809 Proteinuria, unspecified: Secondary | ICD-10-CM | POA: Diagnosis not present

## 2019-03-18 DIAGNOSIS — I129 Hypertensive chronic kidney disease with stage 1 through stage 4 chronic kidney disease, or unspecified chronic kidney disease: Secondary | ICD-10-CM | POA: Diagnosis not present

## 2019-03-18 DIAGNOSIS — N189 Chronic kidney disease, unspecified: Secondary | ICD-10-CM | POA: Diagnosis not present

## 2019-03-18 DIAGNOSIS — D631 Anemia in chronic kidney disease: Secondary | ICD-10-CM | POA: Diagnosis not present

## 2019-03-18 DIAGNOSIS — N170 Acute kidney failure with tubular necrosis: Secondary | ICD-10-CM | POA: Diagnosis not present

## 2019-03-18 DIAGNOSIS — R600 Localized edema: Secondary | ICD-10-CM | POA: Diagnosis not present

## 2019-03-18 DIAGNOSIS — E1129 Type 2 diabetes mellitus with other diabetic kidney complication: Secondary | ICD-10-CM | POA: Diagnosis not present

## 2019-03-18 DIAGNOSIS — I5032 Chronic diastolic (congestive) heart failure: Secondary | ICD-10-CM | POA: Diagnosis not present

## 2019-03-21 ENCOUNTER — Other Ambulatory Visit: Payer: Self-pay | Admitting: Family Medicine

## 2019-03-21 DIAGNOSIS — F339 Major depressive disorder, recurrent, unspecified: Secondary | ICD-10-CM

## 2019-03-21 DIAGNOSIS — I10 Essential (primary) hypertension: Secondary | ICD-10-CM

## 2019-03-25 DIAGNOSIS — Z79899 Other long term (current) drug therapy: Secondary | ICD-10-CM | POA: Diagnosis not present

## 2019-03-25 DIAGNOSIS — E211 Secondary hyperparathyroidism, not elsewhere classified: Secondary | ICD-10-CM | POA: Diagnosis not present

## 2019-03-25 DIAGNOSIS — R809 Proteinuria, unspecified: Secondary | ICD-10-CM | POA: Diagnosis not present

## 2019-03-25 DIAGNOSIS — I5032 Chronic diastolic (congestive) heart failure: Secondary | ICD-10-CM | POA: Diagnosis not present

## 2019-03-25 DIAGNOSIS — I129 Hypertensive chronic kidney disease with stage 1 through stage 4 chronic kidney disease, or unspecified chronic kidney disease: Secondary | ICD-10-CM | POA: Diagnosis not present

## 2019-03-25 DIAGNOSIS — E1122 Type 2 diabetes mellitus with diabetic chronic kidney disease: Secondary | ICD-10-CM | POA: Diagnosis not present

## 2019-03-25 DIAGNOSIS — E1129 Type 2 diabetes mellitus with other diabetic kidney complication: Secondary | ICD-10-CM | POA: Diagnosis not present

## 2019-03-25 DIAGNOSIS — D631 Anemia in chronic kidney disease: Secondary | ICD-10-CM | POA: Diagnosis not present

## 2019-03-25 DIAGNOSIS — N185 Chronic kidney disease, stage 5: Secondary | ICD-10-CM | POA: Diagnosis not present

## 2019-03-26 ENCOUNTER — Other Ambulatory Visit: Payer: Self-pay | Admitting: Family Medicine

## 2019-03-26 DIAGNOSIS — F339 Major depressive disorder, recurrent, unspecified: Secondary | ICD-10-CM

## 2019-03-28 ENCOUNTER — Ambulatory Visit: Payer: Self-pay | Admitting: Family Medicine

## 2019-03-30 ENCOUNTER — Telehealth: Payer: Self-pay

## 2019-03-30 ENCOUNTER — Encounter: Payer: Self-pay | Admitting: Family Medicine

## 2019-03-30 NOTE — Telephone Encounter (Signed)
Attempted to do a prior authorization for patients Trulicity. Went through [place] process and Cover My meds stated that patient doesn't need prior auth and can pick up meds at pharmacy. Called to verify information and they also said that patient can pick up meds at pharmacy at this time.

## 2019-03-31 ENCOUNTER — Ambulatory Visit (INDEPENDENT_AMBULATORY_CARE_PROVIDER_SITE_OTHER): Payer: Medicare PPO

## 2019-03-31 DIAGNOSIS — Z0000 Encounter for general adult medical examination without abnormal findings: Secondary | ICD-10-CM

## 2019-03-31 NOTE — Progress Notes (Signed)
MEDICARE ANNUAL WELLNESS VISIT  03/31/2019  Telephone Visit Disclaimer This Medicare AWV was conducted by telephone due to national recommendations for restrictions regarding the COVID-19 Pandemic (e.g. social distancing).  I verified, using two identifiers, that I am speaking with Anna Dickerson or their authorized healthcare agent. I discussed the limitations, risks, security, and privacy concerns of performing an evaluation and management service by telephone and the potential availability of an in-person appointment in the future. The patient expressed understanding and agreed to proceed.   Subjective:  Anna Dickerson is a 86 y.o. female patient of Dettinger, Fransisca Kaufmann, MD who had a Medicare Annual Wellness Visit today via telephone. Dave is Retired and lives alone. she has 4 children. she reports that she is socially active and does interact with friends/family regularly. she is minimally physically active and enjoys staying busy and moving around all the time.  Patient Care Team: Dettinger, Fransisca Kaufmann, MD as PCP - General (Family Medicine) Sherlynn Stalls, MD as Consulting Physician (Ophthalmology) Rolm Bookbinder, MD as Consulting Physician (Dermatology) Liana Gerold, MD as Consulting Physician (Nephrology)  Advanced Directives 03/31/2019 10/21/2018 03/29/2018 12/16/2017 10/19/2017 02/03/2017 01/08/2017  Does Patient Have a Medical Advance Directive? No Yes Yes No Yes Yes Yes  Type of Advance Directive - Healthcare Power of [place];Living will [place];Living will [place]  Does patient want to make changes to medical advance directive? - No - Patient declined No - Patient declined - No - Patient declined No - Patient declined -  Copy of [name] in Chart? - No - copy requested No - copy requested - No - copy requested No - copy requested No - copy  requested  Would patient like information on creating a medical advance directive? No - Patient declined - - No - Patient declined - - [facility] Utilization Over the Past 12 Months: # of hospitalizations or ER visits: 0 # of surgeries: 0  Review of Systems    Patient reports that her overall health is unchanged compared to last year.  History obtained from chart review  Patient Reported Readings (BP, Pulse, CBG, Weight, etc) none  Pain Assessment Pain : 0-10 Pain Score: 5  Pain Type: Chronic pain Pain Location: Leg     Current Medications & Allergies (verified) Allergies as of 03/31/2019      Reactions   Sulfa Antibiotics    Mouth ulcers      Medication List       Accurate as of March 31, 2019  3:14 PM. If you have any questions, ask your nurse or doctor.        Accu-Chek Aviva Plus test strip Generic drug: glucose blood CHECK BLOOD SUGAR UP TO 3 TIMES A DAY AS DIRECTED   Accu-Chek Aviva Plus w/Device Kit Check BS up to three times daily   Accu-Chek Softclix Lancets lancets CHECK BLOOD SUGAR UP TO 3 TIMES A DAY AS DIRECTED   amLODipine 10 MG tablet Commonly known as: NORVASC TAKE 1 TABLET ONCE DAILY   aspirin 81 MG tablet Take 81 mg by mouth daily.   atorvastatin 20 MG tablet Commonly known as: LIPITOR Take 1 tablet (20 mg total) by mouth daily at 6 PM.   carvedilol 25 MG tablet Commonly known as: COREG Take 1 tablet (25 mg total) by mouth 2 (two) times daily with a meal.   cloNIDine 0.1  MG tablet Commonly known as: CATAPRES Take 1 tablet (0.1 mg total) by mouth 3 (three) times daily.   fluticasone 50 MCG/ACT nasal spray Commonly known as: FLONASE Place 1 spray into both nostrils 2 (two) times daily as needed for allergies or rhinitis.   FreeStyle Libre 14 Day Reader Kerrin Mo 1 each by Does not apply route 2 (two) times daily.   FreeStyle Libre 14 Day Sensor Misc 1 each by Does not apply route every 14 (fourteen) days.   furosemide 20 MG  tablet Commonly known as: LASIX TAKE 1 TABLET DAILY   LORazepam 0.5 MG tablet Commonly known as: ATIVAN Take 1 tablet (0.5 mg total) by mouth at bedtime.   Melatonin 5 MG Tabs Take 1.5 tablets (7.5 mg total) by mouth at bedtime.   Melatonin/Vitamin B-6 Ex St 5-1 MG Tabs Generic drug: Melatonin-Pyridoxine TAKE ONE TABLET AT BEDTIME   PreserVision AREDS 2 Caps Take 2 capsules by mouth 2 (two) times daily.   Trulicity 4.08 XK/4.8JE Sopn Generic drug: Dulaglutide INJECT 0.75 MG ONCE A WEEK   venlafaxine XR 150 MG 24 hr capsule Commonly known as: EFFEXOR-XR Take 1 capsule (150 mg total) by mouth daily with breakfast.   Vitamin D (Ergocalciferol) 1.25 MG (50000 UNIT) Caps capsule Commonly known as: DRISDOL TAKE 1 CAPSULE ONCE A WEEK       History (reviewed): Past Medical History:  Diagnosis Date  . B12 deficiency 02/08/2015  . Cancer [facility]) [id number]   breast  . Chronic kidney disease   . Diabetes mellitus   . DM (diabetes mellitus) ([place]) 12/31/2010  . HTN (hypertension) 12/31/2010  . Hypertension   . Iron deficiency 02/24/2012  . Macular degeneration 12/31/2010  . Macular degeneration, left eye   . OAB (overactive bladder)   . Obesity 12/31/2010  . Stage I left-sided Breast cancer 12/31/2010   Past Surgical History:  Procedure Laterality Date  . bilateral cataract with intraocular lens implant    . Bladder tacked-up    . BREAST SURGERY  left    . REPLACEMENT TOTAL KNEE BILATERAL Bilateral    TKR were ar different times about 2 yeras apart.  Marland Kitchen SKIN CANCER EXCISION     squamous cell right ear and calf right leg   Family History  Problem Relation Age of Onset  . Cancer Mother   . Cancer Sister   . Heart attack Brother   . Arthritis Sister   . Anxiety disorder Brother   . Arthritis Sister    Social History   Socioeconomic History  . Marital status: Widowed    Spouse name: Not on file  . Number of children: 4  . Years of education: Not on file  .  Highest education level: High school graduate  Occupational History  . Occupation: Retired    Fish farm manager: [employer]    Comment: Consulting civil engineer  Tobacco Use  . Smoking status: Never Smoker  . Smokeless tobacco: Never Used  Substance and Sexual Activity  . Alcohol use: No  . Drug use: No  . Sexual activity: Not on file  Other Topics Concern  . Not on file  Social History Narrative  . Not on file   Social Determinants of Health   Financial Resource Strain:   . Difficulty of Paying Living Expenses:   Food Insecurity:   . Worried About Charity fundraiser in the Last Year:   . Arboriculturist in the Last Year:   Transportation Needs:   . Lack  of Transportation (Medical):   Marland Kitchen Lack of Transportation (Non-Medical):   Physical Activity:   . Days of Exercise per Week:   . Minutes of Exercise per Session:   Stress:   . Feeling of Stress :   Social Connections:   . Frequency of Communication with Friends and Family:   . Frequency of Social Gatherings with Friends and Family:   . Attends Religious Services:   . Active Member of Clubs or Organizations:   . Attends Archivist Meetings:   Marland Kitchen Marital Status:     Activities of Daily Living In your present state of health, do you have any difficulty performing the following activities: 03/31/2019  Hearing? Y  Comment Patient has hearing aids  Vision? Y  Comment Wears glasses all the time  Difficulty concentrating or making decisions? N  Walking or climbing stairs? Y  Comment Not easy but patient can do it  Dressing or bathing? N  Doing errands, shopping? N  Preparing Food and eating ? N  Using the Toilet? N  In the past six months, have you accidently leaked urine? N  Do you have problems with loss of bowel control? N  Managing your Medications? Y  Comment Daughter handles meds  Managing your Finances? N  Housekeeping or managing your Housekeeping? N  Some recent data might be hidden    Patient  Education/ Literacy How often do you need to have someone help you when you read instructions, pamphlets, or other written materials from your doctor or pharmacy?: 1 - Never What is the last grade level you completed in school?: 12th grade  Exercise Current Exercise Habits: The patient does not participate in regular exercise at present, Exercise limited by: orthopedic condition(s)  Diet Patient reports consuming 3 meals a day and 2 snack(s) a day Patient reports that her primary diet is: Regular Patient reports that she does have regular access to food.   Depression Screen PHQ 2/9 Scores 09/14/2018 06/09/2018 03/29/2018 03/10/2018 12/07/2017 09/04/2017 06/04/2017  PHQ - 2 Score 0 0 0 0 0 0 0  PHQ- 9 Score - - - - - - -     Fall Risk Fall Risk  03/31/2019 09/14/2018 06/09/2018 03/29/2018 03/29/2018  Falls in the past year? 0 1 1 - 1  Number falls in past yr: - 0 0 - 0  Comment - Laceration on arm. - - -  Injury with Fall? - - 0 (No Data) 1  Comment - - - laceration to arm -  Risk Factor Category  - - - - -  Risk for fall due to : - - - - Impaired balance/gait  Follow up - - - Education provided;Falls prevention discussed -     Objective:  Anna Dickerson seemed alert and oriented and she participated appropriately during our telephone visit.  Blood Pressure Weight BMI  BP Readings from Last 3 Encounters:  10/21/18 (!) 161/67  09/14/18 (!) 173/82  06/09/18 (!) 150/69   Wt Readings from Last 3 Encounters:  10/21/18 233 lb 14.4 oz (106.1 kg)  09/14/18 226 lb (102.5 kg)  06/09/18 225 lb (102.1 kg)   BMI Readings from Last 1 Encounters:  10/21/18 40.15 kg/m    *Unable to obtain current vital signs, weight, and BMI due to telephone visit type  Hearing/Vision  . Niralya did  seem to have difficulty with hearing/understanding during the telephone conversation . Reports that she has had a formal eye exam by an eye care professional within  the past year . Reports that she has had a  formal hearing evaluation within the past year *Unable to fully assess hearing and vision during telephone visit type  Cognitive Function: No flowsheet data found. (Normal:0-7, Significant for Dysfunction: >8)  Normal Cognitive Function Screening: Yes   Immunization & Health Maintenance Record Immunization History  Administered Date(s) Administered  . Influenza, High Dose Seasonal PF 01/08/2017, 12/07/2017  . Influenza,inj,Quad PF,6+ Mos 02/01/2014, 12/03/2015  . Pneumococcal Conjugate-13 01/08/2017  . Pneumococcal Polysaccharide-23 03/23/2017  . Td 05/13/2016  . Tdap 05/13/2016  . Zoster 04/27/2012  . Zoster Recombinat (Shingrix) 05/13/2016, 03/23/2017    Health Maintenance  Topic Date Due  . FOOT EXAM  07/14/2019 (Originally 09/05/2018)  . HEMOGLOBIN A1C  07/14/2019 (Originally 12/10/2018)  . OPHTHALMOLOGY EXAM  01/14/2020  . DEXA SCAN  03/11/2020  . TETANUS/TDAP  05/14/2026  . PNA vac Low Risk Adult  Completed       Assessment  This is a routine wellness examination for Anna Dickerson.  Health Maintenance: Due or Overdue There are no preventive care reminders to display for this patient.  Anna Dickerson does not need a referral for Community Assistance: Care Management:   no Social Work:    no Prescription Assistance:  no Nutrition/Diabetes Education:  no   Plan:  Personalized Goals Goals Addressed            This Visit's Progress   . DIET - EAT MORE FRUITS AND VEGETABLES        Personalized Health Maintenance & Screening Recommendations  Patient is due for a HgB A1C and also a foot exam  Lung Cancer Screening Recommended: no (Low Dose CT Chest recommended if Age 52-80 years, 30 pack-year currently smoking OR have quit w/in past 15 years) Hepatitis C Screening recommended: no HIV Screening recommended: no  Advanced Directives: Written information was not prepared per patient's request.  Referrals & Orders No orders of the defined types  were placed in this encounter.   Follow-up Plan . Follow-up with Dettinger, Fransisca Kaufmann, MD as planned . Patient needs to return to office for next follow up for bloodwork     I have personally reviewed and noted the following in the patient's chart:   . Medical and social history . Use of alcohol, tobacco or illicit drugs  . Current medications and supplements . Functional ability and status . Nutritional status . Physical activity . Advanced directives . List of other physicians . Hospitalizations, surgeries, and ER visits in previous 12 months . Vitals . Screenings to include cognitive, depression, and falls . Referrals and appointments  In addition, I have reviewed and discussed with Anna Dickerson certain preventive protocols, quality metrics, and best practice recommendations. A written personalized care plan for preventive services as well as general preventive health recommendations is available and can be mailed to the patient at her request.      Rolena Infante LPN [id number]

## 2019-04-03 ENCOUNTER — Encounter: Payer: Self-pay | Admitting: Family Medicine

## 2019-04-04 ENCOUNTER — Other Ambulatory Visit: Payer: Self-pay

## 2019-04-12 ENCOUNTER — Encounter: Payer: Self-pay | Admitting: Family Medicine

## 2019-04-15 ENCOUNTER — Other Ambulatory Visit: Payer: Self-pay | Admitting: Family Medicine

## 2019-04-16 ENCOUNTER — Other Ambulatory Visit: Payer: Self-pay | Admitting: Family Medicine

## 2019-04-20 ENCOUNTER — Encounter: Payer: Self-pay | Admitting: Family Medicine

## 2019-04-25 ENCOUNTER — Encounter: Payer: Self-pay | Admitting: Family Medicine

## 2019-04-25 ENCOUNTER — Other Ambulatory Visit: Payer: Self-pay

## 2019-04-25 ENCOUNTER — Ambulatory Visit (INDEPENDENT_AMBULATORY_CARE_PROVIDER_SITE_OTHER): Payer: Medicare PPO | Admitting: Family Medicine

## 2019-04-25 VITALS — BP 155/68 | HR 79 | Temp 97.7°F | Ht 64.0 in | Wt 234.0 lb

## 2019-04-25 DIAGNOSIS — D631 Anemia in chronic kidney disease: Secondary | ICD-10-CM | POA: Diagnosis not present

## 2019-04-25 DIAGNOSIS — I129 Hypertensive chronic kidney disease with stage 1 through stage 4 chronic kidney disease, or unspecified chronic kidney disease: Secondary | ICD-10-CM | POA: Diagnosis not present

## 2019-04-25 DIAGNOSIS — F32A Depression, unspecified: Secondary | ICD-10-CM

## 2019-04-25 DIAGNOSIS — F419 Anxiety disorder, unspecified: Secondary | ICD-10-CM

## 2019-04-25 DIAGNOSIS — R809 Proteinuria, unspecified: Secondary | ICD-10-CM | POA: Diagnosis not present

## 2019-04-25 DIAGNOSIS — F339 Major depressive disorder, recurrent, unspecified: Secondary | ICD-10-CM | POA: Diagnosis not present

## 2019-04-25 DIAGNOSIS — E1140 Type 2 diabetes mellitus with diabetic neuropathy, unspecified: Secondary | ICD-10-CM

## 2019-04-25 DIAGNOSIS — E1139 Type 2 diabetes mellitus with other diabetic ophthalmic complication: Secondary | ICD-10-CM

## 2019-04-25 DIAGNOSIS — K5909 Other constipation: Secondary | ICD-10-CM | POA: Diagnosis not present

## 2019-04-25 DIAGNOSIS — E1129 Type 2 diabetes mellitus with other diabetic kidney complication: Secondary | ICD-10-CM | POA: Diagnosis not present

## 2019-04-25 DIAGNOSIS — E1122 Type 2 diabetes mellitus with diabetic chronic kidney disease: Secondary | ICD-10-CM | POA: Diagnosis not present

## 2019-04-25 DIAGNOSIS — E1159 Type 2 diabetes mellitus with other circulatory complications: Secondary | ICD-10-CM

## 2019-04-25 DIAGNOSIS — I152 Hypertension secondary to endocrine disorders: Secondary | ICD-10-CM

## 2019-04-25 DIAGNOSIS — F329 Major depressive disorder, single episode, unspecified: Secondary | ICD-10-CM

## 2019-04-25 DIAGNOSIS — N183 Chronic kidney disease, stage 3 unspecified: Secondary | ICD-10-CM

## 2019-04-25 DIAGNOSIS — N185 Chronic kidney disease, stage 5: Secondary | ICD-10-CM | POA: Diagnosis not present

## 2019-04-25 DIAGNOSIS — I5032 Chronic diastolic (congestive) heart failure: Secondary | ICD-10-CM | POA: Diagnosis not present

## 2019-04-25 DIAGNOSIS — N189 Chronic kidney disease, unspecified: Secondary | ICD-10-CM | POA: Diagnosis not present

## 2019-04-25 DIAGNOSIS — I10 Essential (primary) hypertension: Secondary | ICD-10-CM | POA: Diagnosis not present

## 2019-04-25 DIAGNOSIS — E211 Secondary hyperparathyroidism, not elsewhere classified: Secondary | ICD-10-CM | POA: Diagnosis not present

## 2019-04-25 LAB — BAYER DCA HB A1C WAIVED: HB A1C (BAYER DCA - WAIVED): 7.4 % — ABNORMAL HIGH (ref <7.0)

## 2019-04-25 MED ORDER — LINACLOTIDE 72 MCG PO CAPS: 72.0000 ug | ORAL_CAPSULE | Freq: Every day | ORAL | 5 refills | Status: DC

## 2019-04-25 MED ORDER — LORAZEPAM 0.5 MG PO TABS: 0.5000 mg | ORAL_TABLET | Freq: Every day | ORAL | 2 refills | Status: DC

## 2019-04-25 MED ORDER — FLUTICASONE PROPIONATE 50 MCG/ACT NA SUSP: 1.0000 | Freq: Two times a day (BID) | NASAL | 6 refills | Status: AC | PRN

## 2019-04-25 NOTE — Progress Notes (Signed)
BP (!) 155/68   Pulse 79   Temp 97.7 F (36.5 C) (Temporal)   Ht 5\' 4"  (1.626 m)   Wt 234 lb (106.1 kg)   BMI 40.17 kg/m    Subjective:   Patient ID: Anna Dickerson, female    DOB: 18-Aug-1929, 86 y.o.   MRN: [id number]  HPI: Anna Dickerson is a 86 y.o. female presenting on 04/25/2019 for Medical Management of Chronic Issues   HPI Type 2 diabetes mellitus Patient comes in today for recheck of his diabetes. Patient has been currently taking Trulicity 0.75 weekly. Patient is not currently on an ACE inhibitor/ARB. Patient has not seen an ophthalmologist this year. Patient denies any issues with their feet.  Stage IV CKD, managed by nephrology  Hypertension Patient is currently on carvedilol and amlodipine and clonidine and hydralazine, and their blood pressure today is 155/68. Patient denies any lightheadedness or dizziness. Patient denies headaches, blurred vision, chest pains, shortness of breath, or weakness. Denies any side effects from medication and is content with current medication.   Hyperlipidemia Patient is coming in for recheck of his hyperlipidemia. The patient is currently taking atorvastatin. They deny any issues with myalgias or history of liver damage from it. They deny any focal numbness or weakness or chest pain.   Depression and anxiety Patient is coming in for depression anxiety and currently uses lorazepam and Effexor for anxiety depression feels like is working well for her.  She denies any suicidal ideations or thoughts of hurting self. Depression screen [facility] 2/9 04/25/2019 09/14/2018 06/09/2018 03/29/2018 03/10/2018  Decreased Interest 0 0 0 0 0  Down, Depressed, Hopeless 0 0 0 0 0  PHQ - 2 Score 0 0 0 0 0  Altered sleeping 0 - - - -  Tired, decreased energy 1 - - - -  Change in appetite 1 - - - -  Feeling bad or failure about yourself  1 - - - -  Trouble concentrating 0 - - - -  Moving slowly or fidgety/restless 0 - - - -  Suicidal thoughts - - - - -    PHQ-9 Score 3 - - - -  Difficult doing work/chores Somewhat difficult - - - -  Some recent data might be hidden    Current rx-lorazepam 0.5 mg nightly. # meds rx-30 Effectiveness of current meds-works well Adverse reactions form meds-none  Pill count performed-No Last drug screen -06/16/2018 ( high risk q58m, moderate risk q44m, low risk yearly ) Urine drug screen today- Yes Was the NCCSR reviewed-yes  If yes were their any concerning findings? -None  No flowsheet data found.   Controlled substance contract signed on: Today Relevant past medical, surgical, family and social history reviewed and updated as indicated. Interim medical history since our last visit reviewed. Allergies and medications reviewed and updated.  Review of Systems  Constitutional: Negative for chills and fever.  Eyes: Negative for visual disturbance.  Respiratory: Negative for chest tightness and shortness of breath.   Cardiovascular: Negative for chest pain and leg swelling.  Musculoskeletal: Negative for back pain and gait problem.  Skin: Negative for rash.  Neurological: Negative for light-headedness and headaches.  Psychiatric/Behavioral: Negative for agitation and behavioral problems.  All other systems reviewed and are negative.   Per HPI unless specifically indicated above   Allergies as of 04/25/2019      Reactions   Sulfa Antibiotics    Mouth ulcers      Medication List  Accurate as of April 25, 2019  2:20 PM. If you have any questions, ask your nurse or doctor.        STOP taking these medications   FreeStyle Libre 14 Day Reader Vonna Drafts by: Elige Radon Maryem Shuffler, MD   FreeStyle Libre 14 Day Sensor Misc Stopped by: Elige Radon Cyara Devoto, MD     TAKE these medications   Accu-Chek Aviva Plus test strip Generic drug: glucose blood CHECK BLOOD SUGAR UP TO 3 TIMES A DAY AS DIRECTED   Accu-Chek Aviva Plus w/Device Kit Check BS up to three times daily   Accu-Chek Softclix  Lancets lancets CHECK BLOOD SUGAR UP TO 3 TIMES A DAY AS DIRECTED   amLODipine 10 MG tablet Commonly known as: NORVASC TAKE 1 TABLET ONCE DAILY   aspirin 81 MG tablet Take 81 mg by mouth daily.   atorvastatin 20 MG tablet Commonly known as: LIPITOR Take 1 tablet (20 mg total) by mouth daily at 6 PM.   carvedilol 25 MG tablet Commonly known as: COREG Take 1 tablet (25 mg total) by mouth 2 (two) times daily with a meal.   cloNIDine 0.1 MG tablet Commonly known as: CATAPRES Take 1 tablet (0.1 mg total) by mouth 3 (three) times daily.   fluticasone 50 MCG/ACT nasal spray Commonly known as: FLONASE Place 1 spray into both nostrils 2 (two) times daily as needed for allergies or rhinitis.   furosemide 20 MG tablet Commonly known as: LASIX TAKE 1 TABLET DAILY   hydrALAZINE 25 MG tablet Commonly known as: APRESOLINE Take 50 mg by mouth in the morning and at bedtime.   LORazepam 0.5 MG tablet Commonly known as: ATIVAN Take 1 tablet (0.5 mg total) by mouth at bedtime.   melatonin 5 MG Tabs Take 1.5 tablets (7.5 mg total) by mouth at bedtime.   Melatonin/Vitamin B-6 Ex St 5-1 MG Tabs Generic drug: Melatonin-Pyridoxine TAKE ONE TABLET AT BEDTIME   PreserVision AREDS 2 Caps Take 2 capsules by mouth 2 (two) times daily.   Trulicity 0.75 MG/0.5ML Sopn Generic drug: Dulaglutide INJECT 0.75 MG ONCE A WEEK   venlafaxine XR 150 MG 24 hr capsule Commonly known as: EFFEXOR-XR Take 1 capsule (150 mg total) by mouth daily with breakfast.   Vitamin D (Ergocalciferol) 1.25 MG (50000 UNIT) Caps capsule Commonly known as: DRISDOL TAKE 1 CAPSULE ONCE A WEEK        Objective:   BP (!) 155/68   Pulse 79   Temp 97.7 F (36.5 C) (Temporal)   Ht 5\' 4"  (1.626 m)   Wt 234 lb (106.1 kg)   BMI 40.17 kg/m   Wt Readings from Last 3 Encounters:  04/25/19 234 lb (106.1 kg)  10/21/18 233 lb 14.4 oz (106.1 kg)  09/14/18 226 lb (102.5 kg)    Physical Exam Vitals and nursing note  reviewed.  Constitutional:      General: She is not in acute distress.    Appearance: She is well-developed. She is not diaphoretic.  Eyes:     Conjunctiva/sclera: Conjunctivae normal.  Cardiovascular:     Rate and Rhythm: Normal rate and regular rhythm.     Heart sounds: Normal heart sounds. No murmur.  Pulmonary:     Effort: Pulmonary effort is normal. No respiratory distress.     Breath sounds: Normal breath sounds. No wheezing.  Musculoskeletal:        General: No tenderness. Normal range of motion.  Skin:    General: Skin is warm and dry.  Findings: No rash.  Neurological:     Mental Status: She is alert and oriented to person, place, and time.     Coordination: Coordination normal.  Psychiatric:        Behavior: Behavior normal.       Assessment & Plan:   Problem List Items Addressed This Visit      Cardiovascular and Mediastinum   Hypertension associated with diabetes (HCC)   Relevant Orders   CMP14+EGFR     Endocrine   DM (diabetes mellitus) (HCC)   Relevant Orders   Bayer DCA Hb A1c Waived   CMP14+EGFR   Lipid panel   CKD stage 3 due to type 2 diabetes mellitus (HCC)   Type 2 diabetes mellitus with diabetic neuropathy, without long-term current use of insulin (HCC) - Primary   Relevant Orders   Bayer DCA Hb A1c Waived   CMP14+EGFR     Other   Depression, recurrent (HCC)   Relevant Medications   LORazepam (ATIVAN) 0.5 MG tablet    Other Visit Diagnoses    Anxiety and depression       Relevant Medications   LORazepam (ATIVAN) 0.5 MG tablet   Other Relevant Orders   ToxASSURE Select 13 (MW), Urine   CBC with Differential/Platelet   Constipation, chronic       Relevant Medications   linaclotide (LINZESS) 72 MCG capsule      Continue current medication, will try Linzess for her constipation that is chronic.  Her A1c is up to 7.4, no medication changes focus on diet and lifestyle modification. Follow up plan: No follow-ups on  file.  Counseling provided for all of the vaccine components Orders Placed This Encounter  Procedures  . Bayer DCA Hb A1c Waived  . ToxASSURE Select 13 (MW), Urine    Arville Care, MD [facility] 04/25/2019, 2:20 PM

## 2019-04-27 LAB — CBC WITH DIFFERENTIAL/PLATELET
Basophils Absolute: 0 10*3/uL (ref 0.0–0.2)
Basos: 0 %
EOS (ABSOLUTE): 0.5 10*3/uL — ABNORMAL HIGH (ref 0.0–0.4)
Eos: 5 %
Hematocrit: 30.1 % — ABNORMAL LOW (ref 34.0–46.6)
Hemoglobin: 9.9 g/dL — ABNORMAL LOW (ref 11.1–15.9)
Immature Grans (Abs): 0.1 10*3/uL (ref 0.0–0.1)
Immature Granulocytes: 1 %
Lymphocytes Absolute: 2.5 10*3/uL (ref 0.7–3.1)
Lymphs: 25 %
MCH: 28.9 pg (ref 26.6–33.0)
MCHC: 32.9 g/dL (ref 31.5–35.7)
MCV: 88 fL (ref 79–97)
Monocytes Absolute: 0.8 10*3/uL (ref 0.1–0.9)
Monocytes: 8 %
Neutrophils Absolute: 6 10*3/uL (ref 1.4–7.0)
Neutrophils: 61 %
Platelets: 185 10*3/uL (ref 150–450)
RBC: 3.43 x10E6/uL — ABNORMAL LOW (ref 3.77–5.28)
RDW: 14.2 % (ref 11.7–15.4)
WBC: 9.8 10*3/uL (ref 3.4–10.8)

## 2019-04-27 LAB — LIPID PANEL
Chol/HDL Ratio: 3.3 ratio (ref 0.0–4.4)
Cholesterol, Total: 123 mg/dL (ref 100–199)
HDL: 37 mg/dL — ABNORMAL LOW (ref >39)
LDL Chol Calc (NIH): 52 mg/dL (ref 0–99)
Triglycerides: 209 mg/dL — ABNORMAL HIGH (ref 0–149)
VLDL Cholesterol Cal: 34 mg/dL (ref 5–40)

## 2019-04-27 LAB — CMP14+EGFR
ALT: 9 IU/L (ref 0–32)
AST: 15 IU/L (ref 0–40)
Albumin/Globulin Ratio: 1.4 (ref 1.2–2.2)
Albumin: 4.1 g/dL (ref 3.6–4.6)
Alkaline Phosphatase: 84 IU/L (ref 39–117)
BUN/Creatinine Ratio: 15 (ref 12–28)
BUN: 39 mg/dL — ABNORMAL HIGH (ref 8–27)
Bilirubin Total: <0.2 mg/dL (ref 0.0–1.2)
CO2: 24 mmol/L (ref 20–29)
Calcium: 9 mg/dL (ref 8.7–10.3)
Chloride: 104 mmol/L (ref 96–106)
Creatinine, Ser: 2.62 mg/dL — ABNORMAL HIGH (ref 0.57–1.00)
GFR calc Af Amer: 18 mL/min/{1.73_m2} — ABNORMAL LOW (ref >59)
GFR calc non Af Amer: 16 mL/min/{1.73_m2} — ABNORMAL LOW (ref >59)
Globulin, Total: 3 g/dL (ref 1.5–4.5)
Glucose: 115 mg/dL — ABNORMAL HIGH (ref 65–99)
Potassium: 3.6 mmol/L (ref 3.5–5.2)
Sodium: 144 mmol/L (ref 134–144)
Total Protein: 7.1 g/dL (ref 6.0–8.5)

## 2019-04-27 LAB — TOXASSURE SELECT 13 (MW), URINE

## 2019-04-29 ENCOUNTER — Other Ambulatory Visit: Payer: Self-pay | Admitting: *Deleted

## 2019-04-29 ENCOUNTER — Other Ambulatory Visit: Payer: Self-pay | Admitting: Family Medicine

## 2019-04-29 DIAGNOSIS — H35373 Puckering of macula, bilateral: Secondary | ICD-10-CM | POA: Diagnosis not present

## 2019-04-29 DIAGNOSIS — E1122 Type 2 diabetes mellitus with diabetic chronic kidney disease: Secondary | ICD-10-CM

## 2019-04-29 DIAGNOSIS — E785 Hyperlipidemia, unspecified: Secondary | ICD-10-CM

## 2019-04-29 DIAGNOSIS — H353211 Exudative age-related macular degeneration, right eye, with active choroidal neovascularization: Secondary | ICD-10-CM | POA: Diagnosis not present

## 2019-04-29 DIAGNOSIS — H43813 Vitreous degeneration, bilateral: Secondary | ICD-10-CM | POA: Diagnosis not present

## 2019-04-29 DIAGNOSIS — N183 Chronic kidney disease, stage 3 unspecified: Secondary | ICD-10-CM

## 2019-04-29 DIAGNOSIS — H353123 Nonexudative age-related macular degeneration, left eye, advanced atrophic without subfoveal involvement: Secondary | ICD-10-CM | POA: Diagnosis not present

## 2019-05-06 ENCOUNTER — Encounter: Payer: Self-pay | Admitting: Vascular Surgery

## 2019-05-06 ENCOUNTER — Ambulatory Visit (INDEPENDENT_AMBULATORY_CARE_PROVIDER_SITE_OTHER): Payer: Medicare PPO | Admitting: Vascular Surgery

## 2019-05-06 ENCOUNTER — Ambulatory Visit (INDEPENDENT_AMBULATORY_CARE_PROVIDER_SITE_OTHER)
Admission: RE | Admit: 2019-05-06 | Discharge: 2019-05-06 | Disposition: A | Discharge status: Home / Self Care | Payer: Medicare PPO | Source: Ambulatory Visit | Attending: Vascular Surgery | Admitting: Vascular Surgery

## 2019-05-06 ENCOUNTER — Ambulatory Visit (HOSPITAL_COMMUNITY)
Admission: RE | Admit: 2019-05-06 | Discharge: 2019-05-06 | Disposition: A | Discharge status: Home / Self Care | Payer: Medicare PPO | Source: Ambulatory Visit | Attending: Vascular Surgery | Admitting: Vascular Surgery

## 2019-05-06 ENCOUNTER — Other Ambulatory Visit: Payer: Self-pay

## 2019-05-06 VITALS — BP 146/78 | HR 81 | Temp 97.6°F | Resp 20 | Ht 64.0 in | Wt 234.0 lb

## 2019-05-06 DIAGNOSIS — E1122 Type 2 diabetes mellitus with diabetic chronic kidney disease: Secondary | ICD-10-CM

## 2019-05-06 DIAGNOSIS — N185 Chronic kidney disease, stage 5: Secondary | ICD-10-CM | POA: Diagnosis not present

## 2019-05-06 DIAGNOSIS — N183 Chronic kidney disease, stage 3 unspecified: Secondary | ICD-10-CM

## 2019-05-06 NOTE — Progress Notes (Signed)
Patient ID: Anna Dickerson, female   DOB: 10-22-1929, 86 y.o.   MRN: [id number]  Reason for Consult: New Patient (Initial Visit)   Referred by Dettinger, Fransisca Kaufmann, MD  Subjective:     HPI:  Anna Dickerson is a 86 y.o. female very healthy appearing now with chronic kidney disease stage V secondary to hypertension diabetes.  She has never been on dialysis.  She has never had upper extremity surgery.  She has had breast surgery where they took 3 lymph nodes she believes this was approximately 9 years ago.  She has no left upper extremity swelling.  She is right-hand dominant.  She does take aspirin does not take any blood thinners.  She remains very active with a very active social life and is hoping to do home dialysis.  Past Medical History:  Diagnosis Date  . B12 deficiency 02/08/2015  . Cancer [facility]) [id number]   breast  . Chronic kidney disease   . Diabetes mellitus   . DM (diabetes mellitus) ([place]) 12/31/2010  . HTN (hypertension) 12/31/2010  . Hypertension   . Iron deficiency 02/24/2012  . Macular degeneration 12/31/2010  . Macular degeneration, left eye   . OAB (overactive bladder)   . Obesity 12/31/2010  . Stage I left-sided Breast cancer 12/31/2010   Family History  Problem Relation Age of Onset  . Cancer Mother   . Cancer Sister   . Heart attack Brother   . Arthritis Sister   . Anxiety disorder Brother   . Arthritis Sister    Past Surgical History:  Procedure Laterality Date  . bilateral cataract with intraocular lens implant    . Bladder tacked-up    . BREAST SURGERY  left    . REPLACEMENT TOTAL KNEE BILATERAL Bilateral    TKR were ar different times about 2 yeras apart.  Marland Kitchen SKIN CANCER EXCISION     squamous cell right ear and calf right leg    Short Social History:  Social History   Tobacco Use  . Smoking status: Never Smoker  . Smokeless tobacco: Never Used  Substance Use Topics  . Alcohol use: No    Allergies  Allergen Reactions  . Sulfa  Antibiotics     Mouth ulcers    Current Outpatient Medications  Medication Sig Dispense Refill  . ACCU-CHEK AVIVA PLUS test strip CHECK BLOOD SUGAR UP TO 3 TIMES A DAY AS DIRECTED 300 each 3  . ACCU-CHEK SOFTCLIX LANCETS lancets CHECK BLOOD SUGAR UP TO 3 TIMES A DAY AS DIRECTED 300 each 3  . amLODipine (NORVASC) 10 MG tablet TAKE 1 TABLET ONCE DAILY 90 tablet 0  . aspirin 81 MG tablet Take 81 mg by mouth daily.      Marland Kitchen atorvastatin (LIPITOR) 20 MG tablet Take 1 tablet (20 mg total) by mouth daily at 6 PM. 90 tablet 1  . Blood Glucose Monitoring Suppl (ACCU-CHEK AVIVA PLUS) w/Device KIT Check BS up to three times daily 1 kit 0  . carvedilol (COREG) 25 MG tablet Take 1 tablet (25 mg total) by mouth 2 (two) times daily with a meal. 90 tablet 3  . cloNIDine (CATAPRES) 0.1 MG tablet Take 1 tablet (0.1 mg total) by mouth 3 (three) times daily. 90 tablet 3  . fluticasone (FLONASE) 50 MCG/ACT nasal spray Place 1 spray into both nostrils 2 (two) times daily as needed for allergies or rhinitis. 16 g 6  . furosemide (LASIX) 20 MG tablet TAKE 1 TABLET DAILY 90 tablet 0  .  hydrALAZINE (APRESOLINE) 25 MG tablet Take 50 mg by mouth in the morning and at bedtime.    Marland Kitchen linaclotide (LINZESS) 72 MCG capsule Take 1 capsule (72 mcg total) by mouth daily before breakfast. 30 capsule 5  . LORazepam (ATIVAN) 0.5 MG tablet Take 1 tablet (0.5 mg total) by mouth at bedtime. 30 tablet 2  . Melatonin 5 MG TABS Take 1.5 tablets (7.5 mg total) by mouth at bedtime. 30 tablet 6  . MELATONIN/VITAMIN B-6 EX ST 5-1 MG TABS TAKE ONE TABLET AT BEDTIME 30 tablet 0  . Multiple Vitamins-Minerals (PRESERVISION AREDS 2) CAPS Take 2 capsules by mouth 2 (two) times daily.    Marland Kitchen tobramycin (TOBREX) 0.3 % ophthalmic solution SMARTSIG:1 Drop(s) Right Eye 8 Times Daily    . TRULICITY 1.96 QI/2.9NL SOPN INJECT 0.75 MG ONCE A WEEK 2 mL 0  . venlafaxine XR (EFFEXOR-XR) 150 MG 24 hr capsule Take 1 capsule (150 mg total) by mouth daily with  breakfast. 90 capsule 3  . Vitamin D, Ergocalciferol, (DRISDOL) 50000 units CAPS capsule TAKE 1 CAPSULE ONCE A WEEK 4 capsule 0   No current facility-administered medications for this visit.    Review of Systems  Constitutional:  Constitutional negative. HENT: HENT negative.  Eyes: Eyes negative.  Respiratory: Respiratory negative.  Cardiovascular: Cardiovascular negative.  GI: Gastrointestinal negative.  Musculoskeletal: Musculoskeletal negative.  Skin: Skin negative.  Neurological: Neurological negative. Hematologic: Hematologic/lymphatic negative.  Psychiatric: Psychiatric negative.        Objective:  Objective   Vitals:   05/06/19 1112  BP: (!) 146/78  Pulse: 81  Resp: 20  Temp: 97.6 F (36.4 C)  SpO2: 95%  Weight: 234 lb (106.1 kg)  Height: '5\' 4"'$  (1.626 m)   Body mass index is 40.17 kg/m.  Physical Exam Constitutional:      Appearance: Normal appearance.  HENT:     Head: Normocephalic.     Nose:     Comments: Mask in place Cardiovascular:     Rate and Rhythm: Normal rate.     Pulses: Normal pulses.  Pulmonary:     Effort: Pulmonary effort is normal.  Abdominal:     General: Abdomen is flat.     Palpations: Abdomen is soft.  Skin:    Capillary Refill: Capillary refill takes less than 2 seconds.  Neurological:     General: No focal deficit present.     Mental Status: She is alert.  Psychiatric:        Mood and Affect: Mood normal.        Behavior: Behavior normal.        Thought Content: Thought content normal.        Judgment: Judgment normal.     Data: I have independent interpreted her vein mapping which demonstrates marginal vein in both the bilateral cephalic and basilic veins for dialysis access creation.  I have independent interpreted her bilateral upper extremity arterial duplex.  This demonstrates 0.56 and meter artery on the right which is triphasic and 0.45 cm brachial artery at the left which is triphasic.  Both bifurcations are at  the antecubital fossa.     Assessment/Plan:     86 year old female with chronic kidney disease stage V.  She is now indicated for dialysis access.  Unfortunately does not have any suitable vein by ultrasound today.  We will plan for left upper extremity AV graft.  We discussed the risk benefits alternatives specifically with her previous history of lymph node harvesting in the left  axilla that she could have some left upper extremity swelling.  She demonstrates good understanding we will get her scheduled today.  She can continue aspirin throughout the periprocedural time.     Waynetta Sandy MD [facility]

## 2019-05-12 ENCOUNTER — Other Ambulatory Visit: Payer: Self-pay | Admitting: Family Medicine

## 2019-05-13 ENCOUNTER — Other Ambulatory Visit: Payer: Self-pay | Admitting: *Deleted

## 2019-05-13 DIAGNOSIS — D631 Anemia in chronic kidney disease: Secondary | ICD-10-CM | POA: Diagnosis not present

## 2019-05-13 DIAGNOSIS — N185 Chronic kidney disease, stage 5: Secondary | ICD-10-CM | POA: Diagnosis not present

## 2019-05-13 DIAGNOSIS — I5032 Chronic diastolic (congestive) heart failure: Secondary | ICD-10-CM | POA: Diagnosis not present

## 2019-05-13 DIAGNOSIS — I129 Hypertensive chronic kidney disease with stage 1 through stage 4 chronic kidney disease, or unspecified chronic kidney disease: Secondary | ICD-10-CM | POA: Diagnosis not present

## 2019-05-13 DIAGNOSIS — E1129 Type 2 diabetes mellitus with other diabetic kidney complication: Secondary | ICD-10-CM | POA: Diagnosis not present

## 2019-05-13 DIAGNOSIS — E559 Vitamin D deficiency, unspecified: Secondary | ICD-10-CM | POA: Diagnosis not present

## 2019-05-13 DIAGNOSIS — R809 Proteinuria, unspecified: Secondary | ICD-10-CM | POA: Diagnosis not present

## 2019-05-13 DIAGNOSIS — E1122 Type 2 diabetes mellitus with diabetic chronic kidney disease: Secondary | ICD-10-CM | POA: Diagnosis not present

## 2019-05-13 DIAGNOSIS — E211 Secondary hyperparathyroidism, not elsewhere classified: Secondary | ICD-10-CM | POA: Diagnosis not present

## 2019-05-13 MED ORDER — FUROSEMIDE 20 MG PO TABS: 40.0000 mg | ORAL_TABLET | ORAL | 0 refills | Status: DC

## 2019-05-16 ENCOUNTER — Other Ambulatory Visit: Payer: Self-pay

## 2019-05-16 ENCOUNTER — Other Ambulatory Visit (HOSPITAL_COMMUNITY)
Admission: RE | Admit: 2019-05-16 | Discharge: 2019-05-16 | Disposition: A | Discharge status: Home / Self Care | Payer: Medicare PPO | Source: Ambulatory Visit | Attending: Vascular Surgery | Admitting: Vascular Surgery

## 2019-05-16 ENCOUNTER — Encounter: Payer: Self-pay | Admitting: Family Medicine

## 2019-05-16 DIAGNOSIS — E1140 Type 2 diabetes mellitus with diabetic neuropathy, unspecified: Secondary | ICD-10-CM

## 2019-05-17 ENCOUNTER — Encounter (HOSPITAL_COMMUNITY): Admission: RE | Payer: Self-pay | Source: Home / Self Care

## 2019-05-17 ENCOUNTER — Ambulatory Visit (HOSPITAL_COMMUNITY): Admission: RE | Admit: 2019-05-17 | Payer: Medicare PPO | Source: Home / Self Care | Admitting: Vascular Surgery

## 2019-05-17 SURGERY — ARTERIOVENOUS (AV) FISTULA CREATION
Anesthesia: Monitor Anesthesia Care | Laterality: Left

## 2019-05-19 MED ORDER — LIVALO 2 MG PO TABS: 2.0000 mg | ORAL_TABLET | Freq: Every day | ORAL | 3 refills | Status: DC

## 2019-05-20 ENCOUNTER — Telehealth: Payer: Self-pay

## 2019-05-20 NOTE — Telephone Encounter (Signed)
Prior Auth for Livalo 2mg - in process   BIN- [id number] GRP- [id number] ID- [id number]

## 2019-05-23 NOTE — Telephone Encounter (Signed)
PA for Livalo denied. If you would like [place] to submit appeal pease provide specific, detailed clinical rationale to support your request for redetermination.

## 2019-05-24 NOTE — Telephone Encounter (Signed)
The Prior Auth was denied. Do you want to try to appeal?

## 2019-05-24 NOTE — Telephone Encounter (Signed)
I would go ahead and try for the prior authorization, we can put the reasoning down as statin induced myalgias, failed atorvastatin treatment

## 2019-05-25 NOTE — Telephone Encounter (Signed)
I would just say the same thing that we said previously that she has failed atorvastatin with myalgia

## 2019-05-25 NOTE — Telephone Encounter (Signed)
PA appeal needs a letter of medical necessity. What would you like the letter to state?

## 2019-05-25 NOTE — Telephone Encounter (Signed)
Yes I think it would be a good idea to try and appeal, if you think there is a chance we can actually win it.

## 2019-05-25 NOTE — Telephone Encounter (Signed)
Appeal in process for Livalo- Key [id number]  Your information has been submitted to [facility]. [employer] will review the request and will issue a decision, typically within 3-7 days from your submission. You can check the updated outcome later by reopening this request.  If [employer] has not responded in 3-7 days or if you have any questions about your ePA request, please contact [employer] at [phone]. If you think there may be a problem with your PA request, use our live chat feature at the bottom right.  For [place] requests, please call [phone].

## 2019-05-27 ENCOUNTER — Other Ambulatory Visit: Payer: Self-pay | Admitting: *Deleted

## 2019-05-27 ENCOUNTER — Telehealth: Payer: Self-pay | Admitting: *Deleted

## 2019-05-27 MED ORDER — PRAVASTATIN SODIUM 20 MG PO TABS: 20.0000 mg | ORAL_TABLET | Freq: Every day | ORAL | 3 refills | Status: DC

## 2019-05-27 NOTE — Telephone Encounter (Signed)
Daughter aware of appeal for livalo.

## 2019-05-27 NOTE — Telephone Encounter (Signed)
Livalo 2MG tablets- APPEAL DENIED  Key: [id number]   PA Case ID: [id number]   Before the requested drug can be approved, step therapy is required (trying other medicines first before stepping up to a drug that costs more). The following coverage rules must be met before the requested drug can be approved: has tried at least TWO of the following generic statins: atorvastatin, lovastatin, pravastatin, simvastatin, or rosuvastatin. The information we have about your case says you have already tried atorvastatin. Please talk to your prescriber about trying one of the other required drugs lovastatin, pravastatin, simvastatin, or rosuvastatin. Based on available information, Anna Dickerson has determined you do not meet these requirements. We encourage you to ask your health care provider to send [place] new information if you do meet the requirements, or tell us why these coverage rules should not apply to you. This determination was based on the [place] Statin Products Coverage Policy.

## 2019-05-27 NOTE — Telephone Encounter (Signed)
Please let the patient know that I sent in pravastatin for her to replace the Livalo, the insurance company would not cover the Livalo until we try at least one other statin, stop pravastatin is also known to be less likely to cause muscle aches.

## 2019-05-27 NOTE — Telephone Encounter (Signed)
Patient's daughter aware, Pravachol script is ready.  Discontinued atorvastatin,(causes leg pain), and livalo,(not covered by insurance).

## 2019-06-03 DIAGNOSIS — L723 Sebaceous cyst: Secondary | ICD-10-CM | POA: Diagnosis not present

## 2019-06-03 DIAGNOSIS — D692 Other nonthrombocytopenic purpura: Secondary | ICD-10-CM | POA: Diagnosis not present

## 2019-06-03 DIAGNOSIS — D225 Melanocytic nevi of trunk: Secondary | ICD-10-CM | POA: Diagnosis not present

## 2019-06-03 DIAGNOSIS — L304 Erythema intertrigo: Secondary | ICD-10-CM | POA: Diagnosis not present

## 2019-06-03 DIAGNOSIS — L2089 Other atopic dermatitis: Secondary | ICD-10-CM | POA: Diagnosis not present

## 2019-06-03 DIAGNOSIS — D2271 Melanocytic nevi of right lower limb, including hip: Secondary | ICD-10-CM | POA: Diagnosis not present

## 2019-06-03 DIAGNOSIS — B078 Other viral warts: Secondary | ICD-10-CM | POA: Diagnosis not present

## 2019-06-03 DIAGNOSIS — L821 Other seborrheic keratosis: Secondary | ICD-10-CM | POA: Diagnosis not present

## 2019-06-03 DIAGNOSIS — Z85828 Personal history of other malignant neoplasm of skin: Secondary | ICD-10-CM | POA: Diagnosis not present

## 2019-06-14 ENCOUNTER — Other Ambulatory Visit: Payer: Self-pay | Admitting: Family Medicine

## 2019-06-17 ENCOUNTER — Other Ambulatory Visit: Payer: Self-pay | Admitting: Family Medicine

## 2019-06-17 DIAGNOSIS — I10 Essential (primary) hypertension: Secondary | ICD-10-CM

## 2019-06-19 ENCOUNTER — Encounter: Payer: Self-pay | Admitting: Family Medicine

## 2019-06-22 ENCOUNTER — Encounter: Payer: Self-pay | Admitting: Family Medicine

## 2019-06-23 MED ORDER — LIVALO 2 MG PO TABS: 2.0000 mg | ORAL_TABLET | Freq: Every day | ORAL | 3 refills | Status: DC

## 2019-07-01 ENCOUNTER — Other Ambulatory Visit: Payer: Self-pay

## 2019-07-01 ENCOUNTER — Other Ambulatory Visit: Payer: Medicare PPO

## 2019-07-01 DIAGNOSIS — E1129 Type 2 diabetes mellitus with other diabetic kidney complication: Secondary | ICD-10-CM | POA: Diagnosis not present

## 2019-07-01 DIAGNOSIS — D631 Anemia in chronic kidney disease: Secondary | ICD-10-CM | POA: Diagnosis not present

## 2019-07-01 DIAGNOSIS — E1122 Type 2 diabetes mellitus with diabetic chronic kidney disease: Secondary | ICD-10-CM | POA: Diagnosis not present

## 2019-07-01 DIAGNOSIS — I129 Hypertensive chronic kidney disease with stage 1 through stage 4 chronic kidney disease, or unspecified chronic kidney disease: Secondary | ICD-10-CM | POA: Diagnosis not present

## 2019-07-01 DIAGNOSIS — N189 Chronic kidney disease, unspecified: Secondary | ICD-10-CM | POA: Diagnosis not present

## 2019-07-01 DIAGNOSIS — N185 Chronic kidney disease, stage 5: Secondary | ICD-10-CM | POA: Diagnosis not present

## 2019-07-01 DIAGNOSIS — I5032 Chronic diastolic (congestive) heart failure: Secondary | ICD-10-CM | POA: Diagnosis not present

## 2019-07-01 DIAGNOSIS — E211 Secondary hyperparathyroidism, not elsewhere classified: Secondary | ICD-10-CM | POA: Diagnosis not present

## 2019-07-01 DIAGNOSIS — R809 Proteinuria, unspecified: Secondary | ICD-10-CM | POA: Diagnosis not present

## 2019-07-04 ENCOUNTER — Other Ambulatory Visit: Payer: Self-pay | Admitting: Family Medicine

## 2019-07-06 ENCOUNTER — Telehealth: Payer: Self-pay | Admitting: Family Medicine

## 2019-07-06 ENCOUNTER — Encounter: Payer: Self-pay | Admitting: Family Medicine

## 2019-07-06 ENCOUNTER — Other Ambulatory Visit: Payer: Self-pay

## 2019-07-06 MED ORDER — ACCU-CHEK AVIVA PLUS W/DEVICE KIT: PACK | 0 refills | Status: AC

## 2019-07-06 NOTE — Telephone Encounter (Signed)
Pt states her glucometer broke and the pharmacy told her to call to see if she can get the [place]. Please advise pt.

## 2019-07-06 NOTE — Telephone Encounter (Signed)
MCR will not cover Libre since pt is not on insulin TID.  If pt pays OOP it will be $75 per month at [facility].  Pt will just continue with same glucometer.  Refill sent to [facility].

## 2019-07-07 DIAGNOSIS — E1129 Type 2 diabetes mellitus with other diabetic kidney complication: Secondary | ICD-10-CM | POA: Diagnosis not present

## 2019-07-07 DIAGNOSIS — I129 Hypertensive chronic kidney disease with stage 1 through stage 4 chronic kidney disease, or unspecified chronic kidney disease: Secondary | ICD-10-CM | POA: Diagnosis not present

## 2019-07-07 DIAGNOSIS — N185 Chronic kidney disease, stage 5: Secondary | ICD-10-CM | POA: Diagnosis not present

## 2019-07-07 DIAGNOSIS — I5032 Chronic diastolic (congestive) heart failure: Secondary | ICD-10-CM | POA: Diagnosis not present

## 2019-07-07 DIAGNOSIS — E211 Secondary hyperparathyroidism, not elsewhere classified: Secondary | ICD-10-CM | POA: Diagnosis not present

## 2019-07-07 DIAGNOSIS — D631 Anemia in chronic kidney disease: Secondary | ICD-10-CM | POA: Diagnosis not present

## 2019-07-07 DIAGNOSIS — E1122 Type 2 diabetes mellitus with diabetic chronic kidney disease: Secondary | ICD-10-CM | POA: Diagnosis not present

## 2019-07-07 DIAGNOSIS — R809 Proteinuria, unspecified: Secondary | ICD-10-CM | POA: Diagnosis not present

## 2019-07-07 DIAGNOSIS — N189 Chronic kidney disease, unspecified: Secondary | ICD-10-CM | POA: Diagnosis not present

## 2019-07-08 ENCOUNTER — Other Ambulatory Visit: Payer: Self-pay

## 2019-07-08 ENCOUNTER — Other Ambulatory Visit: Payer: Medicare PPO

## 2019-07-08 DIAGNOSIS — D631 Anemia in chronic kidney disease: Secondary | ICD-10-CM | POA: Diagnosis not present

## 2019-07-08 DIAGNOSIS — R809 Proteinuria, unspecified: Secondary | ICD-10-CM | POA: Diagnosis not present

## 2019-07-08 DIAGNOSIS — E1122 Type 2 diabetes mellitus with diabetic chronic kidney disease: Secondary | ICD-10-CM | POA: Diagnosis not present

## 2019-07-08 DIAGNOSIS — I129 Hypertensive chronic kidney disease with stage 1 through stage 4 chronic kidney disease, or unspecified chronic kidney disease: Secondary | ICD-10-CM | POA: Diagnosis not present

## 2019-07-08 DIAGNOSIS — N185 Chronic kidney disease, stage 5: Secondary | ICD-10-CM | POA: Diagnosis not present

## 2019-07-08 DIAGNOSIS — E1129 Type 2 diabetes mellitus with other diabetic kidney complication: Secondary | ICD-10-CM | POA: Diagnosis not present

## 2019-07-08 DIAGNOSIS — E211 Secondary hyperparathyroidism, not elsewhere classified: Secondary | ICD-10-CM | POA: Diagnosis not present

## 2019-07-08 DIAGNOSIS — N189 Chronic kidney disease, unspecified: Secondary | ICD-10-CM | POA: Diagnosis not present

## 2019-07-08 DIAGNOSIS — I5032 Chronic diastolic (congestive) heart failure: Secondary | ICD-10-CM | POA: Diagnosis not present

## 2019-07-13 ENCOUNTER — Other Ambulatory Visit: Payer: Self-pay | Admitting: Family Medicine

## 2019-07-13 DIAGNOSIS — F339 Major depressive disorder, recurrent, unspecified: Secondary | ICD-10-CM

## 2019-07-13 NOTE — Telephone Encounter (Signed)
Furosemide refilled  Lorazepam denied-ntbs for refills of controlled substance

## 2019-07-19 DIAGNOSIS — H353211 Exudative age-related macular degeneration, right eye, with active choroidal neovascularization: Secondary | ICD-10-CM | POA: Diagnosis not present

## 2019-07-19 DIAGNOSIS — H43813 Vitreous degeneration, bilateral: Secondary | ICD-10-CM | POA: Diagnosis not present

## 2019-07-19 DIAGNOSIS — H353123 Nonexudative age-related macular degeneration, left eye, advanced atrophic without subfoveal involvement: Secondary | ICD-10-CM | POA: Diagnosis not present

## 2019-07-19 DIAGNOSIS — H35373 Puckering of macula, bilateral: Secondary | ICD-10-CM | POA: Diagnosis not present

## 2019-07-27 ENCOUNTER — Encounter: Payer: Self-pay | Admitting: Family Medicine

## 2019-07-27 ENCOUNTER — Ambulatory Visit: Payer: Medicare PPO | Admitting: Family Medicine

## 2019-07-27 ENCOUNTER — Other Ambulatory Visit: Payer: Self-pay

## 2019-07-27 VITALS — BP 139/72 | HR 77 | Temp 98.7°F | Ht 64.0 in | Wt 226.0 lb

## 2019-07-27 DIAGNOSIS — I152 Hypertension secondary to endocrine disorders: Secondary | ICD-10-CM

## 2019-07-27 DIAGNOSIS — I10 Essential (primary) hypertension: Secondary | ICD-10-CM

## 2019-07-27 DIAGNOSIS — E1159 Type 2 diabetes mellitus with other circulatory complications: Secondary | ICD-10-CM

## 2019-07-27 DIAGNOSIS — E1140 Type 2 diabetes mellitus with diabetic neuropathy, unspecified: Secondary | ICD-10-CM

## 2019-07-27 DIAGNOSIS — E1122 Type 2 diabetes mellitus with diabetic chronic kidney disease: Secondary | ICD-10-CM | POA: Diagnosis not present

## 2019-07-27 DIAGNOSIS — F339 Major depressive disorder, recurrent, unspecified: Secondary | ICD-10-CM

## 2019-07-27 DIAGNOSIS — N183 Chronic kidney disease, stage 3 unspecified: Secondary | ICD-10-CM | POA: Diagnosis not present

## 2019-07-27 LAB — BAYER DCA HB A1C WAIVED: HB A1C (BAYER DCA - WAIVED): 6.6 % (ref <7.0)

## 2019-07-27 MED ORDER — PRAVASTATIN SODIUM 20 MG PO TABS
20.0000 mg | ORAL_TABLET | Freq: Every day | ORAL | 3 refills | Status: DC
Start: 2019-07-27 — End: 2019-08-05

## 2019-07-27 MED ORDER — LORAZEPAM 0.5 MG PO TABS: 0.5000 mg | ORAL_TABLET | Freq: Every day | ORAL | 2 refills | Status: DC

## 2019-07-27 NOTE — Addendum Note (Signed)
Addended by: Caryl Pina on: 07/27/2019 03:48 PM   Modules accepted: Orders

## 2019-07-27 NOTE — Progress Notes (Signed)
BP 139/72   Pulse 77   Temp 98.7 F (37.1 C)   Ht 5\' 4"  (1.626 m)   Wt 226 lb (102.5 kg)   SpO2 96%   BMI 38.79 kg/m    Subjective:   Patient ID: Anna Dickerson, female    DOB: 1929-12-17, 86 y.o.   MRN: [id number]  HPI: SASHENKA FAKES is a 86 y.o. female presenting on 07/27/2019 for Medical Management of Chronic Issues and Diabetes   HPI Anxiety Current rx-lorazepam 0.5 mg nightly as needed # meds rx-30 Effectiveness of current meds-works well Adverse reactions form meds-none  Pill count performed-No Last drug screen -05/03/2019 ( high risk q51m, moderate risk q49m, low risk yearly ) Urine drug screen today- No Was the NCCSR reviewed-yes  If yes were their any concerning findings? -Had been feeling more early recently over the past few months, we will continue to monitor closely.  We will get her back on schedule  No flowsheet data found.   Controlled substance contract signed on: 05/05/2019  Type 2 diabetes mellitus Patient comes in today for recheck of his diabetes. Patient has been currently taking no medication currently has been doing diet, A1c 6.6 today. Patient is not currently on an ACE inhibitor/ARB. Patient has seen an ophthalmologist this year. Patient denies any issues with their feet. The symptom started onset as an adult CKD stage IV and hypertension and hyperlipidemia ARE RELATED TO DM   Hypertension Patient is currently on amlodipine and carvedilol and clonidine, and hydralazine and torsemide, patient sees nephrology, and their blood pressure today is 139/72. Patient denies any lightheadedness or dizziness. Patient denies headaches, blurred vision, chest pains, shortness of breath, or weakness. Denies any side effects from medication and is content with current medication.   Hyperlipidemia Patient is coming in for recheck of his hyperlipidemia. The patient is currently taking atorvastatin, still has muscle aches. They deny any issues with history of  liver damage from it. They deny any focal numbness or weakness or chest pain.   Relevant past medical, surgical, family and social history reviewed and updated as indicated. Interim medical history since our last visit reviewed. Allergies and medications reviewed and updated.  Review of Systems  Constitutional: Negative for chills and fever.  Eyes: Negative for visual disturbance.  Respiratory: Negative for chest tightness and shortness of breath.   Cardiovascular: Positive for leg swelling. Negative for chest pain.  Genitourinary: Negative for difficulty urinating and dysuria.  Musculoskeletal: Positive for arthralgias. Negative for back pain and gait problem.  Skin: Negative for rash.  Neurological: Negative for light-headedness and headaches.  Psychiatric/Behavioral: Negative for agitation and behavioral problems.  All other systems reviewed and are negative.   Per HPI unless specifically indicated above   Allergies as of 07/27/2019      Reactions   Sulfa Antibiotics    Mouth ulcers      Medication List       Accurate as of July 27, 2019  2:16 PM. If you have any questions, ask your nurse or doctor.        STOP taking these medications   furosemide 20 MG tablet Commonly known as: LASIX Stopped by: Elige Radon Crestina Strike, MD   pravastatin 20 MG tablet Commonly known as: PRAVACHOL Stopped by: Elige Radon Cayli Escajeda, MD     TAKE these medications   Accu-Chek Aviva Plus test strip Generic drug: glucose blood CHECK BLOOD SUGAR UP TO 3 TIMES A DAY AS DIRECTED   Accu-Chek Aviva Plus  w/Device Kit Check BS up to three times daily   Accu-Chek Softclix Lancets lancets CHECK BLOOD SUGAR UP TO 3 TIMES A DAY AS DIRECTED   amLODipine 10 MG tablet Commonly known as: NORVASC TAKE 1 TABLET ONCE DAILY   aspirin 81 MG tablet Take 81 mg by mouth daily.   calcium carbonate 1500 (600 Ca) MG Tabs tablet Commonly known as: OSCAL Take 600 mg of elemental calcium by mouth daily with  breakfast.   carvedilol 25 MG tablet Commonly known as: COREG Take 1 tablet (25 mg total) by mouth 2 (two) times daily with a meal.   cholecalciferol 25 MCG (1000 UNIT) tablet Commonly known as: VITAMIN D3 Take 1,000 Units by mouth daily.   cloNIDine 0.1 MG tablet Commonly known as: CATAPRES Take 1 tablet (0.1 mg total) by mouth 3 (three) times daily.   Fish Oil 1200 MG Caps Take 1,200 mg by mouth in the morning and at bedtime.   fluticasone 50 MCG/ACT nasal spray Commonly known as: FLONASE Place 1 spray into both nostrils 2 (two) times daily as needed for allergies or rhinitis.   hydrALAZINE 25 MG tablet Commonly known as: APRESOLINE Take 25-50 mg by mouth See admin instructions. 25 mg in the morning, 50 mg and the evening   linaclotide 72 MCG capsule Commonly known as: Linzess Take 1 capsule (72 mcg total) by mouth daily before breakfast. What changed:   when to take this  reasons to take this   Livalo 2 MG Tabs Generic drug: Pitavastatin Calcium Take 1 tablet (2 mg total) by mouth daily.   loratadine 10 MG tablet Commonly known as: CLARITIN Take 10 mg by mouth daily as needed for allergies.   LORazepam 0.5 MG tablet Commonly known as: ATIVAN Take 1 tablet (0.5 mg total) by mouth at bedtime. What changed:   when to take this  reasons to take this   melatonin 5 MG Tabs Take 1.5 tablets (7.5 mg total) by mouth at bedtime. What changed: how much to take   Melatonin/Vitamin B-6 Ex St 5-1 MG Tabs Generic drug: Melatonin-Pyridoxine TAKE ONE TABLET AT BEDTIME   PreserVision AREDS 2 Caps Take 1 capsule by mouth 2 (two) times daily.   torsemide 100 MG tablet Commonly known as: DEMADEX Take 100 mg by mouth daily.   Trulicity 0.75 MG/0.5ML Sopn Generic drug: Dulaglutide INJECT 0.75 MG ONCE A WEEK   venlafaxine XR 150 MG 24 hr capsule Commonly known as: EFFEXOR-XR Take 1 capsule (150 mg total) by mouth daily with breakfast.   Vitamin D (Ergocalciferol)  1.25 MG (50000 UNIT) Caps capsule Commonly known as: DRISDOL TAKE 1 CAPSULE ONCE A WEEK        Objective:   BP 139/72   Pulse 77   Temp 98.7 F (37.1 C)   Ht 5\' 4"  (1.626 m)   Wt 226 lb (102.5 kg)   SpO2 96%   BMI 38.79 kg/m   Wt Readings from Last 3 Encounters:  07/27/19 226 lb (102.5 kg)  05/06/19 234 lb (106.1 kg)  04/25/19 234 lb (106.1 kg)    Physical Exam Vitals and nursing note reviewed.  Constitutional:      General: She is not in acute distress.    Appearance: She is well-developed. She is not diaphoretic.  Eyes:     Conjunctiva/sclera: Conjunctivae normal.  Cardiovascular:     Rate and Rhythm: Normal rate and regular rhythm.     Heart sounds: Normal heart sounds. No murmur heard.   Pulmonary:  Effort: Pulmonary effort is normal. No respiratory distress.     Breath sounds: Normal breath sounds. No wheezing.  Musculoskeletal:        General: No tenderness. Normal range of motion.  Skin:    General: Skin is warm and dry.     Findings: No rash.  Neurological:     Mental Status: She is alert and oriented to person, place, and time.     Coordination: Coordination normal.  Psychiatric:        Behavior: Behavior normal.     Diabetic Foot Exam - Simple   Simple Foot Form Diabetic Foot exam was performed with the following findings: Yes 07/27/2019  2:19 PM  Visual Inspection No deformities, no ulcerations, no other skin breakdown bilaterally: Yes Sensation Testing Intact to touch and monofilament testing bilaterally: Yes Pulse Check Posterior Tibialis and Dorsalis pulse intact bilaterally: Yes Comments      Assessment & Plan:   Problem List Items Addressed This Visit      Cardiovascular and Mediastinum   Hypertension associated with diabetes (HCC)   Relevant Medications   torsemide (DEMADEX) 100 MG tablet   pravastatin (PRAVACHOL) 20 MG tablet     Endocrine   CKD stage 3 due to type 2 diabetes mellitus (HCC)   Relevant Medications    pravastatin (PRAVACHOL) 20 MG tablet   Other Relevant Orders   CMP14+EGFR   Type 2 diabetes mellitus with diabetic neuropathy, without long-term current use of insulin (HCC) - Primary   Relevant Medications   pravastatin (PRAVACHOL) 20 MG tablet   Other Relevant Orders   Bayer DCA Hb A1c Waived   CMP14+EGFR     Other   Morbid obesity (HCC)   Depression, recurrent (HCC)   Relevant Medications   LORazepam (ATIVAN) 0.5 MG tablet      Continue lorazepam as needed but will continue to monitor and make sure she does not increase or take it more frequently.  Return to pravastatin because the Livalo did not make any difference in the muscle aches and it was too expensive.  Patient would like a device because of poor finger dexterity to help with her blood sugar so she does not have to stick her self because it is becoming more and more challenging to do so. Follow up plan: Return in about 3 months (around 10/27/2019), or if symptoms worsen or fail to improve, for Diabetes and anxiety recheck.  Counseling provided for all of the vaccine components Orders Placed This Encounter  Procedures  . Bayer DCA Hb A1c Waived  . CMP14+EGFR    Arville Care, MD [facility] 07/27/2019, 2:16 PM

## 2019-07-28 LAB — CMP14+EGFR
ALT: 12 IU/L (ref 0–32)
AST: 13 IU/L (ref 0–40)
Albumin/Globulin Ratio: 1.2 (ref 1.2–2.2)
Albumin: 3.8 g/dL (ref 3.6–4.6)
Alkaline Phosphatase: 85 IU/L (ref 48–121)
BUN/Creatinine Ratio: 14 (ref 12–28)
BUN: 45 mg/dL — ABNORMAL HIGH (ref 8–27)
Bilirubin Total: 0.2 mg/dL (ref 0.0–1.2)
CO2: 23 mmol/L (ref 20–29)
Calcium: 8.8 mg/dL (ref 8.7–10.3)
Chloride: 101 mmol/L (ref 96–106)
Creatinine, Ser: 3.22 mg/dL (ref 0.57–1.00)
GFR calc Af Amer: 14 mL/min/{1.73_m2} — ABNORMAL LOW (ref >59)
GFR calc non Af Amer: 12 mL/min/{1.73_m2} — ABNORMAL LOW (ref >59)
Globulin, Total: 3.2 g/dL (ref 1.5–4.5)
Glucose: 159 mg/dL — ABNORMAL HIGH (ref 65–99)
Potassium: 3.7 mmol/L (ref 3.5–5.2)
Sodium: 140 mmol/L (ref 134–144)
Total Protein: 7 g/dL (ref 6.0–8.5)

## 2019-07-29 ENCOUNTER — Ambulatory Visit: Payer: Medicare PPO | Admitting: Pharmacist

## 2019-08-01 ENCOUNTER — Ambulatory Visit: Payer: Medicare PPO | Admitting: Pharmacist

## 2019-08-01 ENCOUNTER — Other Ambulatory Visit: Payer: Self-pay

## 2019-08-01 ENCOUNTER — Other Ambulatory Visit: Payer: Self-pay | Admitting: Family Medicine

## 2019-08-01 DIAGNOSIS — G720 Drug-induced myopathy: Secondary | ICD-10-CM | POA: Diagnosis not present

## 2019-08-01 DIAGNOSIS — E1140 Type 2 diabetes mellitus with diabetic neuropathy, unspecified: Secondary | ICD-10-CM

## 2019-08-01 NOTE — Progress Notes (Signed)
    08/01/2019 Name: Anna Dickerson MRN: [id number] DOB: Aug 28, 1929   S:  51 yoF presents for diabetes evaluation, education, and management Patient was referred and last seen by Primary Care Provider on 07/27/19. Insurance coverage/medication affordability: humana  Patient reports adherence with medications. . Current diabetes medications include: trulicity . Current hypertension medications include: amlodipine, hydralazine, coreg Goal 130/80 . Current hyperlipidemia medications include: pitavastatin (can't afford, not taking) o STATIN INTOLERANCE DOCUMENTED IN PATIENT'S MEDICAL RECORD.  ICD10: G72   Patient denies hypoglycemic events.   Patient reported dietary habits: Eats 2-3 meals/day Discussed meal planning options and Plate method for healthy eating . Avoid sugary drinks and desserts . Incorporate balanced protein, non starchy veggies, 1 serving of carbohydrate with each meal . Increase water intake . Increase physical activity as able  Patient-reported exercise habits: n/a  O:  Lab Results  Component Value Date   HGBA1C 6.6 07/27/2019   Lipid Panel     Component Value Date/Time   CHOL 123 04/25/2019 1438   TRIG 209 (H) 04/25/2019 1438   HDL 37 (L) 04/25/2019 1438   CHOLHDL 3.3 04/25/2019 1438   LDLCALC 52 04/25/2019 1438    Home fasting blood sugars: n/a  2 hour post-meal/random blood sugars: n/a.   A/P:  Diabetes T2DM currently controlled. Patient is able to verbalize appropriate hypoglycemia management plan. Patient is adherent with medication.  -Continue Trulicity 0.75mg  sq weekly Patient tolerating well and denies history of thyroid cancer   WILL APPLY FOR PAP via [place]  AWAITING [employer]  -NEXLETOL was initially going to be started for LDL lowering (statin intolerant) Patient has decided to start Crestor three times weekly  CAN'T AFFORD LIVALO   Patient has tried and failed: Pravastatin,  atorvastatin  -Patient would like libre, however insurance will not approve  -LINZESS 78mcg for IBS/constipation  SAMPLES given --> GNO#[id number], EXP 2/23  -Extensively discussed pathophysiology of diabetes, recommended lifestyle interventions, dietary effects on blood sugar control  -Counseled on s/sx of and management of hypoglycemia  -Next A1C anticipated 6-12 months    Written patient instructions provided.  Total time in face to face counseling 30 minutes.   Follow up Pharmacist in 4 weeks   Regina Eck, PharmD, BCPS Clinical Pharmacist, [place]  II Phone [phone]

## 2019-08-02 ENCOUNTER — Encounter: Payer: Self-pay | Admitting: Family Medicine

## 2019-08-03 ENCOUNTER — Other Ambulatory Visit: Payer: Self-pay | Admitting: Family Medicine

## 2019-08-03 MED ORDER — ROSUVASTATIN CALCIUM 5 MG PO TABS: 5.0000 mg | ORAL_TABLET | ORAL | 3 refills | Status: DC

## 2019-08-05 ENCOUNTER — Encounter: Payer: Self-pay | Admitting: Pharmacist

## 2019-08-05 ENCOUNTER — Telehealth: Payer: Self-pay | Admitting: Pharmacist

## 2019-08-05 NOTE — Telephone Encounter (Signed)
Patient assistance application faxed to [facility] for Trulicity Patient must call [phone] to set up shipment once approved Medication will deliver to patient's home

## 2019-08-16 ENCOUNTER — Telehealth: Payer: Self-pay | Admitting: Pharmacist

## 2019-08-16 NOTE — Telephone Encounter (Signed)
Patient approved for [employer] Patient Assistance Program (Trulicity) Call placed to [employer] (pharmacy for [facility]) Tuesday, August 04YP Trulicity will be delivered to patient's home Patient made aware  Tolerating statin TIW-continue current management

## 2019-08-19 ENCOUNTER — Other Ambulatory Visit: Payer: Self-pay

## 2019-08-19 ENCOUNTER — Other Ambulatory Visit: Payer: Medicare PPO

## 2019-08-19 DIAGNOSIS — I129 Hypertensive chronic kidney disease with stage 1 through stage 4 chronic kidney disease, or unspecified chronic kidney disease: Secondary | ICD-10-CM | POA: Diagnosis not present

## 2019-08-19 DIAGNOSIS — E211 Secondary hyperparathyroidism, not elsewhere classified: Secondary | ICD-10-CM | POA: Diagnosis not present

## 2019-08-19 DIAGNOSIS — N185 Chronic kidney disease, stage 5: Secondary | ICD-10-CM | POA: Diagnosis not present

## 2019-08-19 DIAGNOSIS — D631 Anemia in chronic kidney disease: Secondary | ICD-10-CM | POA: Diagnosis not present

## 2019-08-19 DIAGNOSIS — E1122 Type 2 diabetes mellitus with diabetic chronic kidney disease: Secondary | ICD-10-CM | POA: Diagnosis not present

## 2019-08-19 DIAGNOSIS — N189 Chronic kidney disease, unspecified: Secondary | ICD-10-CM | POA: Diagnosis not present

## 2019-08-19 DIAGNOSIS — E1129 Type 2 diabetes mellitus with other diabetic kidney complication: Secondary | ICD-10-CM | POA: Diagnosis not present

## 2019-08-19 DIAGNOSIS — R809 Proteinuria, unspecified: Secondary | ICD-10-CM | POA: Diagnosis not present

## 2019-08-19 DIAGNOSIS — I5032 Chronic diastolic (congestive) heart failure: Secondary | ICD-10-CM | POA: Diagnosis not present

## 2019-08-23 ENCOUNTER — Encounter: Payer: Self-pay | Admitting: Family Medicine

## 2019-08-24 ENCOUNTER — Other Ambulatory Visit: Payer: Self-pay | Admitting: Family Medicine

## 2019-08-26 DIAGNOSIS — D631 Anemia in chronic kidney disease: Secondary | ICD-10-CM | POA: Diagnosis not present

## 2019-08-26 DIAGNOSIS — N189 Chronic kidney disease, unspecified: Secondary | ICD-10-CM | POA: Diagnosis not present

## 2019-08-26 DIAGNOSIS — E1129 Type 2 diabetes mellitus with other diabetic kidney complication: Secondary | ICD-10-CM | POA: Diagnosis not present

## 2019-08-26 DIAGNOSIS — E211 Secondary hyperparathyroidism, not elsewhere classified: Secondary | ICD-10-CM | POA: Diagnosis not present

## 2019-08-26 DIAGNOSIS — E1122 Type 2 diabetes mellitus with diabetic chronic kidney disease: Secondary | ICD-10-CM | POA: Diagnosis not present

## 2019-08-26 DIAGNOSIS — N185 Chronic kidney disease, stage 5: Secondary | ICD-10-CM | POA: Diagnosis not present

## 2019-08-26 DIAGNOSIS — R809 Proteinuria, unspecified: Secondary | ICD-10-CM | POA: Diagnosis not present

## 2019-09-13 ENCOUNTER — Other Ambulatory Visit: Payer: Self-pay | Admitting: Family Medicine

## 2019-09-13 DIAGNOSIS — I10 Essential (primary) hypertension: Secondary | ICD-10-CM

## 2019-09-29 ENCOUNTER — Other Ambulatory Visit: Payer: Self-pay | Admitting: Family Medicine

## 2019-09-29 DIAGNOSIS — K5909 Other constipation: Secondary | ICD-10-CM

## 2019-09-30 ENCOUNTER — Other Ambulatory Visit: Payer: Medicare PPO

## 2019-09-30 ENCOUNTER — Other Ambulatory Visit: Payer: Self-pay

## 2019-09-30 DIAGNOSIS — N189 Chronic kidney disease, unspecified: Secondary | ICD-10-CM | POA: Diagnosis not present

## 2019-09-30 DIAGNOSIS — R809 Proteinuria, unspecified: Secondary | ICD-10-CM | POA: Diagnosis not present

## 2019-09-30 DIAGNOSIS — E211 Secondary hyperparathyroidism, not elsewhere classified: Secondary | ICD-10-CM | POA: Diagnosis not present

## 2019-09-30 DIAGNOSIS — E1122 Type 2 diabetes mellitus with diabetic chronic kidney disease: Secondary | ICD-10-CM | POA: Diagnosis not present

## 2019-09-30 DIAGNOSIS — D631 Anemia in chronic kidney disease: Secondary | ICD-10-CM | POA: Diagnosis not present

## 2019-09-30 DIAGNOSIS — E1129 Type 2 diabetes mellitus with other diabetic kidney complication: Secondary | ICD-10-CM | POA: Diagnosis not present

## 2019-09-30 DIAGNOSIS — N185 Chronic kidney disease, stage 5: Secondary | ICD-10-CM | POA: Diagnosis not present

## 2019-10-03 IMAGING — DX DG ELBOW COMPLETE 3+V*R*
4 series · 4 of 4 positions shown · non-contrast
Comparison: None.

CLINICAL DATA: Initial evaluation for acute trauma, fall.

EXAM:
RIGHT ELBOW - COMPLETE 3+ VIEW

[elbow ap]
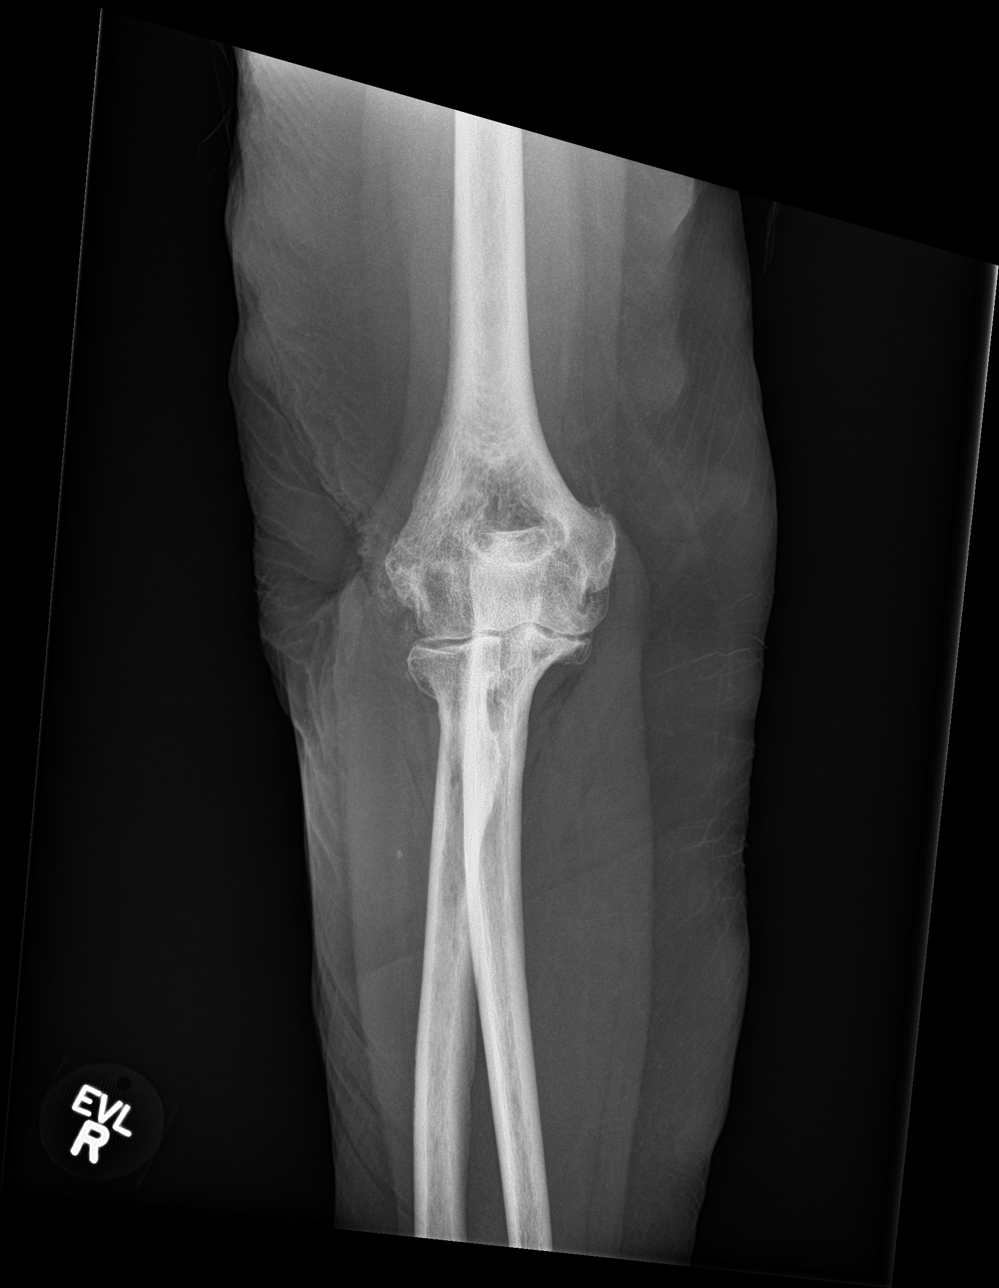

[elbow obl (1 of 2)]
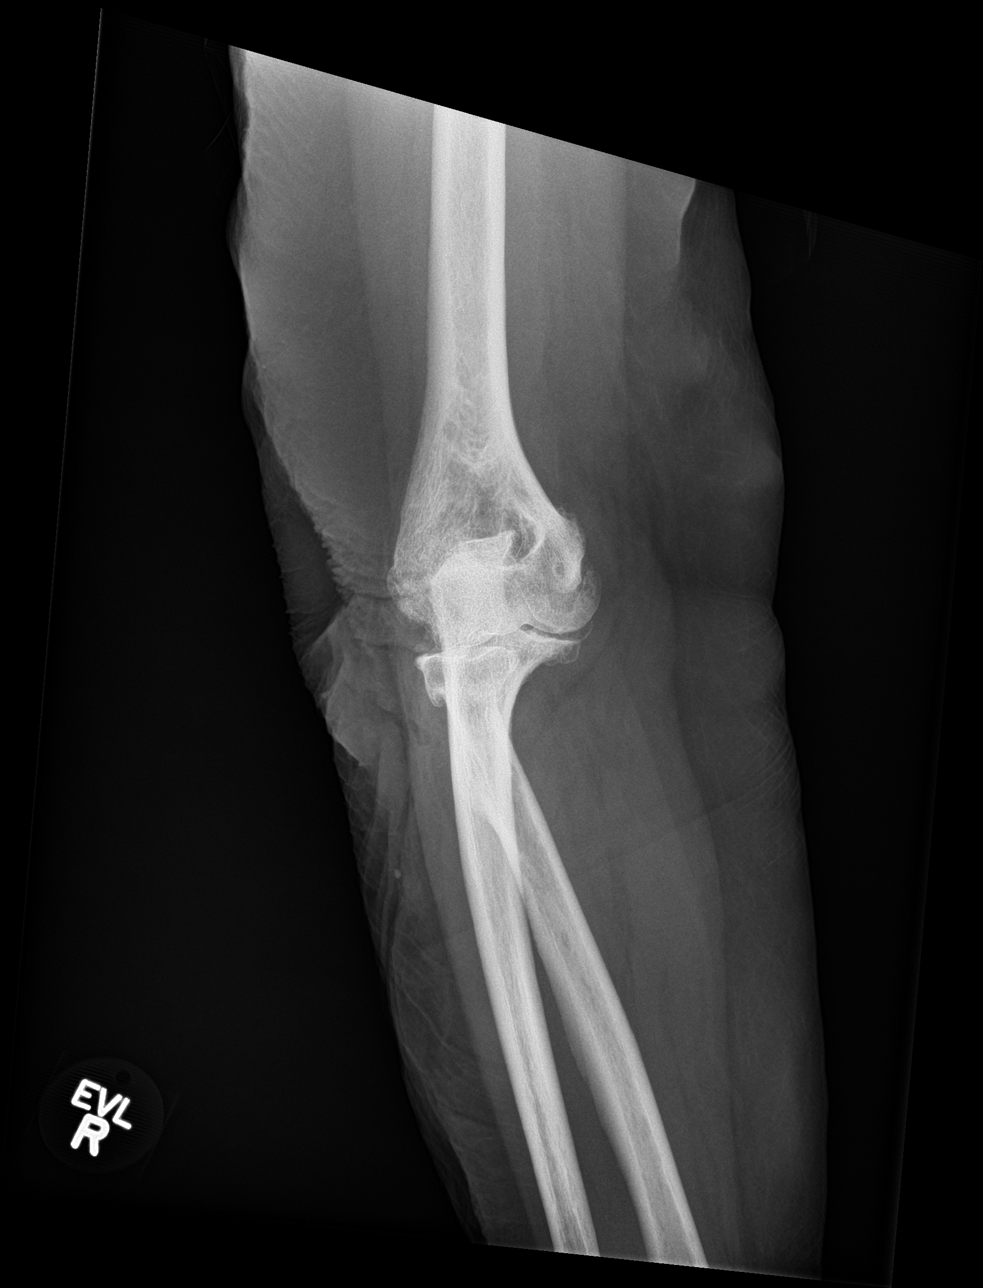

[elbow obl (2 of 2)]
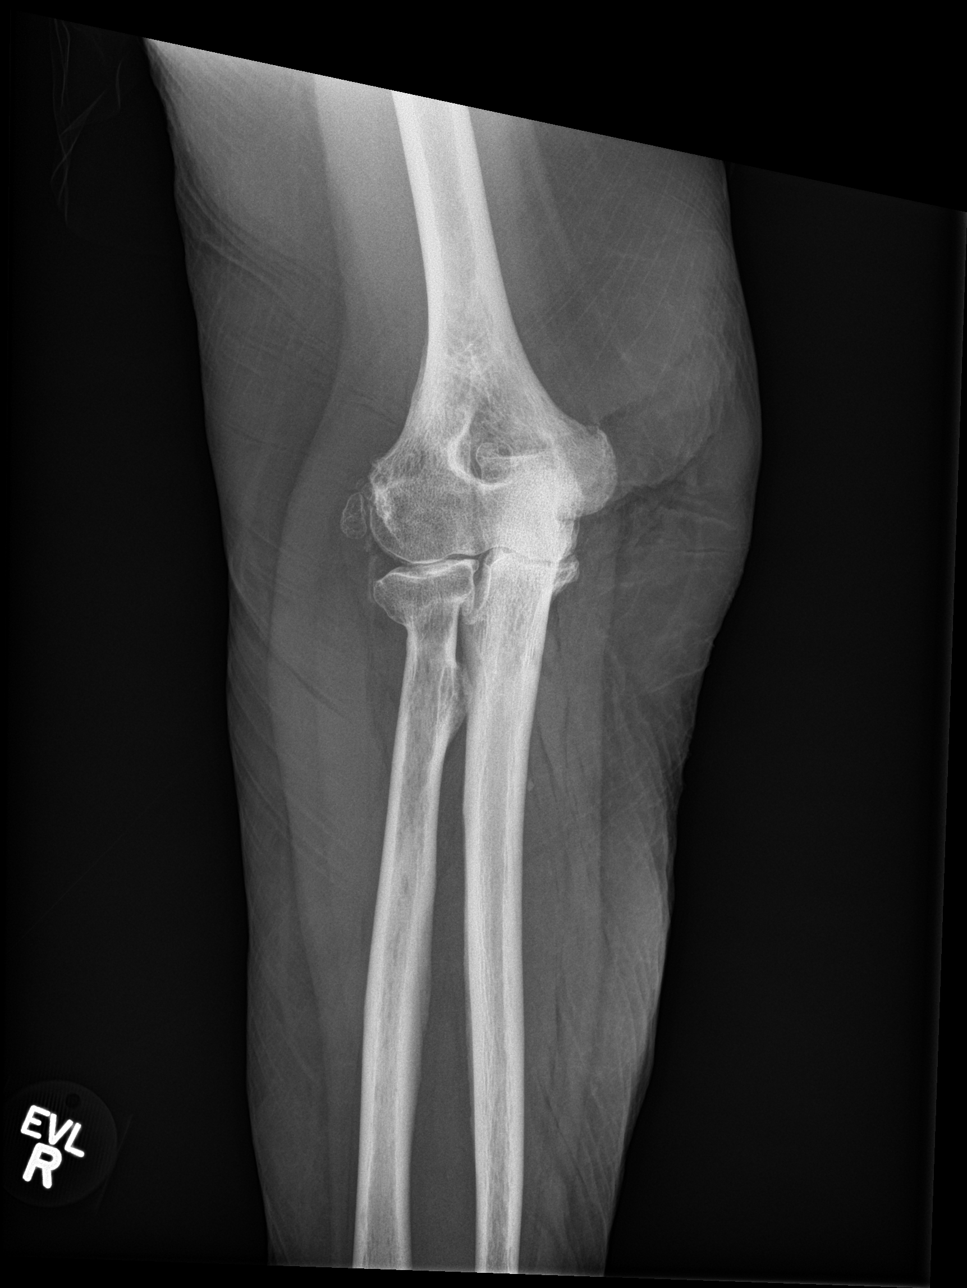

[elbow lat]
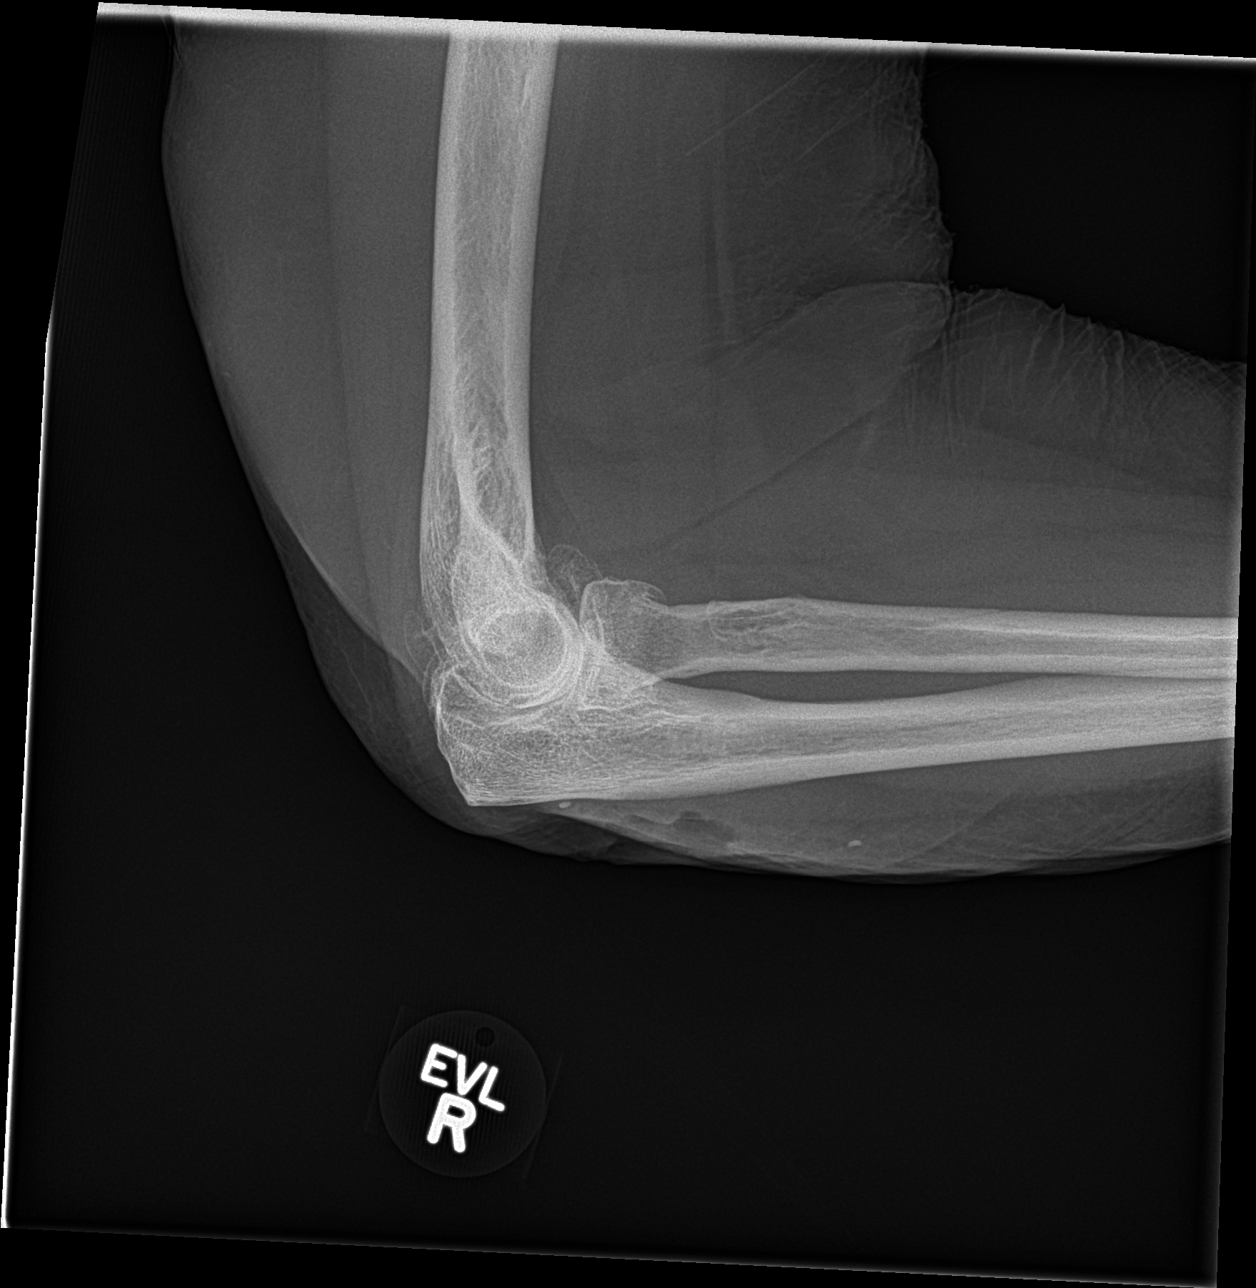

[4 of 4 positions shown; findings below may reference images not displayed]

FINDINGS: No acute fracture or dislocation. No joint effusion. Advanced
osteoarthritic changes about the elbow. Soft tissue laceration at
the posterior aspect of the elbow. No radiopaque foreign body.
IMPRESSION: 1. No acute osseous abnormality about the elbow.
2. Soft tissue laceration at the posterior aspect of the elbow. No
radiopaque foreign body.

## 2019-10-07 DIAGNOSIS — I5032 Chronic diastolic (congestive) heart failure: Secondary | ICD-10-CM | POA: Diagnosis not present

## 2019-10-07 DIAGNOSIS — N189 Chronic kidney disease, unspecified: Secondary | ICD-10-CM | POA: Diagnosis not present

## 2019-10-07 DIAGNOSIS — D631 Anemia in chronic kidney disease: Secondary | ICD-10-CM | POA: Diagnosis not present

## 2019-10-07 DIAGNOSIS — E1122 Type 2 diabetes mellitus with diabetic chronic kidney disease: Secondary | ICD-10-CM | POA: Diagnosis not present

## 2019-10-07 DIAGNOSIS — E1129 Type 2 diabetes mellitus with other diabetic kidney complication: Secondary | ICD-10-CM | POA: Diagnosis not present

## 2019-10-07 DIAGNOSIS — E211 Secondary hyperparathyroidism, not elsewhere classified: Secondary | ICD-10-CM | POA: Diagnosis not present

## 2019-10-07 DIAGNOSIS — R809 Proteinuria, unspecified: Secondary | ICD-10-CM | POA: Diagnosis not present

## 2019-10-07 DIAGNOSIS — E559 Vitamin D deficiency, unspecified: Secondary | ICD-10-CM | POA: Diagnosis not present

## 2019-10-07 DIAGNOSIS — N185 Chronic kidney disease, stage 5: Secondary | ICD-10-CM | POA: Diagnosis not present

## 2019-10-11 DIAGNOSIS — H353123 Nonexudative age-related macular degeneration, left eye, advanced atrophic without subfoveal involvement: Secondary | ICD-10-CM | POA: Diagnosis not present

## 2019-10-11 DIAGNOSIS — H35373 Puckering of macula, bilateral: Secondary | ICD-10-CM | POA: Diagnosis not present

## 2019-10-11 DIAGNOSIS — H353211 Exudative age-related macular degeneration, right eye, with active choroidal neovascularization: Secondary | ICD-10-CM | POA: Diagnosis not present

## 2019-10-11 DIAGNOSIS — H43813 Vitreous degeneration, bilateral: Secondary | ICD-10-CM | POA: Diagnosis not present

## 2019-10-22 ENCOUNTER — Other Ambulatory Visit: Payer: Self-pay | Admitting: Family Medicine

## 2019-10-22 DIAGNOSIS — F339 Major depressive disorder, recurrent, unspecified: Secondary | ICD-10-CM

## 2019-10-25 DIAGNOSIS — D044 Carcinoma in situ of skin of scalp and neck: Secondary | ICD-10-CM | POA: Diagnosis not present

## 2019-10-25 DIAGNOSIS — Z85828 Personal history of other malignant neoplasm of skin: Secondary | ICD-10-CM | POA: Diagnosis not present

## 2019-10-25 DIAGNOSIS — D485 Neoplasm of uncertain behavior of skin: Secondary | ICD-10-CM | POA: Diagnosis not present

## 2019-10-27 ENCOUNTER — Ambulatory Visit: Payer: Medicare PPO | Admitting: Family Medicine

## 2019-10-27 ENCOUNTER — Encounter: Payer: Self-pay | Admitting: Family Medicine

## 2019-10-27 ENCOUNTER — Other Ambulatory Visit: Payer: Self-pay

## 2019-10-27 VITALS — BP 161/70 | HR 80 | Temp 97.8°F | Ht 64.0 in | Wt 220.5 lb

## 2019-10-27 DIAGNOSIS — E1139 Type 2 diabetes mellitus with other diabetic ophthalmic complication: Secondary | ICD-10-CM | POA: Diagnosis not present

## 2019-10-27 DIAGNOSIS — E1159 Type 2 diabetes mellitus with other circulatory complications: Secondary | ICD-10-CM

## 2019-10-27 DIAGNOSIS — E1122 Type 2 diabetes mellitus with diabetic chronic kidney disease: Secondary | ICD-10-CM

## 2019-10-27 DIAGNOSIS — F32A Depression, unspecified: Secondary | ICD-10-CM

## 2019-10-27 DIAGNOSIS — F419 Anxiety disorder, unspecified: Secondary | ICD-10-CM | POA: Diagnosis not present

## 2019-10-27 DIAGNOSIS — F339 Major depressive disorder, recurrent, unspecified: Secondary | ICD-10-CM

## 2019-10-27 DIAGNOSIS — I152 Hypertension secondary to endocrine disorders: Secondary | ICD-10-CM

## 2019-10-27 DIAGNOSIS — I10 Essential (primary) hypertension: Secondary | ICD-10-CM | POA: Diagnosis not present

## 2019-10-27 DIAGNOSIS — E1140 Type 2 diabetes mellitus with diabetic neuropathy, unspecified: Secondary | ICD-10-CM | POA: Diagnosis not present

## 2019-10-27 DIAGNOSIS — N183 Chronic kidney disease, stage 3 unspecified: Secondary | ICD-10-CM

## 2019-10-27 LAB — URINALYSIS, ROUTINE W REFLEX MICROSCOPIC
Bilirubin, UA: NEGATIVE
Glucose, UA: NEGATIVE
Ketones, UA: NEGATIVE
Nitrite, UA: NEGATIVE
RBC, UA: NEGATIVE
Specific Gravity, UA: 1.02 (ref 1.005–1.030)
Urobilinogen, Ur: 0.2 mg/dL (ref 0.2–1.0)
pH, UA: 5 (ref 5.0–7.5)

## 2019-10-27 LAB — MICROSCOPIC EXAMINATION
Epithelial Cells (non renal): NONE SEEN /hpf (ref 0–10)
RBC, Urine: NONE SEEN /hpf (ref 0–2)
WBC, UA: >30 /hpf — ABNORMAL HIGH (ref 0–5)

## 2019-10-27 LAB — BAYER DCA HB A1C WAIVED: HB A1C (BAYER DCA - WAIVED): 6.3 % (ref <7.0)

## 2019-10-27 MED ORDER — VENLAFAXINE HCL ER 150 MG PO CP24: 150.0000 mg | ORAL_CAPSULE | Freq: Every day | ORAL | 3 refills | Status: AC

## 2019-10-27 MED ORDER — AMLODIPINE BESYLATE 10 MG PO TABS: 10.0000 mg | ORAL_TABLET | Freq: Every day | ORAL | 3 refills | Status: AC

## 2019-10-27 MED ORDER — LORAZEPAM 0.5 MG PO TABS: 0.5000 mg | ORAL_TABLET | Freq: Every day | ORAL | 2 refills | Status: AC

## 2019-10-27 MED ORDER — ROSUVASTATIN CALCIUM 5 MG PO TABS: 5.0000 mg | ORAL_TABLET | ORAL | 3 refills | Status: AC

## 2019-10-27 MED ORDER — CARVEDILOL 25 MG PO TABS: 25.0000 mg | ORAL_TABLET | Freq: Two times a day (BID) | ORAL | 3 refills | Status: AC

## 2019-10-27 MED ORDER — TRULICITY 0.75 MG/0.5ML ~~LOC~~ SOAJ: 0.7500 mg | SUBCUTANEOUS | 3 refills | Status: AC

## 2019-10-27 NOTE — Progress Notes (Signed)
BP (!) 161/70   Pulse 80   Temp 97.8 F (36.6 C)   Ht 5' 4" (1.626 m)   Wt 220 lb 8 oz (100 kg)   SpO2 97%   BMI 37.85 kg/m    Subjective:   Patient ID: Anna Dickerson, female    DOB: 1929-04-28, 86 y.o.   MRN: [id number]  HPI: Anna Dickerson is a 86 y.o. female presenting on 10/27/2019 for Medical Management of Chronic Issues and Diabetes   HPI Type 2 diabetes mellitus Patient comes in today for recheck of his diabetes. Patient has been currently taking Trulicity. Patient is not currently on an ACE inhibitor/ARB. Patient has seen an ophthalmologist this year. Patient denies any issues with their feet. The symptom started onset as an adult CKD stage III and hypertension ARE RELATED TO DM   Hypertension Patient is currently on amlodipine and carvedilol and torsemide, and their blood pressure today is 161/70 and has been running elevated and we're deferring for now to the nephrologist because it has been very challenging to try to get her blood pressures under control.. Patient denies any lightheadedness or dizziness. Patient denies headaches, blurred vision, chest pains, shortness of breath, or weakness. Denies any side effects from medication and is content with current medication.   Hyperlipidemia Patient is coming in for recheck of his hyperlipidemia. The patient is currently taking Crestor and fish oils. They deny any issues with myalgias or history of liver damage from it. They deny any focal numbness or weakness or chest pain.   Patient is coming in for depression anxiety recheck. She is currently taking Effexor and trying to use melatonin for sleep but also using lorazepam as needed to sleep.  Patient has CKD stage III and is managed by nephrology.  Relevant past medical, surgical, family and social history reviewed and updated as indicated. Interim medical history since our last visit reviewed. Allergies and medications reviewed and updated.  Review of Systems    Constitutional: Negative for chills and fever.  Eyes: Negative for visual disturbance.  Respiratory: Negative for chest tightness and shortness of breath.   Cardiovascular: Negative for chest pain and leg swelling.  Musculoskeletal: Negative for back pain and gait problem.  Skin: Negative for rash.  Neurological: Negative for light-headedness and headaches.  Psychiatric/Behavioral: Negative for agitation and behavioral problems.  All other systems reviewed and are negative.   Per HPI unless specifically indicated above   Allergies as of 10/27/2019      Reactions   Sulfa Antibiotics    Mouth ulcers   Statins    Pravastatin, atorvastatin --MYOPATHY      Medication List       Accurate as of October 27, 2019  2:21 PM. If you have any questions, ask your nurse or doctor.        Accu-Chek Aviva Plus w/Device Kit Check BS up to three times daily   Accu-Chek Guide test strip Generic drug: glucose blood Check BS up to 3 times daily Dx E11.40   Accu-Chek Softclix Lancets lancets Check BS up to 3 times daily Dx E11.40   amLODipine 10 MG tablet Commonly known as: NORVASC Take 1 tablet (10 mg total) by mouth daily.   aspirin 81 MG tablet Take 81 mg by mouth daily.   calcium carbonate 1500 (600 Ca) MG Tabs tablet Commonly known as: OSCAL Take 600 mg of elemental calcium by mouth daily with breakfast.   carvedilol 25 MG tablet Commonly known as: COREG Take  BP (!) 161/70   Pulse 80   Temp 97.8 F (36.6 C)   Ht 5' 4" (1.626 m)   Wt 220 lb 8 oz (100 kg)   SpO2 97%   BMI 37.85 kg/m    Subjective:   Patient ID: Anna Dickerson, female    DOB: 1929-04-28, 86 y.o.   MRN: [id number]  HPI: Anna Dickerson is a 86 y.o. female presenting on 10/27/2019 for Medical Management of Chronic Issues and Diabetes   HPI Type 2 diabetes mellitus Patient comes in today for recheck of his diabetes. Patient has been currently taking Trulicity. Patient is not currently on an ACE inhibitor/ARB. Patient has seen an ophthalmologist this year. Patient denies any issues with their feet. The symptom started onset as an adult CKD stage III and hypertension ARE RELATED TO DM   Hypertension Patient is currently on amlodipine and carvedilol and torsemide, and their blood pressure today is 161/70 and has been running elevated and we're deferring for now to the nephrologist because it has been very challenging to try to get her blood pressures under control.. Patient denies any lightheadedness or dizziness. Patient denies headaches, blurred vision, chest pains, shortness of breath, or weakness. Denies any side effects from medication and is content with current medication.   Hyperlipidemia Patient is coming in for recheck of his hyperlipidemia. The patient is currently taking Crestor and fish oils. They deny any issues with myalgias or history of liver damage from it. They deny any focal numbness or weakness or chest pain.   Patient is coming in for depression anxiety recheck. She is currently taking Effexor and trying to use melatonin for sleep but also using lorazepam as needed to sleep.  Patient has CKD stage III and is managed by nephrology.  Relevant past medical, surgical, family and social history reviewed and updated as indicated. Interim medical history since our last visit reviewed. Allergies and medications reviewed and updated.  Review of Systems    Constitutional: Negative for chills and fever.  Eyes: Negative for visual disturbance.  Respiratory: Negative for chest tightness and shortness of breath.   Cardiovascular: Negative for chest pain and leg swelling.  Musculoskeletal: Negative for back pain and gait problem.  Skin: Negative for rash.  Neurological: Negative for light-headedness and headaches.  Psychiatric/Behavioral: Negative for agitation and behavioral problems.  All other systems reviewed and are negative.   Per HPI unless specifically indicated above   Allergies as of 10/27/2019      Reactions   Sulfa Antibiotics    Mouth ulcers   Statins    Pravastatin, atorvastatin --MYOPATHY      Medication List       Accurate as of October 27, 2019  2:21 PM. If you have any questions, ask your nurse or doctor.        Accu-Chek Aviva Plus w/Device Kit Check BS up to three times daily   Accu-Chek Guide test strip Generic drug: glucose blood Check BS up to 3 times daily Dx E11.40   Accu-Chek Softclix Lancets lancets Check BS up to 3 times daily Dx E11.40   amLODipine 10 MG tablet Commonly known as: NORVASC Take 1 tablet (10 mg total) by mouth daily.   aspirin 81 MG tablet Take 81 mg by mouth daily.   calcium carbonate 1500 (600 Ca) MG Tabs tablet Commonly known as: OSCAL Take 600 mg of elemental calcium by mouth daily with breakfast.   carvedilol 25 MG tablet Commonly known as: COREG Take  BP (!) 161/70   Pulse 80   Temp 97.8 F (36.6 C)   Ht 5' 4" (1.626 m)   Wt 220 lb 8 oz (100 kg)   SpO2 97%   BMI 37.85 kg/m    Subjective:   Patient ID: Anna Dickerson, female    DOB: 1929-04-28, 86 y.o.   MRN: [id number]  HPI: Anna Dickerson is a 86 y.o. female presenting on 10/27/2019 for Medical Management of Chronic Issues and Diabetes   HPI Type 2 diabetes mellitus Patient comes in today for recheck of his diabetes. Patient has been currently taking Trulicity. Patient is not currently on an ACE inhibitor/ARB. Patient has seen an ophthalmologist this year. Patient denies any issues with their feet. The symptom started onset as an adult CKD stage III and hypertension ARE RELATED TO DM   Hypertension Patient is currently on amlodipine and carvedilol and torsemide, and their blood pressure today is 161/70 and has been running elevated and we're deferring for now to the nephrologist because it has been very challenging to try to get her blood pressures under control.. Patient denies any lightheadedness or dizziness. Patient denies headaches, blurred vision, chest pains, shortness of breath, or weakness. Denies any side effects from medication and is content with current medication.   Hyperlipidemia Patient is coming in for recheck of his hyperlipidemia. The patient is currently taking Crestor and fish oils. They deny any issues with myalgias or history of liver damage from it. They deny any focal numbness or weakness or chest pain.   Patient is coming in for depression anxiety recheck. She is currently taking Effexor and trying to use melatonin for sleep but also using lorazepam as needed to sleep.  Patient has CKD stage III and is managed by nephrology.  Relevant past medical, surgical, family and social history reviewed and updated as indicated. Interim medical history since our last visit reviewed. Allergies and medications reviewed and updated.  Review of Systems    Constitutional: Negative for chills and fever.  Eyes: Negative for visual disturbance.  Respiratory: Negative for chest tightness and shortness of breath.   Cardiovascular: Negative for chest pain and leg swelling.  Musculoskeletal: Negative for back pain and gait problem.  Skin: Negative for rash.  Neurological: Negative for light-headedness and headaches.  Psychiatric/Behavioral: Negative for agitation and behavioral problems.  All other systems reviewed and are negative.   Per HPI unless specifically indicated above   Allergies as of 10/27/2019      Reactions   Sulfa Antibiotics    Mouth ulcers   Statins    Pravastatin, atorvastatin --MYOPATHY      Medication List       Accurate as of October 27, 2019  2:21 PM. If you have any questions, ask your nurse or doctor.        Accu-Chek Aviva Plus w/Device Kit Check BS up to three times daily   Accu-Chek Guide test strip Generic drug: glucose blood Check BS up to 3 times daily Dx E11.40   Accu-Chek Softclix Lancets lancets Check BS up to 3 times daily Dx E11.40   amLODipine 10 MG tablet Commonly known as: NORVASC Take 1 tablet (10 mg total) by mouth daily.   aspirin 81 MG tablet Take 81 mg by mouth daily.   calcium carbonate 1500 (600 Ca) MG Tabs tablet Commonly known as: OSCAL Take 600 mg of elemental calcium by mouth daily with breakfast.   carvedilol 25 MG tablet Commonly known as: COREG Take

## 2019-10-28 ENCOUNTER — Other Ambulatory Visit: Payer: Self-pay | Admitting: Family Medicine

## 2019-10-28 LAB — CMP14+EGFR
ALT: 12 IU/L (ref 0–32)
AST: 13 IU/L (ref 0–40)
Albumin/Globulin Ratio: 1.2 (ref 1.2–2.2)
Albumin: 3.8 g/dL (ref 3.6–4.6)
Alkaline Phosphatase: 87 IU/L (ref 44–121)
BUN/Creatinine Ratio: 14 (ref 12–28)
BUN: 43 mg/dL — ABNORMAL HIGH (ref 8–27)
Bilirubin Total: <0.2 mg/dL (ref 0.0–1.2)
CO2: 22 mmol/L (ref 20–29)
Calcium: 8.6 mg/dL — ABNORMAL LOW (ref 8.7–10.3)
Chloride: 101 mmol/L (ref 96–106)
Creatinine, Ser: 3.13 mg/dL (ref 0.57–1.00)
GFR calc Af Amer: 15 mL/min/{1.73_m2} — ABNORMAL LOW (ref >59)
GFR calc non Af Amer: 13 mL/min/{1.73_m2} — ABNORMAL LOW (ref >59)
Globulin, Total: 3.2 g/dL (ref 1.5–4.5)
Glucose: 183 mg/dL — ABNORMAL HIGH (ref 65–99)
Potassium: 3.6 mmol/L (ref 3.5–5.2)
Sodium: 141 mmol/L (ref 134–144)
Total Protein: 7 g/dL (ref 6.0–8.5)

## 2019-10-28 LAB — LIPID PANEL
Chol/HDL Ratio: 4.4 ratio (ref 0.0–4.4)
Cholesterol, Total: 137 mg/dL (ref 100–199)
HDL: 31 mg/dL — ABNORMAL LOW (ref >39)
LDL Chol Calc (NIH): 70 mg/dL (ref 0–99)
Triglycerides: 220 mg/dL — ABNORMAL HIGH (ref 0–149)
VLDL Cholesterol Cal: 36 mg/dL (ref 5–40)

## 2019-10-28 MED ORDER — CEPHALEXIN 500 MG PO CAPS: 500.0000 mg | ORAL_CAPSULE | Freq: Four times a day (QID) | ORAL | 0 refills | Status: DC

## 2019-10-31 ENCOUNTER — Encounter: Payer: Self-pay | Admitting: Family Medicine

## 2019-11-02 ENCOUNTER — Other Ambulatory Visit: Payer: Self-pay | Admitting: Family Medicine

## 2019-11-02 DIAGNOSIS — K5909 Other constipation: Secondary | ICD-10-CM

## 2019-11-03 ENCOUNTER — Other Ambulatory Visit (HOSPITAL_COMMUNITY): Payer: Self-pay

## 2019-11-03 DIAGNOSIS — Z170 Estrogen receptor positive status [ER+]: Secondary | ICD-10-CM

## 2019-11-03 DIAGNOSIS — C50912 Malignant neoplasm of unspecified site of left female breast: Secondary | ICD-10-CM

## 2019-11-04 ENCOUNTER — Ambulatory Visit (HOSPITAL_COMMUNITY)
Admission: RE | Admit: 2019-11-04 | Discharge: 2019-11-04 | Disposition: A | Discharge status: Home / Self Care | Payer: Medicare PPO | Source: Ambulatory Visit | Attending: Hematology | Admitting: Hematology

## 2019-11-04 ENCOUNTER — Inpatient Hospital Stay (HOSPITAL_COMMUNITY): Payer: Medicare PPO | Attending: Hematology

## 2019-11-04 ENCOUNTER — Ambulatory Visit (HOSPITAL_COMMUNITY): Payer: Medicare Other

## 2019-11-04 ENCOUNTER — Other Ambulatory Visit: Payer: Self-pay

## 2019-11-04 DIAGNOSIS — G479 Sleep disorder, unspecified: Secondary | ICD-10-CM | POA: Diagnosis not present

## 2019-11-04 DIAGNOSIS — C50912 Malignant neoplasm of unspecified site of left female breast: Secondary | ICD-10-CM | POA: Diagnosis not present

## 2019-11-04 DIAGNOSIS — Z170 Estrogen receptor positive status [ER+]: Secondary | ICD-10-CM | POA: Insufficient documentation

## 2019-11-04 DIAGNOSIS — Z7982 Long term (current) use of aspirin: Secondary | ICD-10-CM | POA: Insufficient documentation

## 2019-11-04 DIAGNOSIS — N189 Chronic kidney disease, unspecified: Secondary | ICD-10-CM | POA: Insufficient documentation

## 2019-11-04 DIAGNOSIS — E1136 Type 2 diabetes mellitus with diabetic cataract: Secondary | ICD-10-CM | POA: Diagnosis not present

## 2019-11-04 DIAGNOSIS — R0602 Shortness of breath: Secondary | ICD-10-CM | POA: Insufficient documentation

## 2019-11-04 DIAGNOSIS — I770 Arteriovenous fistula, acquired: Secondary | ICD-10-CM | POA: Insufficient documentation

## 2019-11-04 DIAGNOSIS — K5909 Other constipation: Secondary | ICD-10-CM | POA: Insufficient documentation

## 2019-11-04 DIAGNOSIS — Z853 Personal history of malignant neoplasm of breast: Secondary | ICD-10-CM | POA: Diagnosis not present

## 2019-11-04 DIAGNOSIS — I132 Hypertensive heart and chronic kidney disease with heart failure and with stage 5 chronic kidney disease, or end stage renal disease: Secondary | ICD-10-CM | POA: Insufficient documentation

## 2019-11-04 DIAGNOSIS — I509 Heart failure, unspecified: Secondary | ICD-10-CM | POA: Diagnosis not present

## 2019-11-04 DIAGNOSIS — Z79899 Other long term (current) drug therapy: Secondary | ICD-10-CM | POA: Diagnosis not present

## 2019-11-04 DIAGNOSIS — Z1231 Encounter for screening mammogram for malignant neoplasm of breast: Secondary | ICD-10-CM | POA: Diagnosis not present

## 2019-11-04 DIAGNOSIS — E1122 Type 2 diabetes mellitus with diabetic chronic kidney disease: Secondary | ICD-10-CM | POA: Insufficient documentation

## 2019-11-04 DIAGNOSIS — Z9223 Personal history of estrogen therapy: Secondary | ICD-10-CM | POA: Insufficient documentation

## 2019-11-04 DIAGNOSIS — Z923 Personal history of irradiation: Secondary | ICD-10-CM | POA: Insufficient documentation

## 2019-11-04 DIAGNOSIS — N3281 Overactive bladder: Secondary | ICD-10-CM | POA: Diagnosis not present

## 2019-11-04 LAB — COMPREHENSIVE METABOLIC PANEL
ALT: 13 U/L (ref 0–44)
AST: 16 U/L (ref 15–41)
Albumin: 3.3 g/dL — ABNORMAL LOW (ref 3.5–5.0)
Alkaline Phosphatase: 69 U/L (ref 38–126)
Anion gap: 12 (ref 5–15)
BUN: 47 mg/dL — ABNORMAL HIGH (ref 8–23)
CO2: 26 mmol/L (ref 22–32)
Calcium: 8.7 mg/dL — ABNORMAL LOW (ref 8.9–10.3)
Chloride: 101 mmol/L (ref 98–111)
Creatinine, Ser: 3.41 mg/dL — ABNORMAL HIGH (ref 0.44–1.00)
GFR, Estimated: 13 mL/min — ABNORMAL LOW (ref >60)
Glucose, Bld: 160 mg/dL — ABNORMAL HIGH (ref 70–99)
Potassium: 3.6 mmol/L (ref 3.5–5.1)
Sodium: 139 mmol/L (ref 135–145)
Total Bilirubin: 0.7 mg/dL (ref 0.3–1.2)
Total Protein: 7.2 g/dL (ref 6.5–8.1)

## 2019-11-04 LAB — CBC WITH DIFFERENTIAL/PLATELET
Abs Immature Granulocytes: 0.05 10*3/uL (ref 0.00–0.07)
Basophils Absolute: 0 10*3/uL (ref 0.0–0.1)
Basophils Relative: 0 %
Eosinophils Absolute: 0.5 10*3/uL (ref 0.0–0.5)
Eosinophils Relative: 5 %
HCT: 31.5 % — ABNORMAL LOW (ref 36.0–46.0)
Hemoglobin: 9.8 g/dL — ABNORMAL LOW (ref 12.0–15.0)
Immature Granulocytes: 1 %
Lymphocytes Relative: 18 %
Lymphs Abs: 1.7 10*3/uL (ref 0.7–4.0)
MCH: 28.2 pg (ref 26.0–34.0)
MCHC: 31.1 g/dL (ref 30.0–36.0)
MCV: 90.5 fL (ref 80.0–100.0)
Monocytes Absolute: 0.5 10*3/uL (ref 0.1–1.0)
Monocytes Relative: 5 %
Neutro Abs: 6.9 10*3/uL (ref 1.7–7.7)
Neutrophils Relative %: 71 %
Platelets: 178 10*3/uL (ref 150–400)
RBC: 3.48 MIL/uL — ABNORMAL LOW (ref 3.87–5.11)
RDW: 14.9 % (ref 11.5–15.5)
WBC: 9.7 10*3/uL (ref 4.0–10.5)
nRBC: 0 % (ref 0.0–0.2)

## 2019-11-10 DIAGNOSIS — H903 Sensorineural hearing loss, bilateral: Secondary | ICD-10-CM | POA: Diagnosis not present

## 2019-11-11 ENCOUNTER — Other Ambulatory Visit: Payer: Medicare PPO

## 2019-11-11 ENCOUNTER — Inpatient Hospital Stay (HOSPITAL_COMMUNITY): Payer: Medicare PPO | Admitting: Oncology

## 2019-11-11 VITALS — BP 154/84 | HR 78 | Temp 96.9°F | Resp 20 | Wt 219.1 lb

## 2019-11-11 DIAGNOSIS — N185 Chronic kidney disease, stage 5: Secondary | ICD-10-CM | POA: Diagnosis not present

## 2019-11-11 DIAGNOSIS — Z853 Personal history of malignant neoplasm of breast: Secondary | ICD-10-CM | POA: Diagnosis not present

## 2019-11-11 DIAGNOSIS — D631 Anemia in chronic kidney disease: Secondary | ICD-10-CM | POA: Diagnosis not present

## 2019-11-11 DIAGNOSIS — Z170 Estrogen receptor positive status [ER+]: Secondary | ICD-10-CM

## 2019-11-11 DIAGNOSIS — E559 Vitamin D deficiency, unspecified: Secondary | ICD-10-CM | POA: Diagnosis not present

## 2019-11-11 DIAGNOSIS — R0602 Shortness of breath: Secondary | ICD-10-CM | POA: Diagnosis not present

## 2019-11-11 DIAGNOSIS — I132 Hypertensive heart and chronic kidney disease with heart failure and with stage 5 chronic kidney disease, or end stage renal disease: Secondary | ICD-10-CM | POA: Diagnosis not present

## 2019-11-11 DIAGNOSIS — E1122 Type 2 diabetes mellitus with diabetic chronic kidney disease: Secondary | ICD-10-CM | POA: Diagnosis not present

## 2019-11-11 DIAGNOSIS — E1129 Type 2 diabetes mellitus with other diabetic kidney complication: Secondary | ICD-10-CM | POA: Diagnosis not present

## 2019-11-11 DIAGNOSIS — K5909 Other constipation: Secondary | ICD-10-CM | POA: Diagnosis not present

## 2019-11-11 DIAGNOSIS — N189 Chronic kidney disease, unspecified: Secondary | ICD-10-CM | POA: Diagnosis not present

## 2019-11-11 DIAGNOSIS — R809 Proteinuria, unspecified: Secondary | ICD-10-CM | POA: Diagnosis not present

## 2019-11-11 DIAGNOSIS — C50912 Malignant neoplasm of unspecified site of left female breast: Secondary | ICD-10-CM | POA: Diagnosis not present

## 2019-11-11 DIAGNOSIS — Z9223 Personal history of estrogen therapy: Secondary | ICD-10-CM | POA: Diagnosis not present

## 2019-11-11 DIAGNOSIS — G479 Sleep disorder, unspecified: Secondary | ICD-10-CM | POA: Diagnosis not present

## 2019-11-11 DIAGNOSIS — I770 Arteriovenous fistula, acquired: Secondary | ICD-10-CM | POA: Diagnosis not present

## 2019-11-11 DIAGNOSIS — E211 Secondary hyperparathyroidism, not elsewhere classified: Secondary | ICD-10-CM | POA: Diagnosis not present

## 2019-11-11 DIAGNOSIS — Z923 Personal history of irradiation: Secondary | ICD-10-CM | POA: Diagnosis not present

## 2019-11-11 NOTE — Progress Notes (Signed)
[address] [place], [place] [id number]   CLINIC:  Medical Oncology/Hematology  PCP:  Dettinger, Fransisca Kaufmann, MD [place] [address] [phone]   REASON FOR VISIT:  Follow-up for left breast cancer.  CURRENT THERAPY: Surveillance.  BRIEF ONCOLOGIC HISTORY:  Oncology History   No history exists.     CANCER STAGING: Cancer Staging Stage I left-sided Breast cancer Staging form: Breast, AJCC 7th Edition - Clinical: Stage IA (T1a, N0, cM0) - Signed by Baird Cancer, PA on 12/31/2010    INTERVAL HISTORY:  Anna Dickerson 86 y.o. female who presents for follow-up of left breast cancer. Had a mammogram on 11/08/2019. She denies any B symptoms. No infections or recent hospitalizations. Has chronic bilateral knee pain secondary to knee replacements greater than 15 years ago. Has shortness of breath with exertion. Chronic constipation and occasional sleep problems. She is followed by Dr. Theador Hawthorne for stage V chronic kidney disease secondary to type 2 diabetes. She has follow-up with him next week. Unfortunately, her kidney function continues to worsen and her creatinine is maintaining between 3-4. She was recently referred to a vascular surgeon for fistula evaluation.  REVIEW OF SYSTEMS:  Review of Systems  Respiratory: Positive for shortness of breath.   Cardiovascular: Positive for leg swelling.  Gastrointestinal: Positive for constipation.  Musculoskeletal: Positive for arthralgias.  All other systems reviewed and are negative.    PAST MEDICAL/SURGICAL HISTORY:  Past Medical History:  Diagnosis Date  . B12 deficiency 02/08/2015  . Cancer [facility]) [id number]   breast  . Chronic kidney disease   . Diabetes mellitus   . DM (diabetes mellitus) ([place]) 12/31/2010  . HTN (hypertension) 12/31/2010  . Hypertension   . Iron deficiency 02/24/2012  . Macular degeneration 12/31/2010  . Macular degeneration, left eye   . OAB (overactive bladder)     . Obesity 12/31/2010  . Stage I left-sided Breast cancer 12/31/2010   Past Surgical History:  Procedure Laterality Date  . bilateral cataract with intraocular lens implant    . Bladder tacked-up    . BREAST SURGERY  left    . REPLACEMENT TOTAL KNEE BILATERAL Bilateral    TKR were ar different times about 2 yeras apart.  Marland Kitchen SKIN CANCER EXCISION     squamous cell right ear and calf right leg     SOCIAL HISTORY:  Social History   Socioeconomic History  . Marital status: Widowed    Spouse name: Not on file  . Number of children: 4  . Years of education: Not on file  . Highest education level: High school graduate  Occupational History  . Occupation: Retired    Fish farm manager: [employer]    Comment: Consulting civil engineer  Tobacco Use  . Smoking status: Never Smoker  . Smokeless tobacco: Never Used  Vaping Use  . Vaping Use: Never used  Substance and Sexual Activity  . Alcohol use: No  . Drug use: No  . Sexual activity: Not on file  Other Topics Concern  . Not on file  Social History Narrative  . Not on file   Social Determinants of Health   Financial Resource Strain:   . Difficulty of Paying Living Expenses: Not on file  Food Insecurity:   . Worried About Charity fundraiser in the Last Year: Not on file  . Ran Out of Food in the Last Year: Not on file  Transportation Needs:   . Lack of Transportation (Medical): Not  on file  . Lack of Transportation (Non-Medical): Not on file  Physical Activity:   . Days of Exercise per Week: Not on file  . Minutes of Exercise per Session: Not on file  Stress:   . Feeling of Stress : Not on file  Social Connections:   . Frequency of Communication with Friends and Family: Not on file  . Frequency of Social Gatherings with Friends and Family: Not on file  . Attends Religious Services: Not on file  . Active Member of Clubs or Organizations: Not on file  . Attends Archivist Meetings: Not on file  . Marital  Status: Not on file  Intimate Partner Violence:   . Fear of Current or Ex-Partner: Not on file  . Emotionally Abused: Not on file  . Physically Abused: Not on file  . Sexually Abused: Not on file    FAMILY HISTORY:  Family History  Problem Relation Age of Onset  . Cancer Mother   . Cancer Sister   . Heart attack Brother   . Arthritis Sister   . Anxiety disorder Brother   . Arthritis Sister     CURRENT MEDICATIONS:  Outpatient Encounter Medications as of 11/11/2019  Medication Sig  . Accu-Chek Softclix Lancets lancets Check BS up to 3 times daily Dx E11.40  . amLODipine (NORVASC) 10 MG tablet Take 1 tablet (10 mg total) by mouth daily.  Marland Kitchen aspirin 81 MG tablet Take 81 mg by mouth daily.    . Blood Glucose Monitoring Suppl (ACCU-CHEK AVIVA PLUS) w/Device KIT Check BS up to three times daily  . calcium carbonate (OSCAL) 1500 (600 Ca) MG TABS tablet Take 600 mg of elemental calcium by mouth daily with breakfast.  . carvedilol (COREG) 25 MG tablet Take 1 tablet (25 mg total) by mouth 2 (two) times daily with a meal.  . cephALEXin (KEFLEX) 500 MG capsule Take 1 capsule (500 mg total) by mouth 4 (four) times daily.  . cholecalciferol (VITAMIN D3) 25 MCG (1000 UNIT) tablet Take 1,000 Units by mouth daily.  . Dulaglutide (TRULICITY) 8.59 YT/2.4MQ SOPN Inject 0.75 mg into the skin once a week.  . fluticasone (FLONASE) 50 MCG/ACT nasal spray Place 1 spray into both nostrils 2 (two) times daily as needed for allergies or rhinitis.  Marland Kitchen glucose blood (ACCU-CHEK GUIDE) test strip Check BS up to 3 times daily Dx E11.40  . hydrALAZINE (APRESOLINE) 50 MG tablet Take 50 mg by mouth 2 (two) times daily.  Marland Kitchen LINZESS 72 MCG capsule TAKE 1 CAPSULE DAILY BEFORE BREAKFAST  . loratadine (CLARITIN) 10 MG tablet Take 10 mg by mouth daily as needed for allergies.  Marland Kitchen LORazepam (ATIVAN) 0.5 MG tablet Take 1 tablet (0.5 mg total) by mouth at bedtime.  . Melatonin 5 MG TABS Take 1.5 tablets (7.5 mg total) by mouth  at bedtime. (Patient taking differently: Take 5 mg by mouth at bedtime. )  . MELATONIN/VITAMIN B-6 EX ST 5-1 MG TABS TAKE ONE TABLET AT BEDTIME  . Multiple Vitamins-Minerals (PRESERVISION AREDS 2) CAPS Take 1 capsule by mouth 2 (two) times daily.   . Omega-3 Fatty Acids (FISH OIL) 1200 MG CAPS Take 1,200 mg by mouth in the morning and at bedtime.  . rosuvastatin (CRESTOR) 5 MG tablet Take 1 tablet (5 mg total) by mouth 3 (three) times a week.  . torsemide (DEMADEX) 100 MG tablet Take 100 mg by mouth daily.  Marland Kitchen venlafaxine XR (EFFEXOR-XR) 150 MG 24 hr capsule Take 1 capsule (150 mg  total) by mouth daily with breakfast.  . Vitamin D, Ergocalciferol, (DRISDOL) 50000 units CAPS capsule TAKE 1 CAPSULE ONCE A WEEK  . [DISCONTINUED] calcium acetate (PHOSLO) 667 MG capsule Take 667 mg by mouth daily.  . [DISCONTINUED] hydrALAZINE (APRESOLINE) 25 MG tablet Take 25-50 mg by mouth See admin instructions. 25 mg in the morning, 50 mg and the evening   No facility-administered encounter medications on file as of 11/11/2019.    ALLERGIES:  Allergies  Allergen Reactions  . Sulfa Antibiotics     Mouth ulcers  . Statins     Pravastatin, atorvastatin --MYOPATHY     PHYSICAL EXAM:  ECOG Performance status: 2  Vitals:   11/11/19 1134  BP: (!) 154/84  Pulse: 78  Resp: 20  Temp: (!) 96.9 F (36.1 C)  SpO2: 98%   Filed Weights   11/11/19 1134  Weight: 219 lb 2.2 oz (99.4 kg)    Physical Exam Vitals reviewed.  Constitutional:      Appearance: Normal appearance.  Cardiovascular:     Rate and Rhythm: Normal rate and regular rhythm.     Heart sounds: Normal heart sounds.  Pulmonary:     Effort: Pulmonary effort is normal.     Breath sounds: Normal breath sounds.  Abdominal:     General: There is no distension.     Palpations: Abdomen is soft. There is no mass.  Musculoskeletal:        General: No swelling.  Skin:    General: Skin is warm.  Neurological:     General: No focal deficit  present.     Mental Status: She is alert and oriented to person, place, and time.  Psychiatric:        Mood and Affect: Mood normal.        Behavior: Behavior normal.    Left breast lumpectomy site is within normal limits.  No palpable mass in bilateral eyes.  No axillary adenopathy.  LABORATORY DATA:  I have reviewed the labs as listed.  CBC    Component Value Date/Time   WBC 9.7 11/04/2019 0951   RBC 3.48 (L) 11/04/2019 0951   HGB 9.8 (L) 11/04/2019 0951   HGB 9.9 (L) 04/25/2019 1438   HCT 31.5 (L) 11/04/2019 0951   HCT 30.1 (L) 04/25/2019 1438   PLT 178 11/04/2019 0951   PLT 185 04/25/2019 1438   MCV 90.5 11/04/2019 0951   MCV 88 04/25/2019 1438   MCH 28.2 11/04/2019 0951   MCHC 31.1 11/04/2019 0951   RDW 14.9 11/04/2019 0951   RDW 14.2 04/25/2019 1438   LYMPHSABS 1.7 11/04/2019 0951   LYMPHSABS 2.5 04/25/2019 1438   MONOABS 0.5 11/04/2019 0951   EOSABS 0.5 11/04/2019 0951   EOSABS 0.5 (H) 04/25/2019 1438   BASOSABS 0.0 11/04/2019 0951   BASOSABS 0.0 04/25/2019 1438   CMP Latest Ref Rng & Units 11/04/2019 10/27/2019 07/27/2019  Glucose 70 - 99 mg/dL 160(H) 183(H) 159(H)  BUN 8 - 23 mg/dL 47(H) 43(H) 45(H)  Creatinine 0.44 - 1.00 mg/dL 3.41(H) 3.13(HH) 3.22(HH)  Sodium 135 - 145 mmol/L 139 141 140  Potassium 3.5 - 5.1 mmol/L 3.6 3.6 3.7  Chloride 98 - 111 mmol/L 101 101 101  CO2 22 - 32 mmol/L _0 Calcium 8.9 - 10.3 mg/dL 8.7(L) 8.6(L) 8.8  Total Protein 6.5 - 8.1 g/dL 7.2 7.0 7.0  Total Bilirubin 0.3 - 1.2 mg/dL 0.7 <0.2 0.2  Alkaline Phos 38 - 126 U/L 69 87 85  AST  15 - 41 U/L _0 ALT 0 - 44 U/L _1 DIAGNOSTIC IMAGING:  I have independently reviewed the scans and discussed with the patient.  ASSESSMENT & PLAN:  1.  Stage I (T1 a N0 M0) left breast invasive ductal carcinoma: - Status post lumpectomy in August 2007, 4 mm IDC, ER/PR positive, HER-2 negative, Ki-67 8%, high-grade DCIS. -Status post XRT to the left breast. -Status  post 5 years of tamoxifen completed in 2012. - Physical examination today did not reveal any palpable masses.  No palpable adenopathy. - I have reviewed bilateral mammograms dated 11/04/2019 which was BI-RADS Category 1 negative. -She will come back in 1 year for follow-up, labs (CBC, CMP) with repeat mammogram.  2.  CKD: -Her creatinine has gotten worse at 3.48. - She has follow-up visit with Dr. Theador Hawthorne next week. -She is also been referred to vascular for AV fistula in anticipation to begin dialysis at some point. -She will need AV graft  3. CHF: -Decompensated-with recent weight gain -Currently on torsemide 100 mg in the morning and 50 mg at night.   No problem-specific Assessment & Plan notes found for this encounter.    Greater than 50% was spent in counseling and coordination of care with this patient including but not limited to discussion of the relevant topics above (See A&P) including, but not limited to diagnosis and management of acute and chronic medical conditions.    Orders placed this encounter:  Orders Placed This Encounter  Procedures  . MM DIGITAL SCREENING BILATERAL  . CBC with Differential/Platelet  . Comprehensive metabolic panel   Anna Casa, NP 11/11/2019 2:31 PM  [place] [phone]

## 2019-11-16 ENCOUNTER — Other Ambulatory Visit: Payer: Self-pay | Admitting: Family Medicine

## 2019-11-18 DIAGNOSIS — I5032 Chronic diastolic (congestive) heart failure: Secondary | ICD-10-CM | POA: Diagnosis not present

## 2019-11-18 DIAGNOSIS — E211 Secondary hyperparathyroidism, not elsewhere classified: Secondary | ICD-10-CM | POA: Diagnosis not present

## 2019-11-18 DIAGNOSIS — E1129 Type 2 diabetes mellitus with other diabetic kidney complication: Secondary | ICD-10-CM | POA: Diagnosis not present

## 2019-11-18 DIAGNOSIS — R809 Proteinuria, unspecified: Secondary | ICD-10-CM | POA: Diagnosis not present

## 2019-11-18 DIAGNOSIS — N189 Chronic kidney disease, unspecified: Secondary | ICD-10-CM | POA: Diagnosis not present

## 2019-11-18 DIAGNOSIS — E1122 Type 2 diabetes mellitus with diabetic chronic kidney disease: Secondary | ICD-10-CM | POA: Diagnosis not present

## 2019-11-18 DIAGNOSIS — E559 Vitamin D deficiency, unspecified: Secondary | ICD-10-CM | POA: Diagnosis not present

## 2019-11-18 DIAGNOSIS — D631 Anemia in chronic kidney disease: Secondary | ICD-10-CM | POA: Diagnosis not present

## 2019-11-18 DIAGNOSIS — N185 Chronic kidney disease, stage 5: Secondary | ICD-10-CM | POA: Diagnosis not present

## 2019-11-29 ENCOUNTER — Other Ambulatory Visit: Payer: Self-pay | Admitting: Family Medicine

## 2019-11-29 DIAGNOSIS — K5909 Other constipation: Secondary | ICD-10-CM

## 2019-12-12 ENCOUNTER — Other Ambulatory Visit: Payer: Self-pay | Admitting: Family Medicine

## 2019-12-12 ENCOUNTER — Encounter: Payer: Self-pay | Admitting: Family Medicine

## 2019-12-15 ENCOUNTER — Ambulatory Visit: Payer: Medicare PPO | Admitting: Family Medicine

## 2019-12-15 ENCOUNTER — Ambulatory Visit (INDEPENDENT_AMBULATORY_CARE_PROVIDER_SITE_OTHER): Payer: Medicare PPO

## 2019-12-15 ENCOUNTER — Encounter: Payer: Self-pay | Admitting: Family Medicine

## 2019-12-15 ENCOUNTER — Other Ambulatory Visit: Payer: Self-pay

## 2019-12-15 VITALS — BP 149/62 | HR 82 | Temp 98.0°F | Ht 64.0 in | Wt 223.0 lb

## 2019-12-15 DIAGNOSIS — I517 Cardiomegaly: Secondary | ICD-10-CM | POA: Diagnosis not present

## 2019-12-15 DIAGNOSIS — R600 Localized edema: Secondary | ICD-10-CM | POA: Diagnosis not present

## 2019-12-15 DIAGNOSIS — R0602 Shortness of breath: Secondary | ICD-10-CM

## 2019-12-15 DIAGNOSIS — J90 Pleural effusion, not elsewhere classified: Secondary | ICD-10-CM | POA: Diagnosis not present

## 2019-12-15 MED ORDER — ALBUTEROL SULFATE HFA 108 (90 BASE) MCG/ACT IN AERS: 2.0000 | INHALATION_SPRAY | Freq: Four times a day (QID) | RESPIRATORY_TRACT | 0 refills | Status: AC | PRN

## 2019-12-15 NOTE — Patient Instructions (Signed)
°  Patient was instructed to take an extra dose of torsemide for 2 days and if still having issues then we are going to repeat the echocardiogram.

## 2019-12-15 NOTE — Addendum Note (Signed)
Addended by: Caryl Pina on: 12/15/2019 04:13 PM   Modules accepted: Orders

## 2019-12-15 NOTE — Progress Notes (Signed)
BP (!) 149/62   Pulse 82   Temp 98 F (36.7 C)   Ht 5\' 4"  (1.626 m)   Wt 223 lb (101.2 kg)   SpO2 98%   BMI 38.28 kg/m    Subjective:   Patient ID: Anna Dickerson, female    DOB: 1929-06-24, 86 y.o.   MRN: [id number]  HPI: Anna Dickerson is a 86 y.o. female presenting on 12/15/2019 for Edema (LLE) and Shortness of Breath (started on Monday. happens with exertion)   HPI Is coming in complaining of bilateral lower extremity edema that she has been having for a while but now she is also having some shortness of breath which is worse on exertion been going on over the past four or 5 days.  Feels like she is having more more trouble with breathing.  She is still taking her diuretics and still sees a nephrologist for her kidney issues.  She is takes torsemide 100 mg daily.  Relevant past medical, surgical, family and social history reviewed and updated as indicated. Interim medical history since our last visit reviewed. Allergies and medications reviewed and updated.  Review of Systems  Constitutional: Negative for chills and fever.  Eyes: Negative for visual disturbance.  Respiratory: Positive for shortness of breath. Negative for chest tightness.   Cardiovascular: Positive for leg swelling. Negative for chest pain.  Musculoskeletal: Negative for back pain and gait problem.  Skin: Negative for rash.  Neurological: Negative for light-headedness and headaches.  Psychiatric/Behavioral: Negative for agitation and behavioral problems.  All other systems reviewed and are negative.   Per HPI unless specifically indicated above   Allergies as of 12/15/2019      Reactions   Sulfa Antibiotics    Mouth ulcers   Statins    Pravastatin, atorvastatin --MYOPATHY      Medication List       Accurate as of December 15, 2019  3:36 PM. If you have any questions, ask your nurse or doctor.        STOP taking these medications   Accu-Chek Softclix Lancets lancets Stopped by:  Elige Radon Nandi Tonnesen, MD   cephALEXin 500 MG capsule Commonly known as: KEFLEX Stopped by: Elige Radon Arriel Victor, MD   cholecalciferol 25 MCG (1000 UNIT) tablet Commonly known as: VITAMIN D3 Stopped by: Elige Radon Verline Kong, MD   Vitamin D (Ergocalciferol) 1.25 MG (50000 UNIT) Caps capsule Commonly known as: DRISDOL Stopped by: Elige Radon Oveda Dadamo, MD     TAKE these medications   Accu-Chek Aviva Plus w/Device Kit Check BS up to three times daily   Accu-Chek Guide test strip Generic drug: glucose blood Check BS up to 3 times daily Dx E11.40   amLODipine 10 MG tablet Commonly known as: NORVASC Take 1 tablet (10 mg total) by mouth daily.   aspirin 81 MG tablet Take 81 mg by mouth daily.   calcium carbonate 1500 (600 Ca) MG Tabs tablet Commonly known as: OSCAL Take 600 mg of elemental calcium by mouth daily with breakfast.   carvedilol 25 MG tablet Commonly known as: COREG Take 1 tablet (25 mg total) by mouth 2 (two) times daily with a meal.   Fish Oil 1200 MG Caps Take 1,200 mg by mouth in the morning and at bedtime.   fluticasone 50 MCG/ACT nasal spray Commonly known as: FLONASE Place 1 spray into both nostrils 2 (two) times daily as needed for allergies or rhinitis.   hydrALAZINE 50 MG tablet Commonly known as: APRESOLINE Take 50 mg  by mouth 2 (two) times daily.   Linzess 72 MCG capsule Generic drug: linaclotide TAKE 1 CAPSULE DAILY BEFORE BREAKFAST   loratadine 10 MG tablet Commonly known as: CLARITIN Take 10 mg by mouth daily as needed for allergies.   LORazepam 0.5 MG tablet Commonly known as: ATIVAN Take 1 tablet (0.5 mg total) by mouth at bedtime.   melatonin 5 MG Tabs Take 1.5 tablets (7.5 mg total) by mouth at bedtime. What changed: how much to take   Melatonin/Vitamin B-6 Ex St 5-1 MG Tabs Generic drug: Melatonin-Pyridoxine TAKE ONE TABLET AT BEDTIME   PreserVision AREDS 2 Caps Take 1 capsule by mouth 2 (two) times daily.   rosuvastatin 5 MG  tablet Commonly known as: Crestor Take 1 tablet (5 mg total) by mouth 3 (three) times a week.   torsemide 100 MG tablet Commonly known as: DEMADEX Take 100 mg by mouth daily.   Trulicity 0.75 MG/0.5ML Sopn Generic drug: Dulaglutide Inject 0.75 mg into the skin once a week.   venlafaxine XR 150 MG 24 hr capsule Commonly known as: EFFEXOR-XR Take 1 capsule (150 mg total) by mouth daily with breakfast.        Objective:   BP (!) 149/62   Pulse 82   Temp 98 F (36.7 C)   Ht 5\' 4"  (1.626 m)   Wt 223 lb (101.2 kg)   SpO2 98%   BMI 38.28 kg/m   Wt Readings from Last 3 Encounters:  12/15/19 223 lb (101.2 kg)  11/11/19 219 lb 2.2 oz (99.4 kg)  10/27/19 220 lb 8 oz (100 kg)    Physical Exam Vitals and nursing note reviewed.  Constitutional:      General: She is not in acute distress.    Appearance: She is well-developed. She is not diaphoretic.  Eyes:     Conjunctiva/sclera: Conjunctivae normal.     Pupils: Pupils are equal, round, and reactive to light.  Cardiovascular:     Rate and Rhythm: Normal rate and regular rhythm.     Heart sounds: Normal heart sounds. No murmur heard.   Pulmonary:     Effort: Pulmonary effort is normal. No respiratory distress.     Breath sounds: Rales (biBasilar crackles) present. No wheezing.  Musculoskeletal:        General: No tenderness. Normal range of motion.  Skin:    General: Skin is warm and dry.     Findings: No rash.  Neurological:     Mental Status: She is alert and oriented to person, place, and time.     Coordination: Coordination normal.  Psychiatric:        Behavior: Behavior normal.     Chest x-ray: Patient does not show right basilar crackles on x-ray, no fluid although she does have flattened diaphragm, await for final read on from radiology  Assessment & Plan:   Problem List Items Addressed This Visit    None    Visit Diagnoses    Localized edema    -  Primary   Relevant Orders   DG Chest 2 View   CBC  with Differential/Platelet   CMP14+EGFR   Short of breath on exertion       Relevant Orders   DG Chest 2 View   CBC with Differential/Platelet   CMP14+EGFR      Patient was instructed to take an extra dose of torsemide for 2 days and if still having issues then we are going to repeat the echocardiogram.  We will also  check blood counts for CBC and chemistry panel to recheck kidney function. Follow up plan: Return in about 3 months (around 03/14/2020), or if symptoms worsen or fail to improve.  Counseling provided for all of the vaccine components No orders of the defined types were placed in this encounter.   Arville Care, MD [facility] 12/15/2019, 3:36 PM

## 2019-12-16 LAB — CBC WITH DIFFERENTIAL/PLATELET
Basophils Absolute: 0.1 10*3/uL (ref 0.0–0.2)
Basos: 1 %
EOS (ABSOLUTE): 0.4 10*3/uL (ref 0.0–0.4)
Eos: 4 %
Hematocrit: 27.5 % — ABNORMAL LOW (ref 34.0–46.6)
Hemoglobin: 9 g/dL — ABNORMAL LOW (ref 11.1–15.9)
Immature Grans (Abs): 0.1 10*3/uL (ref 0.0–0.1)
Immature Granulocytes: 1 %
Lymphocytes Absolute: 1.9 10*3/uL (ref 0.7–3.1)
Lymphs: 21 %
MCH: 28.2 pg (ref 26.6–33.0)
MCHC: 32.7 g/dL (ref 31.5–35.7)
MCV: 86 fL (ref 79–97)
Monocytes Absolute: 0.8 10*3/uL (ref 0.1–0.9)
Monocytes: 9 %
Neutrophils Absolute: 5.8 10*3/uL (ref 1.4–7.0)
Neutrophils: 64 %
Platelets: 209 10*3/uL (ref 150–450)
RBC: 3.19 x10E6/uL — ABNORMAL LOW (ref 3.77–5.28)
RDW: 14.4 % (ref 11.7–15.4)
WBC: 9 10*3/uL (ref 3.4–10.8)

## 2019-12-16 LAB — CMP14+EGFR
ALT: 13 IU/L (ref 0–32)
AST: 13 IU/L (ref 0–40)
Albumin/Globulin Ratio: 1.3 (ref 1.2–2.2)
Albumin: 4 g/dL (ref 3.6–4.6)
Alkaline Phosphatase: 90 IU/L (ref 44–121)
BUN/Creatinine Ratio: 15 (ref 12–28)
BUN: 53 mg/dL — ABNORMAL HIGH (ref 8–27)
Bilirubin Total: <0.2 mg/dL (ref 0.0–1.2)
CO2: 24 mmol/L (ref 20–29)
Calcium: 8.7 mg/dL (ref 8.7–10.3)
Chloride: 104 mmol/L (ref 96–106)
Creatinine, Ser: 3.57 mg/dL (ref 0.57–1.00)
GFR calc Af Amer: 12 mL/min/{1.73_m2} — ABNORMAL LOW (ref >59)
GFR calc non Af Amer: 11 mL/min/{1.73_m2} — ABNORMAL LOW (ref >59)
Globulin, Total: 3.2 g/dL (ref 1.5–4.5)
Glucose: 101 mg/dL — ABNORMAL HIGH (ref 65–99)
Potassium: 4.2 mmol/L (ref 3.5–5.2)
Sodium: 143 mmol/L (ref 134–144)
Total Protein: 7.2 g/dL (ref 6.0–8.5)

## 2019-12-17 ENCOUNTER — Encounter: Payer: Self-pay | Admitting: Family Medicine

## 2019-12-19 ENCOUNTER — Encounter: Payer: Self-pay | Admitting: Family Medicine

## 2019-12-20 ENCOUNTER — Telehealth: Payer: Self-pay

## 2019-12-20 NOTE — Telephone Encounter (Signed)
Anna Dickerson informed that application form has been completed. She will come by tomorrow and pick up. Envelope placed up front under pts last name.

## 2019-12-23 ENCOUNTER — Other Ambulatory Visit: Payer: Self-pay

## 2019-12-23 ENCOUNTER — Other Ambulatory Visit: Payer: Medicare PPO

## 2019-12-23 DIAGNOSIS — N179 Acute kidney failure, unspecified: Secondary | ICD-10-CM | POA: Diagnosis not present

## 2019-12-28 ENCOUNTER — Telehealth: Payer: Self-pay | Admitting: Pharmacist

## 2019-12-28 NOTE — Telephone Encounter (Signed)
Spoke with patient's daughter Anna Dickerson) who stated they brought Anna Dickerson application to Dr. Warrick Parisian who signed and she mailed to [facility] patient assistance for [phone] (trulicity).  No further f/u from PharmD for med assistance.  Medication to ship to patient's home.  Patient to call [phone] for [place] needs and to set up shipment.  Encouraged patient/daughter to call if needs arise.

## 2019-12-29 DIAGNOSIS — E1165 Type 2 diabetes mellitus with hyperglycemia: Secondary | ICD-10-CM | POA: Diagnosis not present

## 2019-12-29 DIAGNOSIS — S2249XA Multiple fractures of ribs, unspecified side, initial encounter for closed fracture: Secondary | ICD-10-CM | POA: Diagnosis not present

## 2019-12-29 DIAGNOSIS — Z9911 Dependence on respirator [ventilator] status: Secondary | ICD-10-CM | POA: Diagnosis not present

## 2019-12-29 DIAGNOSIS — R102 Pelvic and perineal pain: Secondary | ICD-10-CM | POA: Diagnosis not present

## 2019-12-29 DIAGNOSIS — I469 Cardiac arrest, cause unspecified: Secondary | ICD-10-CM | POA: Diagnosis not present

## 2019-12-29 DIAGNOSIS — S37051A Moderate laceration of right kidney, initial encounter: Secondary | ICD-10-CM | POA: Diagnosis not present

## 2019-12-29 DIAGNOSIS — S270XXA Traumatic pneumothorax, initial encounter: Secondary | ICD-10-CM | POA: Diagnosis not present

## 2019-12-29 DIAGNOSIS — I499 Cardiac arrhythmia, unspecified: Secondary | ICD-10-CM | POA: Diagnosis not present

## 2019-12-29 DIAGNOSIS — R001 Bradycardia, unspecified: Secondary | ICD-10-CM | POA: Diagnosis not present

## 2019-12-29 DIAGNOSIS — R404 Transient alteration of awareness: Secondary | ICD-10-CM | POA: Diagnosis not present

## 2019-12-29 DIAGNOSIS — S0990XA Unspecified injury of head, initial encounter: Secondary | ICD-10-CM | POA: Diagnosis not present

## 2019-12-29 DIAGNOSIS — S32028A Other fracture of second lumbar vertebra, initial encounter for closed fracture: Secondary | ICD-10-CM | POA: Diagnosis not present

## 2019-12-29 DIAGNOSIS — S36039A Unspecified laceration of spleen, initial encounter: Secondary | ICD-10-CM | POA: Diagnosis not present

## 2019-12-29 DIAGNOSIS — R079 Chest pain, unspecified: Secondary | ICD-10-CM | POA: Diagnosis not present

## 2019-12-29 DIAGNOSIS — R109 Unspecified abdominal pain: Secondary | ICD-10-CM | POA: Diagnosis not present

## 2019-12-29 DIAGNOSIS — S37031A Laceration of right kidney, unspecified degree, initial encounter: Secondary | ICD-10-CM | POA: Diagnosis not present

## 2019-12-29 DIAGNOSIS — R402111 Coma scale, eyes open, never, in the field [EMT or ambulance]: Secondary | ICD-10-CM | POA: Diagnosis not present

## 2019-12-29 DIAGNOSIS — R402311 Coma scale, best motor response, none, in the field [EMT or ambulance]: Secondary | ICD-10-CM | POA: Diagnosis not present

## 2019-12-29 DIAGNOSIS — J810 Acute pulmonary edema: Secondary | ICD-10-CM | POA: Diagnosis not present

## 2019-12-29 DIAGNOSIS — S301XXA Contusion of abdominal wall, initial encounter: Secondary | ICD-10-CM | POA: Diagnosis not present

## 2019-12-29 DIAGNOSIS — S36892A Contusion of other intra-abdominal organs, initial encounter: Secondary | ICD-10-CM | POA: Diagnosis not present

## 2019-12-29 DIAGNOSIS — R578 Other shock: Secondary | ICD-10-CM | POA: Diagnosis not present

## 2019-12-29 DIAGNOSIS — S2242XA Multiple fractures of ribs, left side, initial encounter for closed fracture: Secondary | ICD-10-CM | POA: Diagnosis not present

## 2019-12-29 DIAGNOSIS — Y92410 Unspecified street and highway as the place of occurrence of the external cause: Secondary | ICD-10-CM | POA: Diagnosis not present

## 2019-12-29 DIAGNOSIS — R402211 Coma scale, best verbal response, none, in the field [EMT or ambulance]: Secondary | ICD-10-CM | POA: Diagnosis not present

## 2019-12-29 DIAGNOSIS — Z743 Need for continuous supervision: Secondary | ICD-10-CM | POA: Diagnosis not present

## 2019-12-29 DIAGNOSIS — Z20822 Contact with and (suspected) exposure to covid-19: Secondary | ICD-10-CM | POA: Diagnosis not present

## 2019-12-29 DIAGNOSIS — S37001A Unspecified injury of right kidney, initial encounter: Secondary | ICD-10-CM | POA: Diagnosis not present

## 2019-12-29 DIAGNOSIS — M87851 Other osteonecrosis, right femur: Secondary | ICD-10-CM | POA: Diagnosis not present

## 2019-12-29 DIAGNOSIS — M542 Cervicalgia: Secondary | ICD-10-CM | POA: Diagnosis not present

## 2019-12-29 DIAGNOSIS — S2241XA Multiple fractures of ribs, right side, initial encounter for closed fracture: Secondary | ICD-10-CM | POA: Diagnosis not present

## 2019-12-29 DIAGNOSIS — Y999 Unspecified external cause status: Secondary | ICD-10-CM | POA: Diagnosis not present

## 2019-12-29 DIAGNOSIS — S27322A Contusion of lung, bilateral, initial encounter: Secondary | ICD-10-CM | POA: Diagnosis not present

## 2019-12-29 DIAGNOSIS — S069X9A Unspecified intracranial injury with loss of consciousness of unspecified duration, initial encounter: Secondary | ICD-10-CM | POA: Diagnosis not present

## 2019-12-29 DIAGNOSIS — M87852 Other osteonecrosis, left femur: Secondary | ICD-10-CM | POA: Diagnosis not present

## 2019-12-29 DIAGNOSIS — R6889 Other general symptoms and signs: Secondary | ICD-10-CM | POA: Diagnosis not present

## 2019-12-29 DIAGNOSIS — S2243XA Multiple fractures of ribs, bilateral, initial encounter for closed fracture: Secondary | ICD-10-CM | POA: Diagnosis not present

## 2019-12-29 DIAGNOSIS — S32018A Other fracture of first lumbar vertebra, initial encounter for closed fracture: Secondary | ICD-10-CM | POA: Diagnosis not present

## 2019-12-29 DIAGNOSIS — S199XXA Unspecified injury of neck, initial encounter: Secondary | ICD-10-CM | POA: Diagnosis not present

## 2019-12-29 DIAGNOSIS — T794XXA Traumatic shock, initial encounter: Secondary | ICD-10-CM | POA: Diagnosis not present

## 2020-01-14 DEATH — deceased

## 2020-01-30 ENCOUNTER — Ambulatory Visit: Payer: Medicare PPO | Admitting: Family Medicine

## 2020-11-05 ENCOUNTER — Ambulatory Visit (HOSPITAL_COMMUNITY): Payer: Medicare PPO

## 2020-11-05 ENCOUNTER — Other Ambulatory Visit (HOSPITAL_COMMUNITY): Payer: Medicare PPO

## 2020-11-14 ENCOUNTER — Other Ambulatory Visit (HOSPITAL_COMMUNITY): Payer: Medicare PPO

## 2020-11-21 ENCOUNTER — Ambulatory Visit (HOSPITAL_COMMUNITY): Payer: Medicare PPO | Admitting: Hematology

## 2021-10-01 IMAGING — DX DG CHEST 2V
3 series · 3 of 3 positions shown · non-contrast
Comparison: 1868

CLINICAL DATA: Increasing shortness of breath on exertion

EXAM:
CHEST - 2 VIEW

[chest pa]
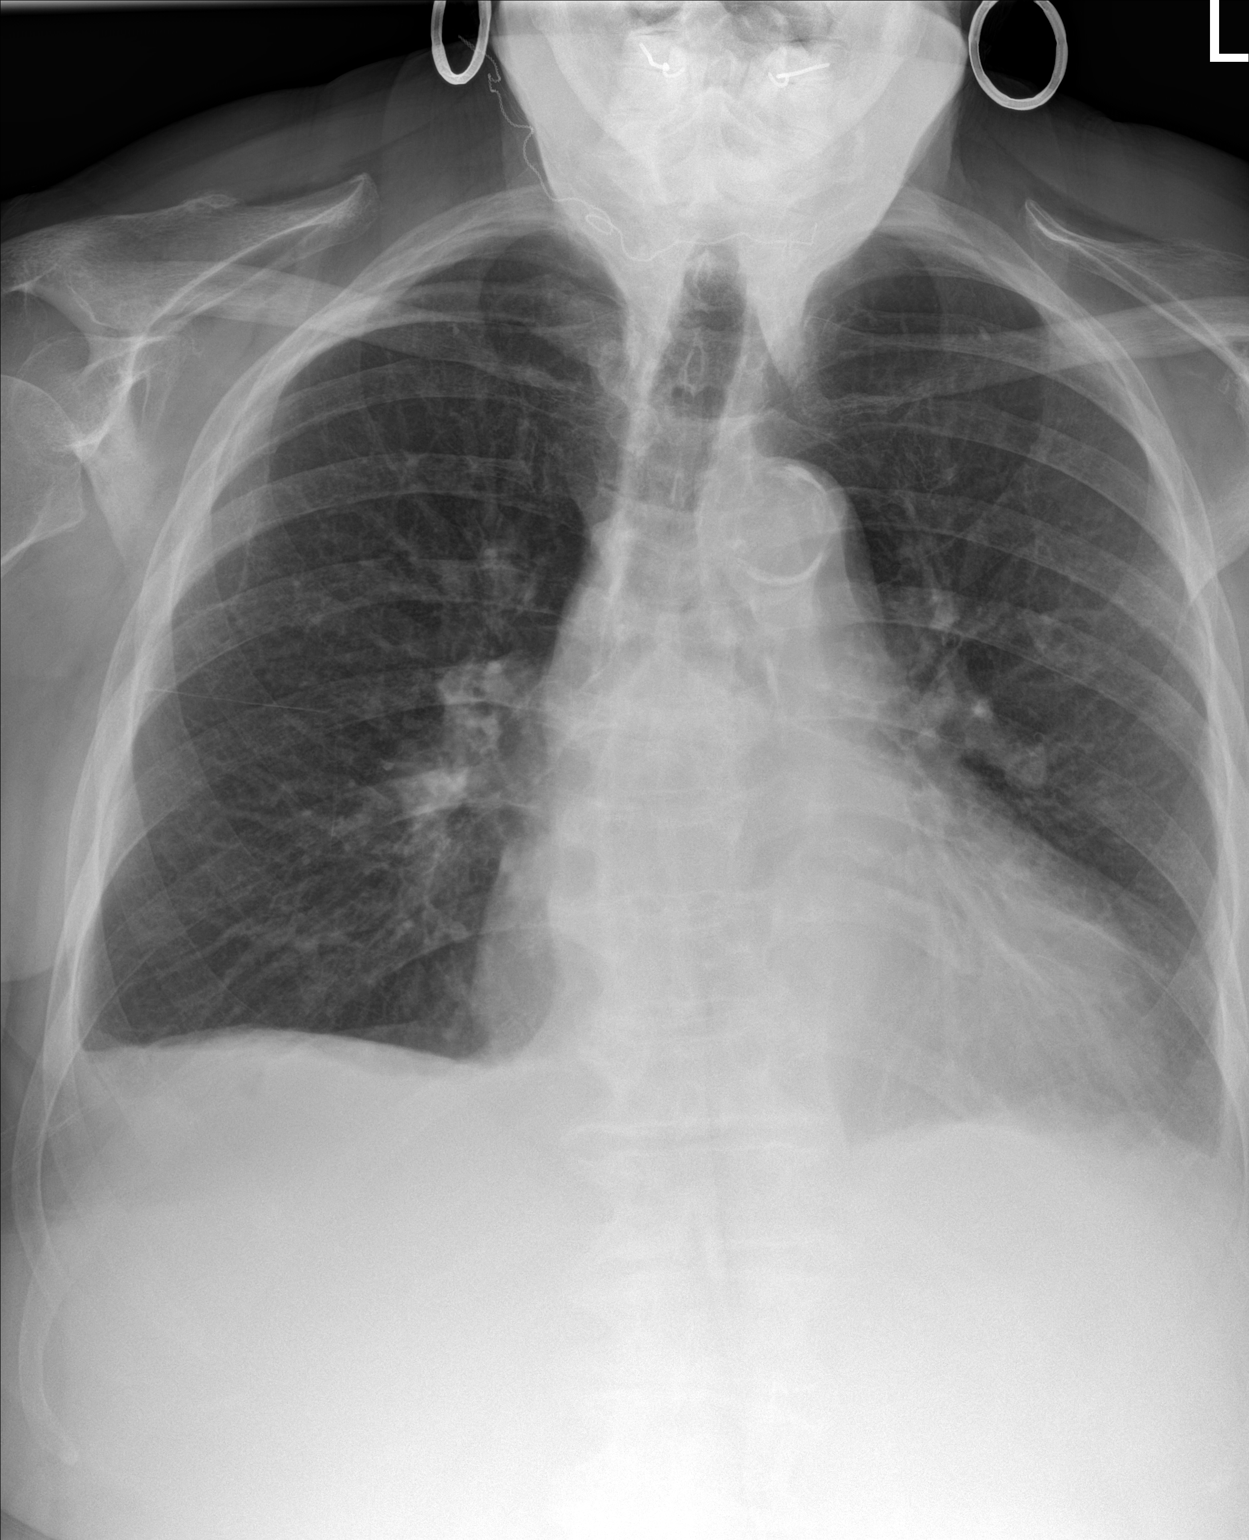

[chest lat (1 of 2)]
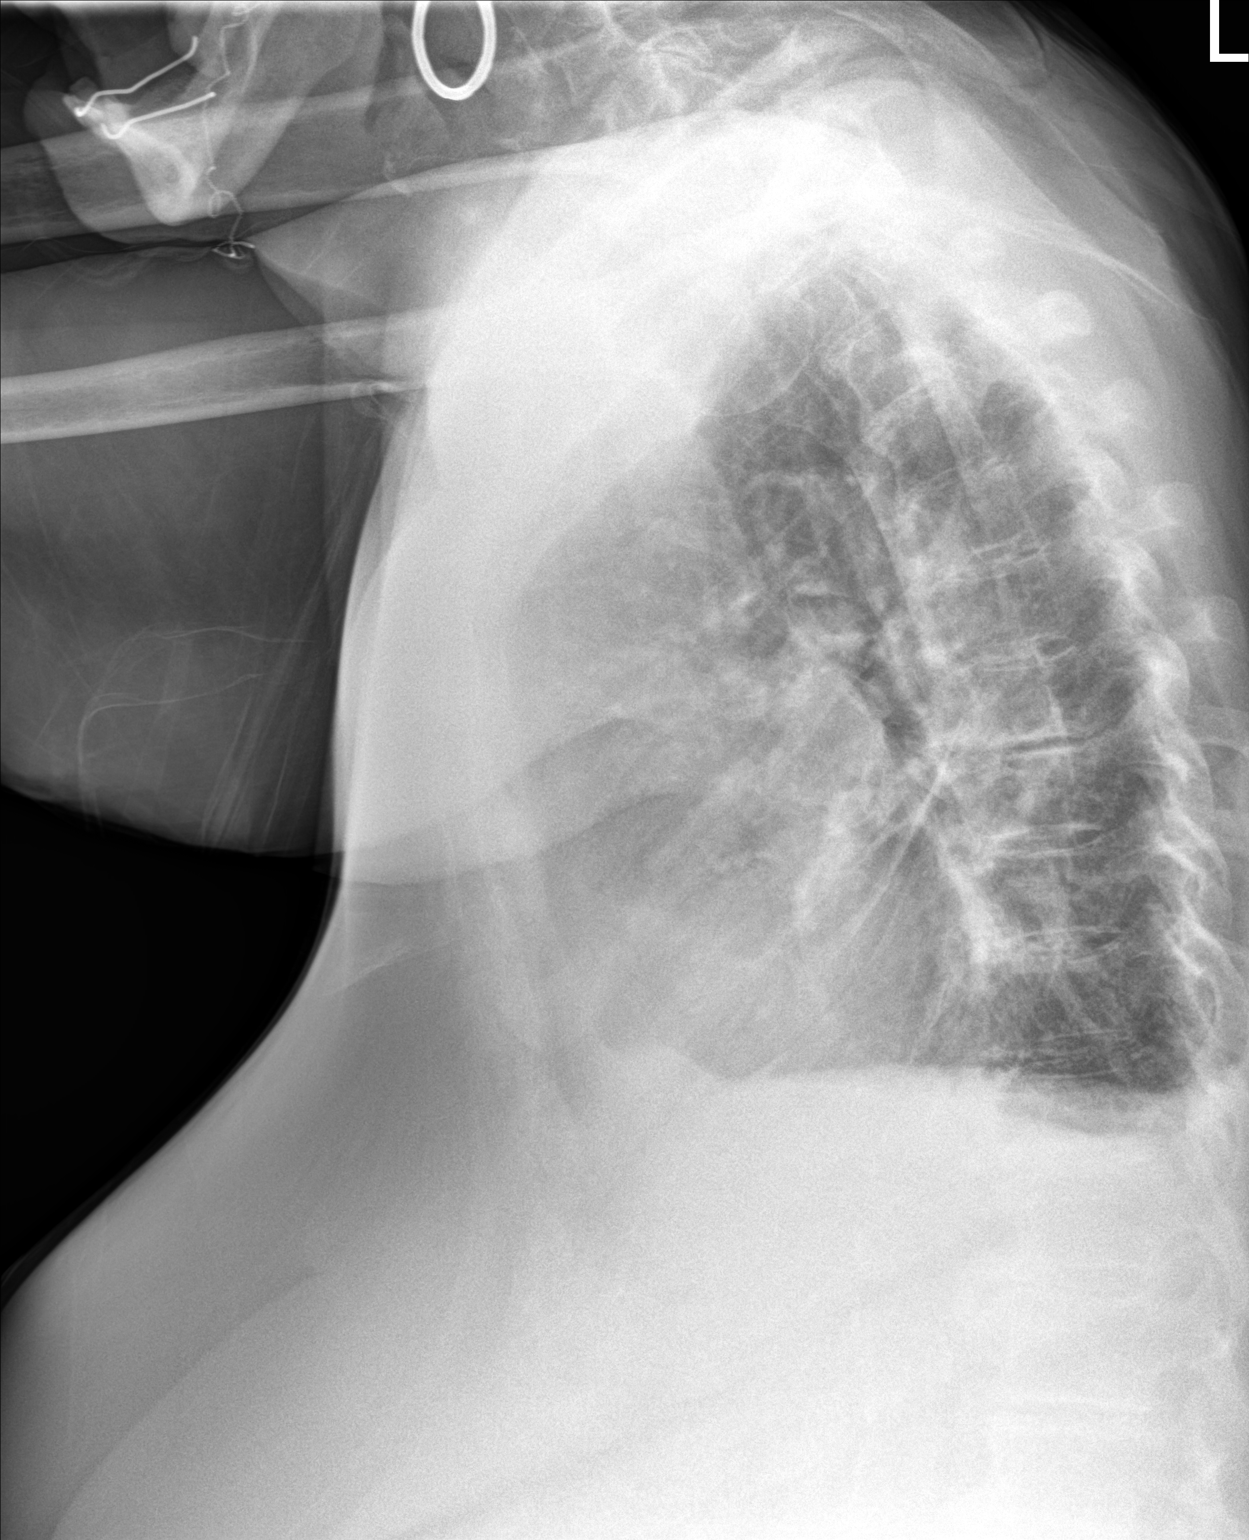

[chest lat (2 of 2)]
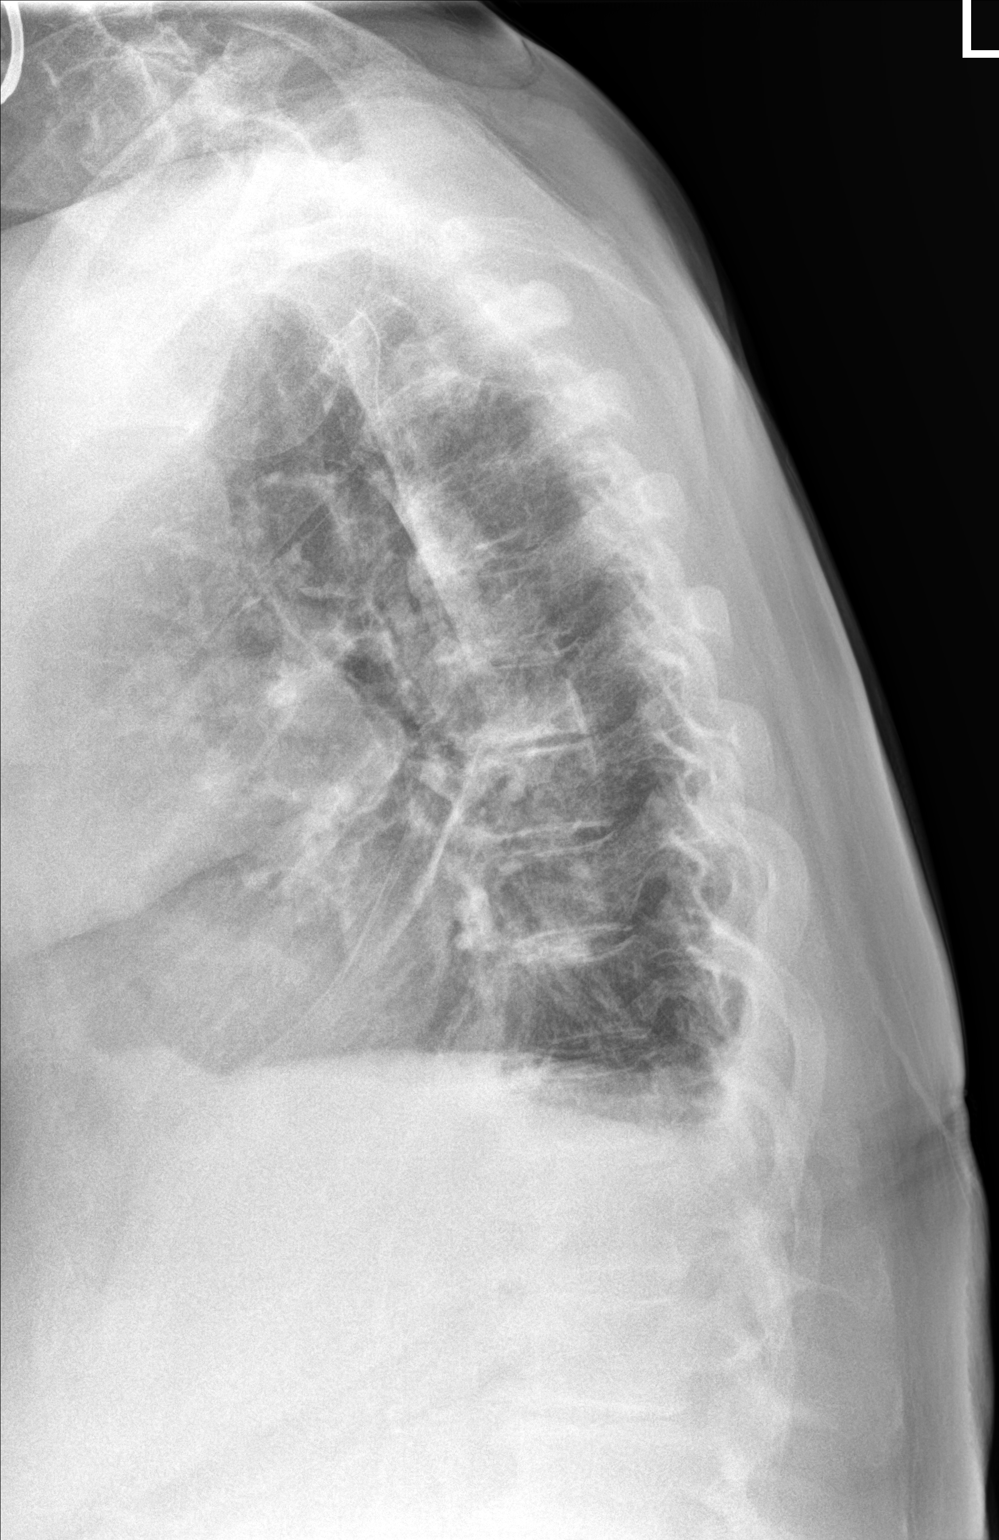

[3 of 3 positions shown; findings below may reference images not displayed]

FINDINGS: No consolidation or mass. Mild blunting of both costophrenic angles
may reflect trace pleural effusions. Mild cardiomegaly. Calcified
plaque along the aortic arch.
IMPRESSION: Probable trace pleural effusions.  Mild cardiomegaly.
